# Patient Record
Sex: Female | Born: 1995 | Race: Black or African American | Hispanic: No | Marital: Single | Smoking: Current every day smoker
Health system: Southern US, Community
[De-identification: ages and names within clinical notes are randomized; demographics above are authoritative.]

## PROBLEM LIST (undated history)

## (undated) ENCOUNTER — Inpatient Hospital Stay (HOSPITAL_COMMUNITY): Payer: Medicaid Other

## (undated) DIAGNOSIS — D649 Anemia, unspecified: Secondary | ICD-10-CM

## (undated) DIAGNOSIS — D563 Thalassemia minor: Secondary | ICD-10-CM

## (undated) DIAGNOSIS — A599 Trichomoniasis, unspecified: Secondary | ICD-10-CM

## (undated) DIAGNOSIS — R569 Unspecified convulsions: Secondary | ICD-10-CM

## (undated) DIAGNOSIS — Z803 Family history of malignant neoplasm of breast: Secondary | ICD-10-CM

## (undated) DIAGNOSIS — F1210 Cannabis abuse, uncomplicated: Secondary | ICD-10-CM

## (undated) DIAGNOSIS — A749 Chlamydial infection, unspecified: Secondary | ICD-10-CM

## (undated) HISTORY — DX: Thalassemia minor: D56.3

## (undated) HISTORY — DX: Family history of malignant neoplasm of breast: Z80.3

## (undated) HISTORY — PX: NO PAST SURGERIES: SHX2092

---

## 2015-10-27 ENCOUNTER — Emergency Department (HOSPITAL_COMMUNITY)
Admission: EM | Admit: 2015-10-27 | Discharge: 2015-10-27 | Disposition: A | Discharge status: Home / Self Care | Payer: Medicaid Other | Attending: Emergency Medicine | Admitting: Emergency Medicine

## 2015-10-27 ENCOUNTER — Encounter (HOSPITAL_COMMUNITY): Payer: Self-pay | Admitting: *Deleted

## 2015-10-27 DIAGNOSIS — Z5321 Procedure and treatment not carried out due to patient leaving prior to being seen by health care provider: Secondary | ICD-10-CM | POA: Diagnosis not present

## 2015-10-27 DIAGNOSIS — F439 Reaction to severe stress, unspecified: Secondary | ICD-10-CM | POA: Insufficient documentation

## 2015-10-27 DIAGNOSIS — F17210 Nicotine dependence, cigarettes, uncomplicated: Secondary | ICD-10-CM | POA: Diagnosis not present

## 2015-10-27 LAB — COMPREHENSIVE METABOLIC PANEL
ALBUMIN: 3.8 g/dL (ref 3.5–5.0)
ALT: 16 U/L (ref 14–54)
ANION GAP: 6 (ref 5–15)
AST: 20 U/L (ref 15–41)
Alkaline Phosphatase: 35 U/L — ABNORMAL LOW (ref 38–126)
BUN: <5 mg/dL — ABNORMAL LOW (ref 6–20)
CALCIUM: 9.2 mg/dL (ref 8.9–10.3)
CO2: 24 mmol/L (ref 22–32)
Chloride: 108 mmol/L (ref 101–111)
Creatinine, Ser: 0.75 mg/dL (ref 0.44–1.00)
GFR calc non Af Amer: >60 mL/min (ref >60)
GLUCOSE: 89 mg/dL (ref 65–99)
POTASSIUM: 3.8 mmol/L (ref 3.5–5.1)
SODIUM: 138 mmol/L (ref 135–145)
TOTAL PROTEIN: 5.9 g/dL — AB (ref 6.5–8.1)
Total Bilirubin: 1.1 mg/dL (ref 0.3–1.2)

## 2015-10-27 LAB — CBC
HEMATOCRIT: 38.3 % (ref 36.0–46.0)
HEMOGLOBIN: 12.3 g/dL (ref 12.0–15.0)
MCH: 22.7 pg — AB (ref 26.0–34.0)
MCHC: 32.1 g/dL (ref 30.0–36.0)
MCV: 70.5 fL — ABNORMAL LOW (ref 78.0–100.0)
Platelets: 239 10*3/uL (ref 150–400)
RBC: 5.43 MIL/uL — AB (ref 3.87–5.11)
RDW: 15.8 % — AB (ref 11.5–15.5)
WBC: 4.9 10*3/uL (ref 4.0–10.5)

## 2015-10-27 LAB — I-STAT BETA HCG BLOOD, ED (MC, WL, AP ONLY)

## 2015-10-27 NOTE — ED Triage Notes (Signed)
Pt states she might be pregnant and stressed. Pt states she has the IUD but states it was suppose to be taken in February. Pt is c/o lower pelvic since Sunday night.

## 2015-10-27 NOTE — ED Notes (Signed)
Pt came to nurse first to advise she is leaving.  Advised to return if symptoms worsens or no improvement.

## 2015-12-21 ENCOUNTER — Emergency Department (HOSPITAL_COMMUNITY)
Admission: EM | Admit: 2015-12-21 | Discharge: 2015-12-21 | Disposition: A | Discharge status: Home / Self Care | Payer: Medicaid Other | Attending: Emergency Medicine | Admitting: Emergency Medicine

## 2015-12-21 ENCOUNTER — Encounter (HOSPITAL_COMMUNITY): Payer: Self-pay | Admitting: Emergency Medicine

## 2015-12-21 DIAGNOSIS — K920 Hematemesis: Secondary | ICD-10-CM

## 2015-12-21 DIAGNOSIS — K226 Gastro-esophageal laceration-hemorrhage syndrome: Secondary | ICD-10-CM | POA: Insufficient documentation

## 2015-12-21 DIAGNOSIS — R109 Unspecified abdominal pain: Secondary | ICD-10-CM | POA: Diagnosis not present

## 2015-12-21 LAB — URINALYSIS, ROUTINE W REFLEX MICROSCOPIC
BILIRUBIN URINE: NEGATIVE
Glucose, UA: NEGATIVE mg/dL
HGB URINE DIPSTICK: NEGATIVE
Ketones, ur: NEGATIVE mg/dL
Nitrite: NEGATIVE
PH: 7 (ref 5.0–8.0)
Protein, ur: NEGATIVE mg/dL
SPECIFIC GRAVITY, URINE: 1.029 (ref 1.005–1.030)

## 2015-12-21 LAB — COMPREHENSIVE METABOLIC PANEL
ALBUMIN: 3.6 g/dL (ref 3.5–5.0)
ALK PHOS: 43 U/L (ref 38–126)
ALT: 10 U/L — AB (ref 14–54)
AST: 19 U/L (ref 15–41)
Anion gap: 2 — ABNORMAL LOW (ref 5–15)
BUN: 9 mg/dL (ref 6–20)
CALCIUM: 8.5 mg/dL — AB (ref 8.9–10.3)
CO2: 26 mmol/L (ref 22–32)
CREATININE: 0.77 mg/dL (ref 0.44–1.00)
Chloride: 108 mmol/L (ref 101–111)
GFR calc Af Amer: >60 mL/min (ref >60)
GFR calc non Af Amer: >60 mL/min (ref >60)
GLUCOSE: 84 mg/dL (ref 65–99)
Potassium: 4 mmol/L (ref 3.5–5.1)
SODIUM: 136 mmol/L (ref 135–145)
Total Bilirubin: 0.5 mg/dL (ref 0.3–1.2)
Total Protein: 5.9 g/dL — ABNORMAL LOW (ref 6.5–8.1)

## 2015-12-21 LAB — URINE MICROSCOPIC-ADD ON: RBC / HPF: NONE SEEN RBC/hpf (ref 0–5)

## 2015-12-21 LAB — CBC
HCT: 36.8 % (ref 36.0–46.0)
HEMOGLOBIN: 12.2 g/dL (ref 12.0–15.0)
MCH: 22.9 pg — AB (ref 26.0–34.0)
MCHC: 33.2 g/dL (ref 30.0–36.0)
MCV: 69.2 fL — ABNORMAL LOW (ref 78.0–100.0)
Platelets: 291 10*3/uL (ref 150–400)
RBC: 5.32 MIL/uL — AB (ref 3.87–5.11)
RDW: 15.7 % — ABNORMAL HIGH (ref 11.5–15.5)
WBC: 8.4 10*3/uL (ref 4.0–10.5)

## 2015-12-21 LAB — LIPASE, BLOOD: Lipase: 33 U/L (ref 11–51)

## 2015-12-21 LAB — POC URINE PREG, ED: Preg Test, Ur: NEGATIVE

## 2015-12-21 MED ORDER — TRAMADOL HCL 50 MG PO TABS: 50.0000 mg | ORAL_TABLET | Freq: Four times a day (QID) | ORAL | 0 refills | Status: DC | PRN

## 2015-12-21 MED ORDER — OXYCODONE-ACETAMINOPHEN 5-325 MG PO TABS
1.0000 | ORAL_TABLET | Freq: Once | ORAL | Status: AC
  Administered 2015-12-21: 1 via ORAL
  Filled 2015-12-21: qty 1

## 2015-12-21 MED ORDER — ONDANSETRON HCL 4 MG PO TABS: 4.0000 mg | ORAL_TABLET | Freq: Four times a day (QID) | ORAL | 0 refills | Status: DC

## 2015-12-21 MED ORDER — ONDANSETRON 8 MG PO TBDP
8.0000 mg | ORAL_TABLET | Freq: Once | ORAL | Status: AC
  Administered 2015-12-21: 8 mg via ORAL
  Filled 2015-12-21: qty 1

## 2015-12-21 NOTE — ED Provider Notes (Signed)
[facility]-EMERGENCY DEPT Provider Note   CSN: [id number] Arrival date & time: 12/21/15  0155  By signing my name below, I, Alexis [name], attest that this documentation has been prepared under the direction and in the presence of Alexis Creasehristopher J Kolter Reaver, MD . Electronically Signed: Majel HomerPeyton [name], Scribe. 12/21/2015. 2:12 AM.  History   Chief Complaint Chief Complaint  Patient presents with  . Hematemesis  . Possible Pregnancy   The history is provided by the patient. No language interpreter was used.   HPI Comments: Alexis [name] is a 20 y.o. female who presents to the Emergency Department complaining of sudden onset, nausea and hematemesis that began at ~2 hours PTA. Pt reports she "got scared" when she saw the color of her vomit and tasted blood in her mouth so she decided to visit the ED. She also notes she has an IUD that is "due to come out;" however, she has been unable to have it removed due to a change in her insurance. She denies melena and hx of ulcers or GERD.  History reviewed. No pertinent past medical history.  There are no active problems to display for this patient.  History reviewed. No pertinent surgical history.  OB History    No data available     Home Medications    Prior to Admission medications   Medication Sig Start Date End Date Taking? Authorizing Provider  ondansetron (ZOFRAN) 4 MG tablet Take 1 tablet (4 mg total) by mouth every 6 (six) hours. 12/21/15   Alexis Creasehristopher J Jordany Russett, MD    Family History History reviewed. No pertinent family history.  Social History Social History  Substance Use Topics  . Smoking status: Current Some Day Smoker    Types: Cigarettes  . Smokeless tobacco: Never Used  . Alcohol use No   Allergies   Review of patient's allergies indicates no known allergies.   Review of Systems Review of Systems  Gastrointestinal: Positive for nausea.       +hematemesis   Physical Exam Updated Vital Signs BP 109/78   Pulse 86   Temp  97.8 F (36.6 C) (Oral)   Resp 18   SpO2 98%   Physical Exam  Constitutional: She is oriented to person, place, and time. She appears well-developed and well-nourished. No distress.  HENT:  Head: Normocephalic and atraumatic.  Right Ear: Hearing normal.  Left Ear: Hearing normal.  Nose: Nose normal.  Mouth/Throat: Oropharynx is clear and moist and mucous membranes are normal.  Eyes: Conjunctivae and EOM are normal. Pupils are equal, round, and reactive to light.  Neck: Normal range of motion. Neck supple.  Cardiovascular: Regular rhythm, S1 normal and S2 normal.  Exam reveals no gallop and no friction rub.   No murmur heard. Pulmonary/Chest: Effort normal and breath sounds normal. No respiratory distress. She exhibits no tenderness.  Abdominal: Soft. Normal appearance and bowel sounds are normal. There is no hepatosplenomegaly. There is no tenderness. There is no rebound, no guarding, no tenderness at McBurney's point and negative Murphy's sign. No hernia.  Musculoskeletal: Normal range of motion.  Neurological: She is alert and oriented to person, place, and time. She has normal strength. No cranial nerve deficit or sensory deficit. Coordination normal. GCS eye subscore is 4. GCS verbal subscore is 5. GCS motor subscore is 6.  Skin: Skin is warm, dry and intact. No rash noted. No cyanosis.  Psychiatric: She has a normal mood and affect. Her speech is normal and behavior is normal. Thought content normal.  Nursing  note and vitals reviewed.  ED Treatments / Results  Labs (all labs ordered are listed, but only abnormal results are displayed) Labs Reviewed  CBC - Abnormal; Notable for the following:       Result Value   RBC 5.32 (*)    MCV 69.2 (*)    MCH 22.9 (*)    RDW 15.7 (*)    All other components within normal limits  COMPREHENSIVE METABOLIC PANEL - Abnormal; Notable for the following:    Calcium 8.5 (*)    Total Protein 5.9 (*)    ALT 10 (*)    Anion gap 2 (*)    All  other components within normal limits  URINALYSIS, ROUTINE W REFLEX MICROSCOPIC (NOT AT [facility]) - Abnormal; Notable for the following:    APPearance CLOUDY (*)    Leukocytes, UA MODERATE (*)    All other components within normal limits  URINE MICROSCOPIC-ADD ON - Abnormal; Notable for the following:    Squamous Epithelial / LPF 6-30 (*)    Bacteria, UA FEW (*)    All other components within normal limits  URINE CULTURE  LIPASE, BLOOD  POC URINE PREG, ED    EKG  EKG Interpretation None       Radiology No results found.  Procedures Procedures (including critical care time)  Medications Ordered in ED Medications  ondansetron (ZOFRAN-ODT) disintegrating tablet 8 mg (8 mg Oral Given 12/21/15 0222)    DIAGNOSTIC STUDIES:  Oxygen Saturation is 98% on RA, normal by my interpretation.    COORDINATION OF CARE:  2:10 AM Discussed treatment plan with pt at bedside and pt agreed to plan.  Initial Impression / Assessment and Plan / ED Course  I have reviewed the triage vital signs and the nursing notes.  Pertinent labs & imaging results that were available during my care of the patient were reviewed by me and considered in my medical decision making (see chart for details).  Clinical Course    Patient presents to the emergency department for evaluation of hematemesis. Patient reports that she came acutely nauseated and had vomiting this evening. She reports red blood within the vomit. She has not had any coffee-ground emesis, denies melanotic stools. Patient complaining of some upper abdominal discomfort, but abdominal exam was benign and nontender. Patient mainly concerned about the fact that she was vomiting blood. Presentation is consistent with Mallory-Weiss tear. No further hematemesis here in the ER. Hemoglobin stable. No elevation of BUN to suggest gastric blood. Patient reassured, no further treatment necessary. Urinalysis appears contaminated, obtain urine culture. Treat  symptomatically with antiemetics.  Patient also expressing concern about an IUD that is in place. She reports that has been in place for 6 years. She would like to have the IUD removed. There is to be any acute complications at this time. Will refer to OB/GYN faculty practice for removal of IUD and further gynecologic evaluation.  I personally performed the services described in this documentation, which was scribed in my presence. The recorded information has been reviewed and is accurate.   Final Clinical Impressions(s) / ED Diagnoses   Final diagnoses:  Hematemesis with nausea  Mallory-Weiss tear    New Prescriptions New Prescriptions   ONDANSETRON (ZOFRAN) 4 MG TABLET    Take 1 tablet (4 mg total) by mouth every 6 (six) hours.     Alexis Creasehristopher J Shakala Marlatt, MD 12/21/15 [phone]

## 2015-12-21 NOTE — ED Triage Notes (Signed)
Per EMS ,pt. From home with complaint of  Nausea and Hematemesis which started 1130 last night. Pt. Stated that also experienced abdominal cramping . Pt. Stated that she had a home pregnancy test sometime in last week of Sept. and was  POSITIVE for pregnancy. Pt. Never seen an OB. Pt. Stated that she has an IUD x 6 years now. Denied bleeding. Denied cough ,nor fever.

## 2015-12-21 NOTE — ED Notes (Signed)
MD at bedside. 

## 2015-12-21 NOTE — ED Notes (Signed)
Bed: [id number] Expected date:  Expected time:  Means of arrival:  Comments: 20 yo F/ Vomiting

## 2015-12-22 LAB — URINE CULTURE

## 2016-02-22 ENCOUNTER — Inpatient Hospital Stay (HOSPITAL_COMMUNITY)
Admission: AD | Admit: 2016-02-22 | Discharge: 2016-02-23 | Disposition: A | Discharge status: Home / Self Care | Payer: Medicaid Other | Source: Ambulatory Visit | Attending: Obstetrics and Gynecology | Admitting: Obstetrics and Gynecology

## 2016-02-22 DIAGNOSIS — N939 Abnormal uterine and vaginal bleeding, unspecified: Secondary | ICD-10-CM | POA: Diagnosis present

## 2016-02-22 DIAGNOSIS — A599 Trichomoniasis, unspecified: Secondary | ICD-10-CM | POA: Insufficient documentation

## 2016-02-22 DIAGNOSIS — Z79899 Other long term (current) drug therapy: Secondary | ICD-10-CM | POA: Diagnosis not present

## 2016-02-22 DIAGNOSIS — F17210 Nicotine dependence, cigarettes, uncomplicated: Secondary | ICD-10-CM | POA: Insufficient documentation

## 2016-02-22 LAB — POCT PREGNANCY, URINE: Preg Test, Ur: NEGATIVE

## 2016-02-22 LAB — URINALYSIS, ROUTINE W REFLEX MICROSCOPIC
Bacteria, UA: NONE SEEN
Bilirubin Urine: NEGATIVE
GLUCOSE, UA: NEGATIVE mg/dL
HGB URINE DIPSTICK: NEGATIVE
Ketones, ur: NEGATIVE mg/dL
NITRITE: NEGATIVE
PH: 5 (ref 5.0–8.0)
PROTEIN: NEGATIVE mg/dL
Specific Gravity, Urine: 1.021 (ref 1.005–1.030)

## 2016-02-22 NOTE — MAU Note (Signed)
Presents with pain that has lasted since 02/04/2016 that has worsened this past weekend.  States she was experiencing a large amount of bleeding between 12/14-12/28 with large clots but is not currently experiencing any bleeding.  Reports she takes 2 pregnancy tests every other week since October that have all been positive.  States she continued to have a menstrual period with her previous pregnancy and has a history of previous miscarriage.  Pain reported in her lower abdomen, cervix, and lower back pain that is intermittent.  Last sexual intercourse was approximately 1 month ago.

## 2016-02-23 ENCOUNTER — Encounter: Payer: Self-pay | Admitting: Obstetrics and Gynecology

## 2016-02-23 ENCOUNTER — Other Ambulatory Visit: Payer: Self-pay | Admitting: Obstetrics and Gynecology

## 2016-02-23 DIAGNOSIS — A599 Trichomoniasis, unspecified: Secondary | ICD-10-CM | POA: Diagnosis not present

## 2016-02-23 DIAGNOSIS — N730 Acute parametritis and pelvic cellulitis: Secondary | ICD-10-CM | POA: Insufficient documentation

## 2016-02-23 LAB — WET PREP, GENITAL
CLUE CELLS WET PREP: NONE SEEN
SPERM: NONE SEEN
Yeast Wet Prep HPF POC: NONE SEEN

## 2016-02-23 LAB — GC/CHLAMYDIA PROBE AMP (~~LOC~~) NOT AT ARMC
CHLAMYDIA, DNA PROBE: POSITIVE — AB
Neisseria Gonorrhea: NEGATIVE

## 2016-02-23 MED ORDER — IBUPROFEN 600 MG PO TABS
600.0000 mg | ORAL_TABLET | Freq: Once | ORAL | Status: AC
  Administered 2016-02-23: 600 mg via ORAL
  Filled 2016-02-23: qty 1

## 2016-02-23 MED ORDER — METRONIDAZOLE 500 MG PO TABS
2000.0000 mg | ORAL_TABLET | Freq: Once | ORAL | Status: AC
  Administered 2016-02-23: 2000 mg via ORAL
  Filled 2016-02-23: qty 4

## 2016-02-23 MED ORDER — AZITHROMYCIN 500 MG PO TABS: 1000.0000 mg | ORAL_TABLET | Freq: Once | ORAL | 0 refills | Status: AC

## 2016-02-23 NOTE — Discharge Instructions (Signed)
Trichomoniasis °Trichomoniasis is an infection caused by an organism called Trichomonas. The infection can affect both women and men. In women, the outer female genitalia and the vagina are affected. In men, the penis is mainly affected, but the prostate and other reproductive organs can also be involved. Trichomoniasis is a sexually transmitted infection (STI) and is most often passed to another person through sexual contact.  °RISK FACTORS °Having unprotected sexual intercourse. °Having sexual intercourse with an infected partner. °SIGNS AND SYMPTOMS  °Symptoms of trichomoniasis in women include: °Abnormal gray-green frothy vaginal discharge. °Itching and irritation of the vagina. °Itching and irritation of the area outside the vagina. °Symptoms of trichomoniasis in men include:  °Penile discharge with or without pain. °Pain during urination. This results from inflammation of the urethra. °DIAGNOSIS  °Trichomoniasis may be found during a Pap test or physical exam. Your health care provider may use one of the following methods to help diagnose this infection: °Testing the pH of the vagina with a test tape. °Using a vaginal swab test that checks for the Trichomonas organism. A test is available that provides results within a few minutes. °Examining a urine sample. °Testing vaginal secretions. °Your health care provider may test you for other STIs, including HIV. °TREATMENT  °You may be given medicine to fight the infection. Women should inform their health care provider if they could be or are pregnant. Some medicines used to treat the infection should not be taken during pregnancy. °Your health care provider may recommend over-the-counter medicines or creams to decrease itching or irritation. °Your sexual partner will need to be treated if infected. °Your health care provider may test you for infection again 3 months after treatment. °HOME CARE INSTRUCTIONS  °Take medicines only as directed by your health care  provider. °Take over-the-counter medicine for itching or irritation as directed by your health care provider. °Do not have sexual intercourse while you have the infection. °Women should not douche or wear tampons while they have the infection. °Discuss your infection with your partner. Your partner may have gotten the infection from you, or you may have gotten it from your partner. °Have your sex partner get examined and treated if necessary. °Practice safe, informed, and protected sex. °See your health care provider for other STI testing. °SEEK MEDICAL CARE IF:  °You still have symptoms after you finish your medicine. °You develop abdominal pain. °You have pain when you urinate. °You have bleeding after sexual intercourse. °You develop a rash. °Your medicine makes you sick or makes you throw up (vomit). °MAKE SURE YOU: °Understand these instructions. °Will watch your condition. °Will get help right away if you are not doing well or get worse. °This information is not intended to replace advice given to you by your health care provider. Make sure you discuss any questions you have with your health care provider. °Document Released: 08/03/2000 Document Revised: 02/28/2014 Document Reviewed: 11/19/2012 °Elsevier Interactive Patient Education © 2017 [employer] °  °

## 2016-02-23 NOTE — MAU Provider Note (Signed)
  History     CSN: [id number]  Arrival date and time: 02/22/16 2250   First Provider Initiated Contact with Patient 02/23/16 0002      Chief Complaint  Patient presents with  . Possible Pregnancy  . Vaginal Bleeding  . Abdominal Pain   Patient is a 21 year old G2 P1 who presents with concerns for miscarriage. She reports she has taken pregnancy tests every 2 weeks at home and they've been positive. She reports she started bleeding 2-1/2 weeks ago with some mild spotting. Since then has become heavy where she was passing clots and now she is having no bleeding. She reports diffuse abdominal pain. She reports his worst in the left lower quadrant. She reports normal bowel movement this afternoon that did not require straining. She reports last sexual contact was with a known partner approximately 1 month ago without protection.    OB History    No data available      No past medical history on file.  No past surgical history on file.  No family history on file.  Social History  Substance Use Topics  . Smoking status: Current Some Day Smoker    Types: Cigarettes  . Smokeless tobacco: Never Used  . Alcohol use No    Allergies: No Known Allergies  Prescriptions Prior to Admission  Medication Sig Dispense Refill Last Dose  . ondansetron (ZOFRAN) 4 MG tablet Take 1 tablet (4 mg total) by mouth every 6 (six) hours. 12 tablet 0   . traMADol (ULTRAM) 50 MG tablet Take 1 tablet (50 mg total) by mouth every 6 (six) hours as needed. 15 tablet 0     Review of Systems  Constitutional: Negative for chills and fever.  Eyes: Negative for blurred vision and double vision.  Respiratory: Negative for cough and shortness of breath.   Cardiovascular: Negative for chest pain and palpitations.  Gastrointestinal: Positive for abdominal pain. Negative for constipation, diarrhea, heartburn, nausea and vomiting.  Genitourinary: Negative for dysuria, frequency and urgency.  Musculoskeletal:  Positive for back pain. Negative for myalgias and neck pain.  Neurological: Negative for dizziness and headaches.   Physical Exam   Blood pressure 113/62, pulse 90, temperature 98.4 F (36.9 C), temperature source Oral, resp. rate 18, height 5\' 4"  (1.626 m), weight 156 lb (70.8 kg).  Physical Exam  Constitutional: She is oriented to person, place, and time. She appears well-developed and well-nourished.  HENT:  Head: Atraumatic.  Cardiovascular: Normal rate and intact distal pulses.   Respiratory: Effort normal. No respiratory distress.  GI: Soft. Bowel sounds are normal. She exhibits no distension. There is no rebound and no guarding.  Diffuse abdominal tenderness worse in the left lower quadrant  Genitourinary:  Genitourinary Comments: Cervical motion tenderness, copious white thin discharge healthy pink vaginal mucosa and normal-appearing cervix  Musculoskeletal: Normal range of motion. She exhibits no edema.  Neurological: She is alert and oriented to person, place, and time.    MAU Course  Procedures  MDM In MA U patient underwent exam and was found to have some minimal cervical motion tenderness. Wet prep as well as gonorrhea and chlamydia were collected. Wet prep revealed Trichomonas and patient revealed 2 g of Flagyl in MA U.  Assessment and Plan  #1: Trichomonas, informed patient that she had STD Trichomonas. Treated with Flagyl in MA U.  Ernestina Pennaicholas Cynai Skeens 02/23/2016, 12:17 AM

## 2016-02-24 ENCOUNTER — Telehealth (HOSPITAL_COMMUNITY): Payer: Self-pay | Admitting: *Deleted

## 2016-02-24 NOTE — Telephone Encounter (Signed)
Pt. Reached to give positive Chlaymidis results.  Perscription called to [facility] [address]. Pt. Notified to refrain from intercourse x2 weeks.  Health dept. Form completed.

## 2016-03-11 ENCOUNTER — Encounter (HOSPITAL_COMMUNITY): Payer: Self-pay | Admitting: *Deleted

## 2016-03-11 ENCOUNTER — Ambulatory Visit (HOSPITAL_COMMUNITY)
Admission: EM | Admit: 2016-03-11 | Discharge: 2016-03-11 | Disposition: A | Discharge status: Home / Self Care | Payer: Medicaid Other | Attending: Family Medicine | Admitting: Family Medicine

## 2016-03-11 ENCOUNTER — Ambulatory Visit (INDEPENDENT_AMBULATORY_CARE_PROVIDER_SITE_OTHER): Payer: Medicaid Other

## 2016-03-11 DIAGNOSIS — S6000XA Contusion of unspecified finger without damage to nail, initial encounter: Secondary | ICD-10-CM | POA: Diagnosis not present

## 2016-03-11 DIAGNOSIS — G43001 Migraine without aura, not intractable, with status migrainosus: Secondary | ICD-10-CM

## 2016-03-11 MED ORDER — KETOROLAC TROMETHAMINE 60 MG/2ML IM SOLN
INTRAMUSCULAR | Status: AC
  Filled 2016-03-11: qty 2

## 2016-03-11 MED ORDER — NAPROXEN 500 MG PO TABS: 500.0000 mg | ORAL_TABLET | Freq: Two times a day (BID) | ORAL | 0 refills | Status: DC

## 2016-03-11 MED ORDER — KETOROLAC TROMETHAMINE 60 MG/2ML IM SOLN
60.0000 mg | Freq: Once | INTRAMUSCULAR | Status: AC
  Administered 2016-03-11: 60 mg via INTRAMUSCULAR

## 2016-03-11 NOTE — ED Triage Notes (Signed)
Patient reports falling yesterday and injuring right pinky finger, states after that she started having severe migraine headache. Patient denies hx of migraines. Patient with noted swelling at proximal pinky joint. No relief with OTC pain medications.

## 2016-03-11 NOTE — ED Provider Notes (Signed)
CSN: [id number]     Arrival date & time 03/11/16  1430 History   First MD Initiated Contact with Patient 03/11/16 1618     Chief Complaint  Patient presents with  . Migraine  . Fall  . Hand Pain   (Consider location/radiation/quality/duration/timing/severity/associated sxs/prior Treatment) Patient c/o right pinky discomfort after falling and injuring right pinky finger.  Patient is c/o headache.   The history is provided by the patient.  Migraine  This is a new problem. The problem occurs constantly. The problem has not changed since onset.Associated symptoms include headaches. Nothing aggravates the symptoms. Nothing relieves the symptoms. She has tried nothing for the symptoms.  Fall  Associated symptoms include headaches.  Hand Pain  Associated symptoms include headaches.    History reviewed. No pertinent past medical history. History reviewed. No pertinent surgical history. History reviewed. No pertinent family history. Social History  Substance Use Topics  . Smoking status: Current Some Day Smoker    Types: Cigarettes  . Smokeless tobacco: Never Used  . Alcohol use No   OB History    No data available     Review of Systems  Constitutional: Negative.   HENT: Negative.   Eyes: Negative.   Respiratory: Negative.   Cardiovascular: Negative.   Genitourinary: Negative.   Musculoskeletal: Positive for arthralgias.  Neurological: Positive for headaches.    Allergies  Patient has no known allergies.  Home Medications   Prior to Admission medications   Medication Sig Start Date End Date Taking? Authorizing Provider  naproxen (NAPROSYN) 500 MG tablet Take 1 tablet (500 mg total) by mouth 2 (two) times daily with a meal. 03/11/16   Deatra CanterWilliam J Oxford, FNP  ondansetron (ZOFRAN) 4 MG tablet Take 1 tablet (4 mg total) by mouth every 6 (six) hours. 12/21/15   Gilda Creasehristopher J Pollina, MD  traMADol (ULTRAM) 50 MG tablet Take 1 tablet (50 mg total) by mouth every 6 (six) hours as  needed. 12/21/15   Gilda Creasehristopher J Pollina, MD   Meds Ordered and Administered this Visit   Medications  ketorolac (TORADOL) injection 60 mg (60 mg Intramuscular Given 03/11/16 1644)    BP 120/71 (BP Location: Right Arm)   Pulse 78   Temp 98 F (36.7 C) (Oral)   Resp 19   LMP 03/09/2016   SpO2 100%  No data found.   Physical Exam  Constitutional: She is oriented to person, place, and time. She appears well-developed and well-nourished.  HENT:  Head: Normocephalic and atraumatic.  Eyes: Conjunctivae and EOM are normal. Pupils are equal, round, and reactive to light.  Neck: Normal range of motion. Neck supple.  Cardiovascular: Normal rate, regular rhythm and normal heart sounds.   Pulmonary/Chest: Effort normal and breath sounds normal.  Musculoskeletal: She exhibits tenderness.  Right pinky finger with swelling and TTP MIP joint.  Neurological: She is alert and oriented to person, place, and time.  Nursing note and vitals reviewed.   Urgent Care Course     Procedures (including critical care time)  Labs Review Labs Reviewed - No data to display  Imaging Review Dg Finger Little Right  Result Date: 03/11/2016 CLINICAL DATA:  Slipped on ice and injured finger yesterday. Pain and swelling. EXAM: RIGHT LITTLE FINGER 2+V COMPARISON:  None. FINDINGS: There is no evidence of fracture or dislocation. There is no evidence of arthropathy or other focal bone abnormality. Soft tissues are unremarkable. IMPRESSION: Negative. Electronically Signed   By: Awilda Metroourtnay  Bloomer M.D.   On: 03/11/2016 16:33  Visual Acuity Review  Right Eye Distance:   Left Eye Distance:   Bilateral Distance:    Right Eye Near:   Left Eye Near:    Bilateral Near:         MDM   1. Migraine without aura and with status migrainosus, not intractable   2. Contusion of pinky finger  Buddy tape Naprosyn 500mg  one po bid x 10 days Toradol 60mg  IM       Deatra Canter, FNP 03/11/16 1954

## 2016-03-21 ENCOUNTER — Encounter (HOSPITAL_COMMUNITY): Payer: Self-pay | Admitting: Emergency Medicine

## 2016-03-21 DIAGNOSIS — F17210 Nicotine dependence, cigarettes, uncomplicated: Secondary | ICD-10-CM | POA: Diagnosis not present

## 2016-03-21 DIAGNOSIS — Z5321 Procedure and treatment not carried out due to patient leaving prior to being seen by health care provider: Secondary | ICD-10-CM | POA: Insufficient documentation

## 2016-03-21 DIAGNOSIS — R51 Headache: Secondary | ICD-10-CM | POA: Insufficient documentation

## 2016-03-21 NOTE — ED Triage Notes (Signed)
Patient reports headache onset last week unrelieved by OTC pain medication , denies head injury , no emesis or photophobia .

## 2016-03-22 ENCOUNTER — Emergency Department (HOSPITAL_COMMUNITY)
Admission: EM | Admit: 2016-03-22 | Discharge: 2016-03-22 | Disposition: A | Discharge status: Home / Self Care | Payer: Medicaid Other | Attending: Dermatology | Admitting: Dermatology

## 2016-05-14 ENCOUNTER — Encounter (HOSPITAL_COMMUNITY): Payer: Self-pay | Admitting: Emergency Medicine

## 2016-05-14 ENCOUNTER — Ambulatory Visit (HOSPITAL_COMMUNITY)
Admission: EM | Admit: 2016-05-14 | Discharge: 2016-05-14 | Disposition: A | Discharge status: Home / Self Care | Payer: Medicaid Other | Attending: Internal Medicine | Admitting: Internal Medicine

## 2016-05-14 DIAGNOSIS — R109 Unspecified abdominal pain: Secondary | ICD-10-CM | POA: Diagnosis not present

## 2016-05-14 DIAGNOSIS — F17210 Nicotine dependence, cigarettes, uncomplicated: Secondary | ICD-10-CM | POA: Diagnosis not present

## 2016-05-14 DIAGNOSIS — N898 Other specified noninflammatory disorders of vagina: Secondary | ICD-10-CM | POA: Diagnosis not present

## 2016-05-14 DIAGNOSIS — Z79899 Other long term (current) drug therapy: Secondary | ICD-10-CM | POA: Diagnosis not present

## 2016-05-14 DIAGNOSIS — Z202 Contact with and (suspected) exposure to infections with a predominantly sexual mode of transmission: Secondary | ICD-10-CM | POA: Insufficient documentation

## 2016-05-14 DIAGNOSIS — Z3202 Encounter for pregnancy test, result negative: Secondary | ICD-10-CM

## 2016-05-14 DIAGNOSIS — N941 Unspecified dyspareunia: Secondary | ICD-10-CM | POA: Diagnosis not present

## 2016-05-14 LAB — POCT URINALYSIS DIP (DEVICE)
Bilirubin Urine: NEGATIVE
Glucose, UA: NEGATIVE mg/dL
HGB URINE DIPSTICK: NEGATIVE
KETONES UR: NEGATIVE mg/dL
Leukocytes, UA: NEGATIVE
Nitrite: NEGATIVE
PROTEIN: 30 mg/dL — AB
SPECIFIC GRAVITY, URINE: 1.025 (ref 1.005–1.030)
Urobilinogen, UA: >=8 mg/dL (ref 0.0–1.0)
pH: 6.5 (ref 5.0–8.0)

## 2016-05-14 LAB — POCT PREGNANCY, URINE: Preg Test, Ur: NEGATIVE

## 2016-05-14 MED ORDER — AZITHROMYCIN 250 MG PO TABS
1000.0000 mg | ORAL_TABLET | Freq: Once | ORAL | Status: AC
  Administered 2016-05-14: 1000 mg via ORAL

## 2016-05-14 MED ORDER — CEFTRIAXONE SODIUM 250 MG IJ SOLR
250.0000 mg | Freq: Once | INTRAMUSCULAR | Status: AC
  Administered 2016-05-14: 250 mg via INTRAMUSCULAR

## 2016-05-14 MED ORDER — LIDOCAINE HCL (PF) 1 % IJ SOLN
INTRAMUSCULAR | Status: AC
  Filled 2016-05-14: qty 2

## 2016-05-14 MED ORDER — METRONIDAZOLE 500 MG PO TABS: 500.0000 mg | ORAL_TABLET | Freq: Two times a day (BID) | ORAL | 0 refills | Status: AC

## 2016-05-14 MED ORDER — AZITHROMYCIN 250 MG PO TABS
ORAL_TABLET | ORAL | Status: AC
  Filled 2016-05-14: qty 4

## 2016-05-14 MED ORDER — CEFTRIAXONE SODIUM 250 MG IJ SOLR
INTRAMUSCULAR | Status: AC
  Filled 2016-05-14: qty 250

## 2016-05-14 NOTE — ED Notes (Signed)
Call back number verified and updated in EPIC... Adv pt to not have SI until lab results comeback neg.... Also adv pt lab results will be on MyChart; instructions given .... Pt verb understanding.   

## 2016-05-14 NOTE — ED Triage Notes (Signed)
Patient wants "check up".  Pain in low abdominal area.  Pain for 2 weeks.  Reports vaginal discharge.  Denies urinary symptoms

## 2016-05-14 NOTE — ED Provider Notes (Signed)
CSN: [id number]     Arrival date & time 05/14/16  1619 History   None    Chief Complaint  Patient presents with  . Exposure to STD   (Consider location/radiation/quality/duration/timing/severity/associated sxs/prior Treatment) 20y.o. female presents with lower abdominal pain, dyspareunia and  vaginal discharge that patient describes and silver and smells X 2 weeks. Patient states "I just want to get checked out" Patient endorses unprotected sex with a man who has unprotected sex with other women. Condition is acute in nature. Condition is made better by acute. Condition is made worse by nothing. Patient denies any treatment.  prior to there arrival at this facility. Patient denies any nausea, vomiting diarrhea, pain with urination, physical vaginal issues or fevers. Last menstrual cycle now. Patient declines pelvic exam at time of evaluation        History reviewed. No pertinent past medical history. History reviewed. No pertinent surgical history. No family history on file. Social History  Substance Use Topics  . Smoking status: Current Some Day Smoker    Types: Cigarettes  . Smokeless tobacco: Never Used  . Alcohol use No   OB History    No data available     Review of Systems  Constitutional: Negative for chills and fever.  HENT: Negative for ear pain and sore throat.   Eyes: Negative for pain and visual disturbance.  Respiratory: Negative for cough and shortness of breath.   Cardiovascular: Negative for chest pain and palpitations.  Gastrointestinal: Negative for abdominal pain and vomiting.  Genitourinary: Positive for dyspareunia and vaginal discharge. Negative for dysuria and hematuria.  Musculoskeletal: Negative for arthralgias and back pain.  Skin: Negative for color change and rash.  Neurological: Negative for seizures and syncope.  All other systems reviewed and are negative.   Allergies  Patient has no known allergies.  Home Medications   Prior to Admission  medications   Medication Sig Start Date End Date Taking? Authorizing Provider  metroNIDAZOLE (FLAGYL) 500 MG tablet Take 1 tablet (500 mg total) by mouth 2 (two) times daily. 05/14/16 05/24/16  Alene Mires, NP  naproxen (NAPROSYN) 500 MG tablet Take 1 tablet (500 mg total) by mouth 2 (two) times daily with a meal. 03/11/16   Deatra Canter, FNP  ondansetron (ZOFRAN) 4 MG tablet Take 1 tablet (4 mg total) by mouth every 6 (six) hours. 12/21/15   Gilda Crease, MD  traMADol (ULTRAM) 50 MG tablet Take 1 tablet (50 mg total) by mouth every 6 (six) hours as needed. 12/21/15   Gilda Crease, MD   Meds Ordered and Administered this Visit   Medications  cefTRIAXone (ROCEPHIN) injection 250 mg (250 mg Intramuscular Given 05/14/16 1734)  azithromycin (ZITHROMAX) tablet 1,000 mg (1,000 mg Oral Given 05/14/16 1734)    BP 116/69 (BP Location: Left Arm)   Pulse 66   Temp 98.8 F (37.1 C) (Oral)   Resp 18   LMP 05/14/2016   SpO2 100%  No data found.   Physical Exam  Constitutional: She is oriented to person, place, and time. She appears well-developed and well-nourished.  HENT:  Head: Normocephalic and atraumatic.  Eyes: Conjunctivae are normal.  Neck: Normal range of motion.  Pulmonary/Chest: Effort normal.  Neurological: She is alert and oriented to person, place, and time.  Psychiatric: She has a normal mood and affect.  Nursing note and vitals reviewed.   Urgent Care Course     Procedures (including critical care time)  Labs Review Labs Reviewed  POCT  URINALYSIS DIP (DEVICE) - Abnormal; Notable for the following:       Result Value   Protein, ur 30 (*)    All other components within normal limits  POCT PREGNANCY, URINE  URINE CYTOLOGY ANCILLARY ONLY    Imaging Review No results found.   Visual Acuity Review  Right Eye Distance:   Left Eye Distance:   Bilateral Distance:    Right Eye Near:   Left Eye Near:    Bilateral Near:         MDM    1. STD exposure       Alene MiresJennifer C Omohundro, NP 05/14/16 1750

## 2016-05-16 LAB — URINE CYTOLOGY ANCILLARY ONLY
CHLAMYDIA, DNA PROBE: NEGATIVE
Neisseria Gonorrhea: NEGATIVE
Trichomonas: NEGATIVE

## 2016-05-18 LAB — URINE CYTOLOGY ANCILLARY ONLY: Candida vaginitis: NEGATIVE

## 2016-06-02 ENCOUNTER — Ambulatory Visit (HOSPITAL_COMMUNITY)
Admission: EM | Admit: 2016-06-02 | Discharge: 2016-06-02 | Disposition: A | Discharge status: Home / Self Care | Payer: Medicaid Other | Attending: Family Medicine | Admitting: Family Medicine

## 2016-06-02 ENCOUNTER — Encounter (HOSPITAL_COMMUNITY): Payer: Self-pay | Admitting: Emergency Medicine

## 2016-06-02 DIAGNOSIS — K047 Periapical abscess without sinus: Secondary | ICD-10-CM

## 2016-06-02 DIAGNOSIS — K029 Dental caries, unspecified: Secondary | ICD-10-CM

## 2016-06-02 DIAGNOSIS — K0889 Other specified disorders of teeth and supporting structures: Secondary | ICD-10-CM | POA: Diagnosis not present

## 2016-06-02 MED ORDER — HYDROCODONE-ACETAMINOPHEN 5-325 MG PO TABS: 1.0000 | ORAL_TABLET | ORAL | 0 refills | Status: DC | PRN

## 2016-06-02 MED ORDER — AMOXICILLIN 500 MG PO CAPS: 1000.0000 mg | ORAL_CAPSULE | Freq: Two times a day (BID) | ORAL | 0 refills | Status: DC

## 2016-06-02 NOTE — ED Triage Notes (Signed)
Onset Tuesday of dental pain.  Pain is in the area where top and bottom jaw meet

## 2016-06-02 NOTE — ED Provider Notes (Signed)
CSN: [id number]     Arrival date & time 06/02/16  0957 History   First MD Initiated Contact with Patient 06/02/16 1047     Chief Complaint  Patient presents with  . Dental Pain   (Consider location/radiation/quality/duration/timing/severity/associated sxs/prior Treatment) 21 year old female complaining of toothache for 2 days. She states there is fluid draining behind her ear as well. Today's is located in the left upper jaw near the third molar. She has no tenderness. She has taken Naprosyn, ibuprofen and Tylenol which do not help.      History reviewed. No pertinent past medical history. History reviewed. No pertinent surgical history. No family history on file. Social History  Substance Use Topics  . Smoking status: Current Some Day Smoker    Types: Cigarettes  . Smokeless tobacco: Never Used  . Alcohol use No   OB History    No data available     Review of Systems  Constitutional: Negative.   HENT: Positive for dental problem. Negative for congestion and sore throat.   Eyes: Negative.   Respiratory: Negative.   Neurological: Negative.   All other systems reviewed and are negative.   Allergies  Patient has no known allergies.  Home Medications   Prior to Admission medications   Medication Sig Start Date End Date Taking? Authorizing Provider  naproxen (NAPROSYN) 500 MG tablet Take 1 tablet (500 mg total) by mouth 2 (two) times daily with a meal. 03/11/16  Yes Deatra Canter, FNP  amoxicillin (AMOXIL) 500 MG capsule Take 2 capsules (1,000 mg total) by mouth 2 (two) times daily. 06/02/16   Hayden Rasmussen, NP  HYDROcodone-acetaminophen (NORCO/VICODIN) 5-325 MG tablet Take 1 tablet by mouth every 4 (four) hours as needed. 06/02/16   Hayden Rasmussen, NP  ondansetron (ZOFRAN) 4 MG tablet Take 1 tablet (4 mg total) by mouth every 6 (six) hours. 12/21/15   Gilda Crease, MD  traMADol (ULTRAM) 50 MG tablet Take 1 tablet (50 mg total) by mouth every 6 (six) hours as needed.  12/21/15   Gilda Crease, MD   Meds Ordered and Administered this Visit  Medications - No data to display  BP 113/65 (BP Location: Right Arm)   Pulse 60   Temp 98.8 F (37.1 C) (Oral)   Resp 18   LMP 05/14/2016   SpO2 100%  No data found.   Physical Exam  Constitutional: She appears well-developed and well-nourished. No distress.  HENT:  Right TM obstructed by cerumen. Left TM appears normal. Normal light reflex, no bulging, erythema.  The left upper third molar with surrounding swelling of the gingiva. No actual abscess seen. Positive for dental tenderness and soft tissue tenderness around the tooth. In general very poor dental repair multiple eroding, cavernous teeth.  Eyes: EOM are normal.  Neck: Normal range of motion. Neck supple.  Cardiovascular: Normal rate.   Lymphadenopathy:    She has no cervical adenopathy.  Psychiatric: She has a normal mood and affect.  Nursing note and vitals reviewed.   Urgent Care Course     Procedures (including critical care time)  Labs Review Labs Reviewed - No data to display  Imaging Review No results found.   Visual Acuity Review  Right Eye Distance:   Left Eye Distance:   Bilateral Distance:    Right Eye Near:   Left Eye Near:    Bilateral Near:         MDM   1. Pain, dental   2. Infected dental caries  Take medication as directed. Call Dentist for appointment is sent is possible. Meds ordered this encounter  Medications  . amoxicillin (AMOXIL) 500 MG capsule    Sig: Take 2 capsules (1,000 mg total) by mouth 2 (two) times daily.    Dispense:  32 capsule    Refill:  0    Order Specific Question:   Supervising Provider    Answer:   Elvina Sidle [[id number]]  . HYDROcodone-acetaminophen (NORCO/VICODIN) 5-325 MG tablet    Sig: Take 1 tablet by mouth every 4 (four) hours as needed.    Dispense:  15 tablet    Refill:  0    Order Specific Question:   Supervising Provider    Answer:   Lonia Blood       Hayden Rasmussen, NP 06/02/16 1100    Hayden Rasmussen, NP 06/02/16 1105

## 2016-06-02 NOTE — Discharge Instructions (Signed)
Take medication as directed. Call Dentist for appointment is sent is possible.

## 2016-06-13 ENCOUNTER — Encounter (HOSPITAL_COMMUNITY): Payer: Self-pay

## 2016-06-13 ENCOUNTER — Emergency Department (HOSPITAL_COMMUNITY)
Admission: EM | Admit: 2016-06-13 | Discharge: 2016-06-13 | Disposition: A | Discharge status: Home / Self Care | Payer: Medicaid Other | Attending: Emergency Medicine | Admitting: Emergency Medicine

## 2016-06-13 DIAGNOSIS — M791 Myalgia, unspecified site: Secondary | ICD-10-CM

## 2016-06-13 DIAGNOSIS — F17210 Nicotine dependence, cigarettes, uncomplicated: Secondary | ICD-10-CM | POA: Insufficient documentation

## 2016-06-13 DIAGNOSIS — E860 Dehydration: Secondary | ICD-10-CM | POA: Insufficient documentation

## 2016-06-13 DIAGNOSIS — R42 Dizziness and giddiness: Secondary | ICD-10-CM | POA: Diagnosis present

## 2016-06-13 LAB — URINALYSIS, ROUTINE W REFLEX MICROSCOPIC
BILIRUBIN URINE: NEGATIVE
Glucose, UA: NEGATIVE mg/dL
Hgb urine dipstick: NEGATIVE
Ketones, ur: 5 mg/dL — AB
LEUKOCYTES UA: NEGATIVE
Nitrite: NEGATIVE
Protein, ur: NEGATIVE mg/dL
SPECIFIC GRAVITY, URINE: 1.027 (ref 1.005–1.030)
pH: 5 (ref 5.0–8.0)

## 2016-06-13 LAB — LIPASE, BLOOD: LIPASE: 30 U/L (ref 11–51)

## 2016-06-13 LAB — CBC WITH DIFFERENTIAL/PLATELET
Basophils Absolute: 0 10*3/uL (ref 0.0–0.1)
Basophils Relative: 1 %
EOS ABS: 0.1 10*3/uL (ref 0.0–0.7)
EOS PCT: 3 %
HCT: 35.7 % — ABNORMAL LOW (ref 36.0–46.0)
Hemoglobin: 11.7 g/dL — ABNORMAL LOW (ref 12.0–15.0)
LYMPHS ABS: 1.8 10*3/uL (ref 0.7–4.0)
Lymphocytes Relative: 45 %
MCH: 22.5 pg — ABNORMAL LOW (ref 26.0–34.0)
MCHC: 32.8 g/dL (ref 30.0–36.0)
MCV: 68.7 fL — AB (ref 78.0–100.0)
MONO ABS: 0.2 10*3/uL (ref 0.1–1.0)
Monocytes Relative: 6 %
Neutro Abs: 2 10*3/uL (ref 1.7–7.7)
Neutrophils Relative %: 45 %
PLATELETS: 220 10*3/uL (ref 150–400)
RBC: 5.2 MIL/uL — AB (ref 3.87–5.11)
RDW: 15.1 % (ref 11.5–15.5)
WBC: 4.1 10*3/uL (ref 4.0–10.5)

## 2016-06-13 LAB — COMPREHENSIVE METABOLIC PANEL
ALT: 9 U/L — ABNORMAL LOW (ref 14–54)
AST: 18 U/L (ref 15–41)
Albumin: 3.6 g/dL (ref 3.5–5.0)
Alkaline Phosphatase: 39 U/L (ref 38–126)
Anion gap: 8 (ref 5–15)
BUN: 6 mg/dL (ref 6–20)
CHLORIDE: 107 mmol/L (ref 101–111)
CO2: 24 mmol/L (ref 22–32)
CREATININE: 0.68 mg/dL (ref 0.44–1.00)
Calcium: 8.7 mg/dL — ABNORMAL LOW (ref 8.9–10.3)
GLUCOSE: 87 mg/dL (ref 65–99)
POTASSIUM: 3.5 mmol/L (ref 3.5–5.1)
SODIUM: 139 mmol/L (ref 135–145)
Total Bilirubin: 1.2 mg/dL (ref 0.3–1.2)
Total Protein: 6 g/dL — ABNORMAL LOW (ref 6.5–8.1)

## 2016-06-13 LAB — I-STAT BETA HCG BLOOD, ED (MC, WL, AP ONLY): I-stat hCG, quantitative: <5 m[IU]/mL (ref <5)

## 2016-06-13 MED ORDER — IBUPROFEN 600 MG PO TABS: 600.0000 mg | ORAL_TABLET | Freq: Three times a day (TID) | ORAL | 0 refills | Status: DC | PRN

## 2016-06-13 MED ORDER — KETOROLAC TROMETHAMINE 15 MG/ML IJ SOLN
15.0000 mg | Freq: Once | INTRAMUSCULAR | Status: AC
  Administered 2016-06-13: 15 mg via INTRAVENOUS
  Filled 2016-06-13: qty 1

## 2016-06-13 MED ORDER — DIPHENHYDRAMINE HCL 50 MG/ML IJ SOLN
25.0000 mg | Freq: Once | INTRAMUSCULAR | Status: AC
  Administered 2016-06-13: 25 mg via INTRAVENOUS
  Filled 2016-06-13: qty 1

## 2016-06-13 MED ORDER — HYDROCODONE-ACETAMINOPHEN 5-325 MG PO TABS
2.0000 | ORAL_TABLET | Freq: Once | ORAL | Status: AC
  Administered 2016-06-13: 2 via ORAL
  Filled 2016-06-13: qty 2

## 2016-06-13 MED ORDER — SODIUM CHLORIDE 0.9 % IV BOLUS (SEPSIS)
1000.0000 mL | Freq: Once | INTRAVENOUS | Status: AC
Start: 2016-06-13 — End: 2016-06-13
  Administered 2016-06-13: 1000 mL via INTRAVENOUS

## 2016-06-13 MED ORDER — METOCLOPRAMIDE HCL 5 MG/ML IJ SOLN
10.0000 mg | Freq: Once | INTRAMUSCULAR | Status: AC
  Administered 2016-06-13: 10 mg via INTRAVENOUS
  Filled 2016-06-13: qty 2

## 2016-06-13 NOTE — ED Triage Notes (Signed)
Pt states she donated plasma yesterday and has been having right side pain since. Pt reports the pain radiates up and down her right side. Pt ambulatory.

## 2016-06-13 NOTE — ED Provider Notes (Signed)
[facility] Provider Note   CSN: [id number] Arrival date & time: 06/13/16  4098     History   Chief Complaint Chief Complaint  Patient presents with  . Pain    HPI Alexis [name] is a 21 y.o. female.  HPI   21 yo F who presents with right sided pain. Pt was in usual state of health until yesterday afternoon. She donated plasma via her right AC. After donating, she had a mild headache and lightheadedness. This lightheadedness persisted throughout the day and this morning, she has developed sharp, stabbing right sided pain that radiates from her right arm to her right leg. It shoots across her abdomen as well. No nausea or vomiting. No syncope. No chest pain. She has never had similar symptoms. She does endorse associated body aching/cramping and fatigue. Denies any alleviating or aggravating factors.  History reviewed. No pertinent past medical history.  Patient Active Problem List   Diagnosis Date Noted  . PID (acute pelvic inflammatory disease) 02/23/2016    History reviewed. No pertinent surgical history.  OB History    No data available       Home Medications    Prior to Admission medications   Medication Sig Start Date End Date Taking? Authorizing Provider  amoxicillin (AMOXIL) 500 MG capsule Take 2 capsules (1,000 mg total) by mouth 2 (two) times daily. Patient not taking: Reported on 06/13/2016 06/02/16   Hayden Rasmussen, NP  HYDROcodone-acetaminophen (NORCO/VICODIN) 5-325 MG tablet Take 1 tablet by mouth every 4 (four) hours as needed. Patient not taking: Reported on 06/13/2016 06/02/16   Hayden Rasmussen, NP  ibuprofen (ADVIL,MOTRIN) 600 MG tablet Take 1 tablet (600 mg total) by mouth every 8 (eight) hours as needed for moderate pain. 06/13/16   Shaune Pollack, MD  naproxen (NAPROSYN) 500 MG tablet Take 1 tablet (500 mg total) by mouth 2 (two) times daily with a meal. Patient not taking: Reported on 06/13/2016 03/11/16   Deatra Canter, FNP  ondansetron (ZOFRAN) 4 MG  tablet Take 1 tablet (4 mg total) by mouth every 6 (six) hours. Patient not taking: Reported on 06/13/2016 12/21/15   Gilda Crease, MD  traMADol (ULTRAM) 50 MG tablet Take 1 tablet (50 mg total) by mouth every 6 (six) hours as needed. Patient not taking: Reported on 06/13/2016 12/21/15   Gilda Crease, MD    Family History History reviewed. No pertinent family history.  Social History Social History  Substance Use Topics  . Smoking status: Current Some Day Smoker    Types: Cigarettes  . Smokeless tobacco: Never Used  . Alcohol use No     Allergies   Patient has no known allergies.   Review of Systems Review of Systems  Constitutional: Positive for fatigue. Negative for chills and fever.  HENT: Negative for congestion, rhinorrhea and sore throat.   Eyes: Negative for visual disturbance.  Respiratory: Negative for cough, shortness of breath and wheezing.   Cardiovascular: Negative for chest pain and leg swelling.  Gastrointestinal: Negative for abdominal pain, diarrhea, nausea and vomiting.  Genitourinary: Negative for dysuria, flank pain, vaginal bleeding and vaginal discharge.  Musculoskeletal: Positive for arthralgias and myalgias. Negative for neck pain.  Skin: Negative for rash.  Allergic/Immunologic: Negative for immunocompromised state.  Neurological: Positive for light-headedness. Negative for syncope and headaches.  Hematological: Does not bruise/bleed easily.  All other systems reviewed and are negative.    Physical Exam Updated Vital Signs BP 113/77 (BP Location: Right Arm)   Pulse 62  Temp 98.5 F (36.9 C) (Oral)   Resp 16   LMP 05/14/2016   SpO2 100%   Physical Exam  Constitutional: She is oriented to person, place, and time. She appears well-developed and well-nourished. No distress.  HENT:  Head: Normocephalic and atraumatic.  Mouth/Throat: Oropharynx is clear and moist.  Eyes: Conjunctivae are normal.  Neck: Neck supple.    Cardiovascular: Normal rate, regular rhythm and normal heart sounds.  Exam reveals no friction rub.   No murmur heard. Pulmonary/Chest: Effort normal and breath sounds normal. No respiratory distress. She has no wheezes. She has no rales.  Abdominal: She exhibits no distension.  Musculoskeletal: She exhibits no edema.  Neurological: She is alert and oriented to person, place, and time. She exhibits normal muscle tone.  Skin: Skin is warm. Capillary refill takes less than 2 seconds.  Psychiatric: She has a normal mood and affect.  Nursing note and vitals reviewed.    ED Treatments / Results  Labs (all labs ordered are listed, but only abnormal results are displayed) Labs Reviewed  CBC WITH DIFFERENTIAL/PLATELET - Abnormal; Notable for the following:       Result Value   RBC 5.20 (*)    Hemoglobin 11.7 (*)    HCT 35.7 (*)    MCV 68.7 (*)    MCH 22.5 (*)    All other components within normal limits  COMPREHENSIVE METABOLIC PANEL - Abnormal; Notable for the following:    Calcium 8.7 (*)    Total Protein 6.0 (*)    ALT 9 (*)    All other components within normal limits  URINALYSIS, ROUTINE W REFLEX MICROSCOPIC - Abnormal; Notable for the following:    Ketones, ur 5 (*)    All other components within normal limits  LIPASE, BLOOD  I-STAT BETA HCG BLOOD, ED ([facility], [facility], [facility] ONLY)    EKG  EKG Interpretation None       Radiology No results found.  Procedures Procedures (including critical care time)  Medications Ordered in ED Medications  HYDROcodone-acetaminophen (NORCO/VICODIN) 5-325 MG per tablet 2 tablet (not administered)  sodium chloride 0.9 % bolus 1,000 mL (1,000 mLs Intravenous New Bag/Given 06/13/16 0801)  metoCLOPramide (REGLAN) injection 10 mg (10 mg Intravenous Given 06/13/16 0801)  diphenhydrAMINE (BENADRYL) injection 25 mg (25 mg Intravenous Given 06/13/16 0802)  ketorolac (TORADOL) 15 MG/ML injection 15 mg (15 mg Intravenous Given 06/13/16 0803)     Initial  Impression / Assessment and Plan / ED Course  I have reviewed the triage vital signs and the nursing notes.  Pertinent labs & imaging results that were available during my care of the patient were reviewed by me and considered in my medical decision making (see chart for details).     21 yo F with mild right-sided pain after donating plasma yesterday. Suspect mild myalgias in setting of dehydration versus position changes while sitting for donating plasma. No unilateral weakness, numbness or sx to suggest CNS or spinal etiology. Her lab work is unremarkable. Her abdomen is soft, NT, ND witihout focal TTP to suggest cholecystitis or appendicitis. Complex migraine on ddx as well and sx are markedly improved after IVF and analgesia here. Will advise supportive care, d/c home. No apparent emergent pathology at this time.  Final Clinical Impressions(s) / ED Diagnoses   Final diagnoses:  Myalgia  Dehydration    New Prescriptions New Prescriptions   IBUPROFEN (ADVIL,MOTRIN) 600 MG TABLET    Take 1 tablet (600 mg total) by mouth every 8 (eight)  hours as needed for moderate pain.     Shaune Pollack, MD 06/13/16 [phone]

## 2016-07-16 ENCOUNTER — Encounter (HOSPITAL_COMMUNITY): Payer: Self-pay | Admitting: Emergency Medicine

## 2016-07-16 ENCOUNTER — Ambulatory Visit (HOSPITAL_COMMUNITY)
Admission: EM | Admit: 2016-07-16 | Discharge: 2016-07-16 | Disposition: A | Discharge status: Home / Self Care | Payer: Medicaid Other | Attending: Family Medicine | Admitting: Family Medicine

## 2016-07-16 DIAGNOSIS — R51 Headache: Secondary | ICD-10-CM | POA: Diagnosis not present

## 2016-07-16 DIAGNOSIS — T148XXA Other injury of unspecified body region, initial encounter: Secondary | ICD-10-CM

## 2016-07-16 DIAGNOSIS — S39012A Strain of muscle, fascia and tendon of lower back, initial encounter: Secondary | ICD-10-CM | POA: Diagnosis not present

## 2016-07-16 MED ORDER — IBUPROFEN 800 MG PO TABS
800.0000 mg | ORAL_TABLET | Freq: Once | ORAL | Status: AC
  Administered 2016-07-16: 800 mg via ORAL

## 2016-07-16 MED ORDER — NAPROXEN 375 MG PO TABS: 375.0000 mg | ORAL_TABLET | Freq: Two times a day (BID) | ORAL | 0 refills | Status: DC

## 2016-07-16 MED ORDER — IBUPROFEN 800 MG PO TABS
ORAL_TABLET | ORAL | Status: AC
  Filled 2016-07-16: qty 1

## 2016-07-16 MED ORDER — TRAMADOL HCL 50 MG PO TABS: 50.0000 mg | ORAL_TABLET | Freq: Four times a day (QID) | ORAL | 0 refills | Status: DC | PRN

## 2016-07-16 NOTE — Discharge Instructions (Addendum)
Take the medication as directed. For the first day or 2 apply cold compresses. After that use heat. Start performing slow gentle stretches as demonstrated. No heavy lifting, bending or pulling. Read instructions on back pain and treatment.
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
## 2016-10-03 ENCOUNTER — Telehealth (HOSPITAL_COMMUNITY): Payer: Self-pay | Admitting: Emergency Medicine

## 2016-10-03 ENCOUNTER — Ambulatory Visit (HOSPITAL_COMMUNITY)
Admission: EM | Admit: 2016-10-03 | Discharge: 2016-10-03 | Disposition: A | Discharge status: Home / Self Care | Payer: Medicaid Other | Attending: Emergency Medicine | Admitting: Emergency Medicine

## 2016-10-03 ENCOUNTER — Encounter (HOSPITAL_COMMUNITY): Payer: Self-pay | Admitting: *Deleted

## 2016-10-03 ENCOUNTER — Emergency Department (HOSPITAL_COMMUNITY)
Admission: EM | Admit: 2016-10-03 | Discharge: 2016-10-03 | Disposition: A | Discharge status: Home / Self Care | Payer: Medicaid Other | Attending: Emergency Medicine | Admitting: Emergency Medicine

## 2016-10-03 ENCOUNTER — Encounter (HOSPITAL_COMMUNITY): Payer: Self-pay | Admitting: Emergency Medicine

## 2016-10-03 DIAGNOSIS — K0889 Other specified disorders of teeth and supporting structures: Secondary | ICD-10-CM | POA: Insufficient documentation

## 2016-10-03 DIAGNOSIS — K047 Periapical abscess without sinus: Secondary | ICD-10-CM

## 2016-10-03 DIAGNOSIS — Z5321 Procedure and treatment not carried out due to patient leaving prior to being seen by health care provider: Secondary | ICD-10-CM | POA: Diagnosis not present

## 2016-10-03 DIAGNOSIS — K029 Dental caries, unspecified: Secondary | ICD-10-CM | POA: Diagnosis not present

## 2016-10-03 MED ORDER — HYDROCODONE-ACETAMINOPHEN 5-325 MG PO TABS: 1.0000 | ORAL_TABLET | ORAL | 0 refills | Status: DC | PRN

## 2016-10-03 MED ORDER — AMOXICILLIN 500 MG PO CAPS: 1000.0000 mg | ORAL_CAPSULE | Freq: Two times a day (BID) | ORAL | 0 refills | Status: DC

## 2016-10-03 NOTE — ED Notes (Signed)
Patient not wanting to wait any longer, she left ED.

## 2016-10-03 NOTE — ED Triage Notes (Signed)
Patient reports dental pain to right upper jaw and left lower dental carry. Patient states she thinks she has an abscess to right upper side and she states she has a hole in a tooth on the bottom left.

## 2016-10-03 NOTE — ED Triage Notes (Signed)
Pt presents to ED for assessment of right upper dental pain where patient states "I have an abscess".  Pt states she does not have a dentist.  Pt states pain started three days ago, worsening each day.  Denies airway involvement.

## 2016-10-03 NOTE — Telephone Encounter (Signed)
The patient requested that her medications be resent to [facility] on [address]. 

## 2016-10-03 NOTE — ED Provider Notes (Signed)
[facility]    CSN: [id number] Arrival date & time: 10/03/16  1853     History   Chief Complaint Chief Complaint  Patient presents with  . Dental Pain    HPI Alexis [name] is a 21 y.o. female.   21 year old female With known history of bad dental repair and dental pain returns to the urgent care for treatment of pain and possible infection. She has taken OTC medication without relief. She has not seen a dentist and does not have a PCP.      History reviewed. No pertinent past medical history.  Patient Active Problem List   Diagnosis Date Noted  . PID (acute pelvic inflammatory disease) 02/23/2016    History reviewed. No pertinent surgical history.  OB History    No data available       Home Medications    Prior to Admission medications   Medication Sig Start Date End Date Taking? Authorizing Provider  amoxicillin (AMOXIL) 500 MG capsule Take 2 capsules (1,000 mg total) by mouth 2 (two) times daily. 10/03/16   Hayden RasmussenMabe, Toneisha Savary, NP  HYDROcodone-acetaminophen (NORCO/VICODIN) 5-325 MG tablet Take 1 tablet by mouth every 4 (four) hours as needed. 10/03/16   Hayden RasmussenMabe, Willette Mudry, NP  naproxen (NAPROSYN) 375 MG tablet Take 1 tablet (375 mg total) by mouth 2 (two) times daily. 07/16/16   Hayden RasmussenMabe, Ramsha Lonigro, NP  traMADol (ULTRAM) 50 MG tablet Take 1 tablet (50 mg total) by mouth every 6 (six) hours as needed. 07/16/16   Hayden RasmussenMabe, Dayron Odland, NP    Family History History reviewed. No pertinent family history.  Social History Social History  Substance Use Topics  . Smoking status: Current Some Day Smoker    Types: Cigarettes  . Smokeless tobacco: Never Used  . Alcohol use No     Allergies   Patient has no known allergies.   Review of Systems Review of Systems  Constitutional: Negative.  Negative for fever.  HENT: Positive for dental problem.   Respiratory: Negative.   Gastrointestinal: Negative.   Neurological: Negative.   All other systems reviewed and are  negative.    Physical Exam Triage Vital Signs ED Triage Vitals  Enc Vitals Group     BP 10/03/16 1931 121/76     Pulse Rate 10/03/16 1931 (!) 59     Resp 10/03/16 1931 16     Temp 10/03/16 1931 98.7 F (37.1 C)     Temp Source 10/03/16 1931 Oral     SpO2 10/03/16 1931 99 %     Weight --      Height --      Head Circumference --      Peak Flow --      Pain Score 10/03/16 1935 10     Pain Loc --      Pain Edu? --      Excl. in GC? --    No data found.   Updated Vital Signs BP 121/76 (BP Location: Left Arm)   Pulse (!) 59   Temp 98.7 F (37.1 C) (Oral)   Resp 16   SpO2 99%   Visual Acuity Right Eye Distance:   Left Eye Distance:   Bilateral Distance:    Right Eye Near:   Left Eye Near:    Bilateral Near:     Physical Exam  Constitutional: She is oriented to person, place, and time. She appears well-developed and well-nourished. No distress.  HENT:  Right upper third molar with dental tenderness and surrounding gingival erythema  and swelling. Several other teeth are eroded, with cavities  Eyes: EOM are normal.  Neck: Normal range of motion. Neck supple.  Cardiovascular: Normal rate.   Pulmonary/Chest: Effort normal. No respiratory distress.  Musculoskeletal: She exhibits no edema.  Neurological: She is alert and oriented to person, place, and time. She exhibits normal muscle tone.  Skin: Skin is warm and dry.  Psychiatric: She has a normal mood and affect.  Nursing note and vitals reviewed.    UC Treatments / Results  Labs (all labs ordered are listed, but only abnormal results are displayed) Labs Reviewed - No data to display  EKG  EKG Interpretation None       Radiology No results found.  Procedures Procedures (including critical care time)  Medications Ordered in UC Medications - No data to display   Initial Impression / Assessment and Plan / UC Course  I have reviewed the triage vital signs and the nursing notes.  Pertinent labs &  imaging results that were available during my care of the patient were reviewed by me and considered in my medical decision making (see chart for details).       Final Clinical Impressions(s) / UC Diagnoses   Final diagnoses:  Pain, dental  Dental caries  Infected dental caries    New Prescriptions New Prescriptions   AMOXICILLIN (AMOXIL) 500 MG CAPSULE    Take 2 capsules (1,000 mg total) by mouth 2 (two) times daily.   HYDROCODONE-ACETAMINOPHEN (NORCO/VICODIN) 5-325 MG TABLET    Take 1 tablet by mouth every 4 (four) hours as needed.     Controlled Substance Prescriptions [place] Controlled Substance Registry consulted? Yes, I have consulted the [place] Controlled Substances Registry for this patient, and feel the risk/benefit ratio today is favorable for proceeding with this prescription for a controlled substance.   Hayden Rasmussen, NP 10/03/16 [phone]

## 2016-11-12 ENCOUNTER — Ambulatory Visit (HOSPITAL_COMMUNITY)
Admission: EM | Admit: 2016-11-12 | Discharge: 2016-11-12 | Disposition: A | Discharge status: Home / Self Care | Payer: Medicaid Other | Attending: Urgent Care | Admitting: Urgent Care

## 2016-11-12 ENCOUNTER — Encounter (HOSPITAL_COMMUNITY): Payer: Self-pay | Admitting: Family Medicine

## 2016-11-12 DIAGNOSIS — K029 Dental caries, unspecified: Secondary | ICD-10-CM

## 2016-11-12 DIAGNOSIS — K0889 Other specified disorders of teeth and supporting structures: Secondary | ICD-10-CM

## 2016-11-12 DIAGNOSIS — M79601 Pain in right arm: Secondary | ICD-10-CM | POA: Diagnosis not present

## 2016-11-12 MED ORDER — KETOROLAC TROMETHAMINE 60 MG/2ML IM SOLN
60.0000 mg | Freq: Once | INTRAMUSCULAR | Status: AC
  Administered 2016-11-12: 60 mg via INTRAMUSCULAR

## 2016-11-12 MED ORDER — KETOROLAC TROMETHAMINE 60 MG/2ML IM SOLN
INTRAMUSCULAR | Status: AC
  Filled 2016-11-12: qty 2

## 2016-11-12 MED ORDER — TRAMADOL-ACETAMINOPHEN 37.5-325 MG PO TABS: 1.0000 | ORAL_TABLET | Freq: Three times a day (TID) | ORAL | 0 refills | Status: DC | PRN

## 2016-11-12 MED ORDER — CELECOXIB 100 MG PO CAPS: 100.0000 mg | ORAL_CAPSULE | Freq: Two times a day (BID) | ORAL | 1 refills | Status: DC

## 2016-11-12 NOTE — Discharge Instructions (Signed)
If you develop fever, redness, swelling, nausea and vomiting, please return to our clinic for a recheck.
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
## 2016-11-12 NOTE — ED Provider Notes (Signed)
MRN: [id number] DOB: 30-Nov-1995  Subjective:   Alexis [name] is a 21 y.o. female presenting for chief complaint of Arm Pain  Reports 1 day history of right forearm pain after she donated plasma. Her pain is constant, sharp, pulsating, has associated pain just inferior to the site of IV placement over volar surface of forearm. She also has jaw pain, has had persistent dental issues requiring surgery which is not covered by her medicaid. Has also had nausea without vomiting. Has tried ibuprofen PM, naproxen, APAP. Denies fever, belly pain, red streaks, swelling, loss of sensation, chest pain.  Mae is not currently taking any medications and also has No Known Allergies.  [place] denies past medical and surgical history.   Objective:   Vitals: BP 116/77   Pulse 77   Temp 98.6 F (37 C)   Resp 18   SpO2 100%   Physical Exam  Constitutional: She is oriented to person, place, and time. She appears well-developed and well-nourished.  HENT:  Mouth/Throat:    Neck: Normal range of motion. Neck supple.  Cardiovascular: Normal rate, regular rhythm and intact distal pulses.  Exam reveals no gallop and no friction rub.   No murmur heard. Pulmonary/Chest: No respiratory distress. She has no wheezes. She has no rales.  Musculoskeletal:       Right elbow: She exhibits normal range of motion, no swelling, no effusion, no deformity and no laceration. Tenderness (over wound from IV site) found.       Left elbow: She exhibits normal range of motion, no swelling, no effusion, no deformity and no laceration. No tenderness found.       Arms: Lymphadenopathy:    She has no cervical adenopathy.  Neurological: She is alert and oriented to person, place, and time.  Skin: Skin is warm and dry.   Assessment and Plan :   Right arm pain  Tooth pain  Tooth decay   Will use conservative management for her arm pain. She is to take Celebrex twice daily and use Tramadol for breakthrough pain. I  emphasized to patient that she needs to seek a dentist that will work with her financially for her dental surgery. At this time, I do not suspect infection, need for antibiotics. Return-to-clinic precautions discussed, patient verbalized understanding.   Wallis Bamberg, PA-C [facility]  11/12/2016  8:11 PM   Wallis Bamberg, PA-C 11/12/16 2036

## 2017-03-26 ENCOUNTER — Encounter (HOSPITAL_COMMUNITY): Payer: Self-pay | Admitting: Emergency Medicine

## 2017-03-26 ENCOUNTER — Ambulatory Visit (HOSPITAL_COMMUNITY)
Admission: EM | Admit: 2017-03-26 | Discharge: 2017-03-26 | Disposition: A | Discharge status: Home / Self Care | Payer: Medicaid Other | Attending: Family Medicine | Admitting: Family Medicine

## 2017-03-26 DIAGNOSIS — K0889 Other specified disorders of teeth and supporting structures: Secondary | ICD-10-CM

## 2017-03-26 MED ORDER — HYDROCODONE-ACETAMINOPHEN 5-325 MG PO TABS
ORAL_TABLET | ORAL | Status: AC
  Filled 2017-03-26: qty 1

## 2017-03-26 MED ORDER — HYDROCODONE-ACETAMINOPHEN 5-325 MG PO TABS: 1.0000 | ORAL_TABLET | Freq: Four times a day (QID) | ORAL | 0 refills | Status: AC | PRN

## 2017-03-26 MED ORDER — HYDROCODONE-ACETAMINOPHEN 5-325 MG PO TABS
1.0000 | ORAL_TABLET | Freq: Once | ORAL | Status: AC
  Administered 2017-03-26: 1 via ORAL

## 2017-03-26 MED ORDER — CLINDAMYCIN HCL 300 MG PO CAPS: 300.0000 mg | ORAL_CAPSULE | Freq: Three times a day (TID) | ORAL | 0 refills | Status: AC

## 2017-03-26 NOTE — ED Provider Notes (Signed)
[facility]    CSN: [id number] Arrival date & time: 03/26/17  1221     History   Chief Complaint Chief Complaint  Patient presents with  . Dental Pain    HPI Alexis [name] is a 22 y.o. female with dental pain for 2 days.  She states that her pain is nerve pain.  Pain is the top right last molar and bottom left back molar.  Pain with eating, talking.  Has had difficulty seeking permanent fix of this issue due to Medicaid not covering.  Denies fever and facial swelling.  HPI  History reviewed. No pertinent past medical history.  Patient Active Problem List   Diagnosis Date Noted  . PID (acute pelvic inflammatory disease) 02/23/2016    History reviewed. No pertinent surgical history.  OB History    No data available       Home Medications    Prior to Admission medications   Medication Sig Start Date End Date Taking? Authorizing Provider  naproxen (NAPROSYN) 375 MG tablet Take 1 tablet (375 mg total) by mouth 2 (two) times daily. 07/16/16  Yes Mabe, Onalee Hua, NP  celecoxib (CELEBREX) 100 MG capsule Take 1 capsule (100 mg total) by mouth 2 (two) times daily. 11/12/16   Wallis Bamberg, PA-C  clindamycin (CLEOCIN) 300 MG capsule Take 1 capsule (300 mg total) by mouth 3 (three) times daily for 5 days. 03/26/17 03/31/17  Gearldean Lomanto C, PA-C  HYDROcodone-acetaminophen (NORCO/VICODIN) 5-325 MG tablet Take 1 tablet by mouth every 6 (six) hours as needed for up to 2 days for severe pain. 03/26/17 03/28/17  Eean Buss C, PA-C  traMADol-acetaminophen (ULTRACET) 37.5-325 MG tablet Take 1 tablet by mouth every 8 (eight) hours as needed for severe pain. 11/12/16   Wallis Bamberg, PA-C    Family History History reviewed. No pertinent family history.  Social History Social History   Tobacco Use  . Smoking status: Current Some Day Smoker    Packs/day: 0.25    Types: Cigarettes  . Smokeless tobacco: Never Used  Substance Use Topics  . Alcohol use: No  . Drug use: Yes    Types:  Marijuana     Allergies   Patient has no known allergies.   Review of Systems Review of Systems  Constitutional: Negative for fever.  HENT: Positive for dental problem. Negative for ear pain and sore throat.   Respiratory: Negative for shortness of breath.   Cardiovascular: Negative for chest pain.  Gastrointestinal: Negative for nausea and vomiting.  Musculoskeletal: Negative for neck pain.  Neurological: Positive for headaches.     Physical Exam Triage Vital Signs ED Triage Vitals  Enc Vitals Group     BP 03/26/17 1325 121/83     Pulse Rate 03/26/17 1325 71     Resp 03/26/17 1325 20     Temp 03/26/17 1325 98 F (36.7 C)     Temp Source 03/26/17 1325 Oral     SpO2 03/26/17 1325 99 %     Weight --      Height --      Head Circumference --      Peak Flow --      Pain Score 03/26/17 1326 10     Pain Loc --      Pain Edu? --      Excl. in GC? --    No data found.  Updated Vital Signs BP 121/83 (BP Location: Left Arm)   Pulse 71   Temp 98 F (36.7 C) (  Oral)   Resp 20   LMP 02/26/2017   SpO2 99%    Physical Exam  Constitutional: She appears well-developed and well-nourished.  Teary-eyed in room, swinging back and forth due to pain  HENT:  Head: Normocephalic and atraumatic.  Mouth/Throat: Uvula is midline, oropharynx is clear and moist and mucous membranes are normal. Abnormal dentition.    Patient with poor dentition, multiple broken teeth and decay.  Mild gingival swelling surrounding teeth causing problem.  No facial asymmetry.  No facial swelling observed.  Eyes: Conjunctivae are normal.  Neck: Neck supple.  Cardiovascular: Normal rate.  No murmur heard. Pulmonary/Chest: Effort normal. No respiratory distress.  Musculoskeletal: She exhibits no edema.  Neurological: She is alert.  Skin: Skin is warm and dry.  Psychiatric: She has a normal mood and affect.  Nursing note and vitals reviewed.    UC Treatments / Results  Labs (all labs ordered  are listed, but only abnormal results are displayed) Labs Reviewed - No data to display  EKG  EKG Interpretation None       Radiology No results found.  Procedures Procedures (including critical care time)  Medications Ordered in UC Medications  HYDROcodone-acetaminophen (NORCO/VICODIN) 5-325 MG per tablet 1 tablet (not administered)     Initial Impression / Assessment and Plan / UC Course  I have reviewed the triage vital signs and the nursing notes.  Pertinent labs & imaging results that were available during my care of the patient were reviewed by me and considered in my medical decision making (see chart for details).     Patient in distress due to pain.  Will provide one Norco here today, patient has a driver and friend with her states that he will make sure she does not drive.  We will provide clindamycin for infection, couple days of Norco for pain.  Stated for her to use this only for severe pain.  Use Tylenol ibuprofen for mild to moderate pain.  He is provided a [facility] resource for her to follow-up and seek permanent treatment. Discussed strict return precautions. Patient verbalized understanding and is agreeable with plan.   Final Clinical Impressions(s) / UC Diagnoses   Final diagnoses:  Pain, dental    ED Discharge Orders        Ordered    clindamycin (CLEOCIN) 300 MG capsule  3 times daily     03/26/17 1340    HYDROcodone-acetaminophen (NORCO/VICODIN) 5-325 MG tablet  Every 6 hours PRN     03/26/17 1340       Controlled Substance Prescriptions [place] Controlled Substance Registry consulted? Yes, I have consulted the [place] Controlled Substances Registry for this patient, and feel the risk/benefit ratio today is favorable for proceeding with this prescription for a controlled substance.  Last prescription in September 2018.   Lew DawesWieters, Nyomi Howser C, [place]PA-C 03/26/17 1348

## 2017-03-26 NOTE — ED Triage Notes (Signed)
PT C/O: dental pain on bilateral side onset 2 days... Hurts to eat  DENIES:   TAKING MEDS:   A&O x4... NAD... Ambulatory

## 2017-03-26 NOTE — Discharge Instructions (Signed)
Please use dental resource to contact offices to seek permenant treatment/relief.  ° °Today we have given you an antibiotic. This should help with pain as any infection is cleared.  ° °For pain please take 600mg-800mg of Ibuprofen every 8 hours, take with 1000 mg of Tylenol Extra strength every 8 hours. These are safe to take together. Please take with food.  ° °I have also provided 2 days worth of stronger pain medication. This should only be used for severe pain. Do not drive or operate machinery while taking this medication.  ° °Please return if you start to experience significant swelling of your face, experiencing fever. °

## 2017-03-26 NOTE — ED Notes (Signed)
Pt here w/boyfriend who is driving.

## 2017-12-13 IMAGING — DX DG FINGER LITTLE 2+V*R*
3 series · 3 of 3 positions shown · non-contrast
Comparison: None.

CLINICAL DATA: Slipped on ice and injured finger yesterday. Pain
and swelling.

EXAM:
RIGHT LITTLE FINGER 2+V

[finger ap]
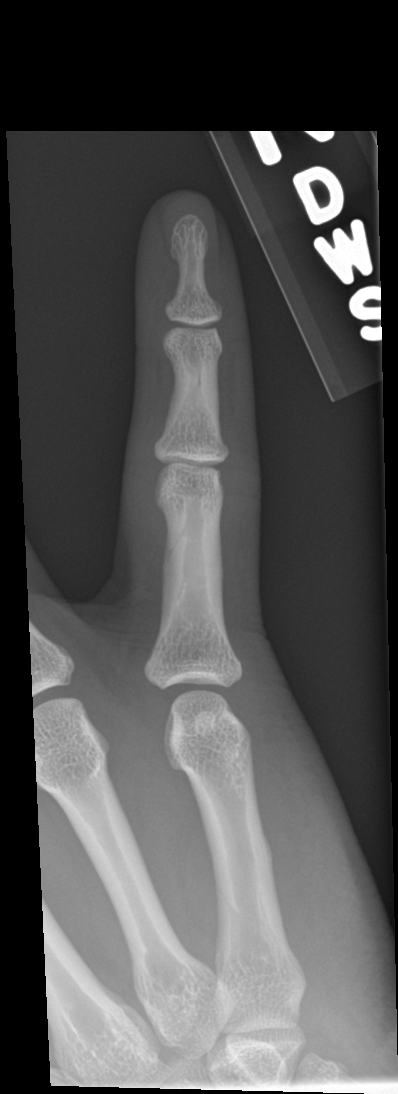

[finger obl]
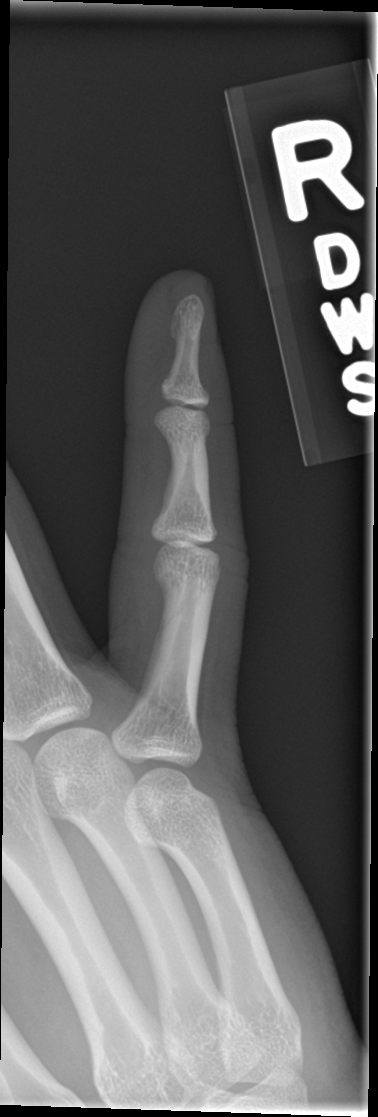

[finger lat]
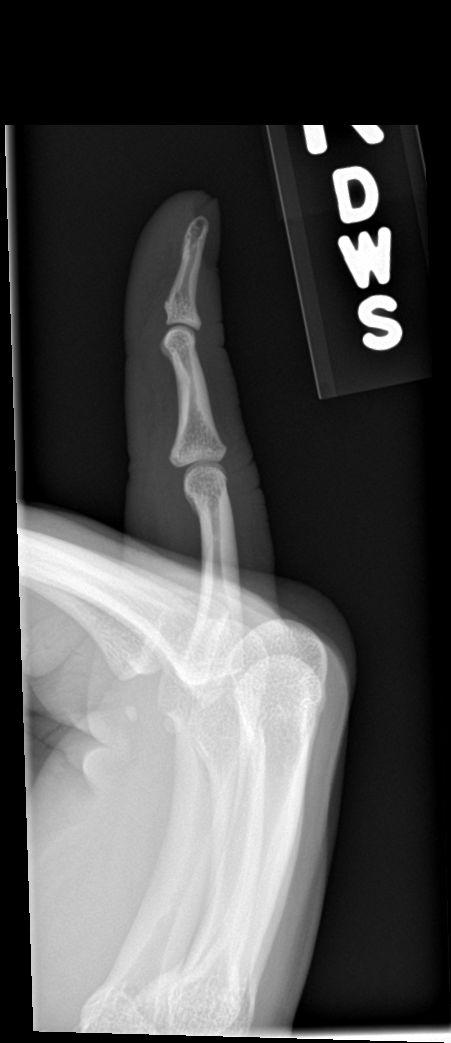

[3 of 3 positions shown; findings below may reference images not displayed]

FINDINGS: There is no evidence of fracture or dislocation. There is no
evidence of arthropathy or other focal bone abnormality. Soft
tissues are unremarkable.
IMPRESSION: Negative.

## 2018-02-21 NOTE — L&D Delivery Note (Addendum)
OB[facility]N Faculty Practice Delivery Note  Alexis [name] is a 23 y.o. G2P1001 s/p SVD at [redacted]w[redacted]d. She was admitted for SROM.   ROM: SROM on 12/17 at 11AM at home with clear fluid GBS Status:  Negative/-- (12/05 0000) Maximum Maternal Temperature: 98.6   Labor Progress: Patient arrived at 2.5 cm dilation and was induced with cytotec, FB, pitocin and rupture of forebag.   Delivery Date/Time: 02/08/2019 at 0456 Delivery: Called to room and patient was complete and pushing. Head delivered in ROA position w/ compound presentation of L hand. Tight nuchal cord and R ankle cord present, reduced after delivery. Shoulder and body delivered via somersault maneuver. Infant with spontaneous cry, placed on mother's abdomen, dried and stimulated. Cord clamped x 2 after 1-minute delay, and cut by FOB. Cord blood drawn. Placenta delivered spontaneously with gentle cord traction. Fundus firm with massage and Pitocin. Labia, perineum, vagina, and cervix inspected with R periurethral laceration noted, hemostatic after repair w/ 3-0 monocryl. IUD placed by Nigel Berthold, see separate procedure note.   Placenta: spontaneous, intact, 3 vessel cord Complications: none Lacerations: R periurethral laceration, hemostatic after repair w/ 3-0 monocryl.  EBL: 260cc Analgesia: epidural   Infant: APGAR (1 MIN): 8   APGAR (5 MINS): 9    Weight: pending 1 hr skin-to-skin  Demetrius Revel, MD PGY-3 The above was performed under my direct supervision and guidance. Christin Fudge

## 2018-06-18 ENCOUNTER — Ambulatory Visit (HOSPITAL_COMMUNITY)
Admission: EM | Admit: 2018-06-18 | Discharge: 2018-06-18 | Disposition: A | Discharge status: Home / Self Care | Payer: Medicaid Other | Attending: Family Medicine | Admitting: Family Medicine

## 2018-06-18 ENCOUNTER — Encounter (HOSPITAL_COMMUNITY): Payer: Self-pay

## 2018-06-18 ENCOUNTER — Other Ambulatory Visit: Payer: Self-pay

## 2018-06-18 DIAGNOSIS — Z720 Tobacco use: Secondary | ICD-10-CM

## 2018-06-18 DIAGNOSIS — Z3201 Encounter for pregnancy test, result positive: Secondary | ICD-10-CM | POA: Diagnosis not present

## 2018-06-18 LAB — POCT PREGNANCY, URINE: Preg Test, Ur: POSITIVE — AB

## 2018-06-18 MED ORDER — PRENATAL COMPLETE 14-0.4 MG PO TABS: 1.0000 | ORAL_TABLET | Freq: Every day | ORAL | 0 refills | Status: DC

## 2018-06-18 NOTE — ED Notes (Signed)
Patient verbalizes understanding of discharge instructions. Opportunity for questioning and answers were provided. Patient discharged from [facility].  

## 2018-06-18 NOTE — Discharge Instructions (Signed)
Positive pregnancy test today, difficult to determine how far due to unknown period, but approximately 8 weeks.  Please stop seroquel until have been seen by Ob to determine medication safe for pregnancy.  Please call OB for first appointment.  Please try to quit smoking.  See medications safe for pregnancy list provided.  [facility] if develop pain or vaginal bleeding.

## 2018-06-18 NOTE — ED Triage Notes (Signed)
Patient presents to Urgent Care with complaints of wanting a pregnancy test. Patient states her last menstrual was February 2020 and she has been having some nausea and breast tenderness.

## 2018-06-18 NOTE — ED Provider Notes (Signed)
[facility]    CSN: [id number] Arrival date & time: 06/18/18  1913     History   Chief Complaint Chief Complaint  Patient presents with  . Possible Pregnancy    HPI Alexis [name] is a 23 y.o. female.   Alexis [name] presents for pregnancy testing. She is uncertain of specific date but LMP was approximately end of February first of March 2020. States she feels similar to when she was pregnant with her child, which was 8 years ago. Denies abdominal pain. No vaginal bleeding. She has breast tenderness. She has had unprotected sex. She is not on birth control. She does smoke. seroquel for depression. Does not follow regularly with an OB/gyn.   ROS per HPI, negative if not otherwise mentioned.      History reviewed. No pertinent past medical history.  Patient Active Problem List   Diagnosis Date Noted  . PID (acute pelvic inflammatory disease) 02/23/2016    History reviewed. No pertinent surgical history.  OB History   No obstetric history on file.      Home Medications    Prior to Admission medications   Medication Sig Start Date End Date Taking? Authorizing Provider  ferrous sulfate 325 (65 FE) MG tablet Take 325 mg by mouth daily with breakfast.   Yes [provider]  QUEtiapine (SEROQUEL) 100 MG tablet Take 100 mg by mouth at bedtime.   Yes [provider]  celecoxib (CELEBREX) 100 MG capsule Take 1 capsule (100 mg total) by mouth 2 (two) times daily. 11/12/16   Wallis Bamberg, PA-C  naproxen (NAPROSYN) 375 MG tablet Take 1 tablet (375 mg total) by mouth 2 (two) times daily. 07/16/16   Hayden Rasmussen, NP  Prenatal Vit-Fe Fumarate-FA (PRENATAL COMPLETE) 14-0.4 MG TABS Take 1 tablet by mouth daily. 06/18/18   Georgetta Haber, NP  traMADol-acetaminophen (ULTRACET) 37.5-325 MG tablet Take 1 tablet by mouth every 8 (eight) hours as needed for severe pain. 11/12/16   Wallis Bamberg, PA-C    Family History History reviewed. No pertinent family  history.  Social History Social History   Tobacco Use  . Smoking status: Current Some Day Smoker    Packs/day: 0.25    Types: Cigarettes  . Smokeless tobacco: Never Used  Substance Use Topics  . Alcohol use: No  . Drug use: Yes     Allergies   Patient has no known allergies.   Review of Systems Review of Systems   Physical Exam Triage Vital Signs ED Triage Vitals  Enc Vitals Group     BP 06/18/18 1932 125/83     Pulse Rate 06/18/18 1932 98     Resp 06/18/18 1932 17     Temp 06/18/18 1932 98.3 F (36.8 C)     Temp Source 06/18/18 1932 Oral     SpO2 06/18/18 1932 100 %     Weight 06/18/18 1929 195 lb (88.5 kg)     Height 06/18/18 1929 5\' 5"  (1.651 m)     Head Circumference --      Peak Flow --      Pain Score 06/18/18 1929 6     Pain Loc --      Pain Edu? --      Excl. in GC? --    No data found.  Updated Vital Signs BP 125/83 (BP Location: Left Arm)   Pulse 98   Temp 98.3 F (36.8 C) (Oral)   Resp 17   Ht 5\' 5"  (1.651 m)  Wt 195 lb (88.5 kg)   LMP 03/28/2018   SpO2 100%   BMI 32.45 kg/m    Physical Exam Constitutional:      General: She is not in acute distress.    Appearance: She is well-developed.  Cardiovascular:     Rate and Rhythm: Normal rate and regular rhythm.     Heart sounds: Normal heart sounds.  Pulmonary:     Effort: Pulmonary effort is normal.     Breath sounds: Normal breath sounds.  Abdominal:     Tenderness: There is no abdominal tenderness.  Skin:    General: Skin is warm and dry.  Neurological:     Mental Status: She is alert and oriented to person, place, and time.      UC Treatments / Results  Labs (all labs ordered are listed, but only abnormal results are displayed) Labs Reviewed  POCT PREGNANCY, URINE - Abnormal; Notable for the following components:      Result Value   Preg Test, Ur POSITIVE (*)    All other components within normal limits  POC URINE PREG, ED    EKG None  Radiology No results  found.  Procedures Procedures (including critical care time)  Medications Ordered in UC Medications - No data to display  Initial Impression / Assessment and Plan / UC Course  I have reviewed the triage vital signs and the nursing notes.  Pertinent labs & imaging results that were available during my care of the patient were reviewed by me and considered in my medical decision making (see chart for details).     Positive pregnancy test. Approximately [redacted] weeks pregnant. Encouraged to quit smoking, start prenatal. Stop seroquel at this time. Encouraged OB follow up. Return precautions provided. Patient verbalized understanding and agreeable to plan.   Final Clinical Impressions(s) / UC Diagnoses   Final diagnoses:  Positive pregnancy test     Discharge Instructions     Positive pregnancy test today, difficult to determine how far due to unknown period, but approximately 8 weeks.  Please stop seroquel until have been seen by Ob to determine medication safe for pregnancy.  Please call OB for first appointment.  Please try to quit smoking.  See medications safe for pregnancy list provided.  [facility] to [facility] if develop pain or vaginal bleeding.    ED Prescriptions    Medication Sig Dispense Auth. Provider   Prenatal Vit-Fe Fumarate-FA (PRENATAL COMPLETE) 14-0.4 MG TABS Take 1 tablet by mouth daily. 60 each Georgetta HaberBurky, Bawi Lakins B, NP     Controlled Substance Prescriptions [place] Controlled Substance Registry consulted? Not Applicable   Georgetta HaberBurky, Ski Polich B, NP 06/18/18 2015

## 2018-06-25 ENCOUNTER — Inpatient Hospital Stay (HOSPITAL_COMMUNITY)
Admission: AC | Admit: 2018-06-25 | Discharge: 2018-06-25 | Disposition: A | Discharge status: Home / Self Care | Payer: Medicaid Other | Attending: Obstetrics & Gynecology | Admitting: Obstetrics & Gynecology

## 2018-06-25 ENCOUNTER — Other Ambulatory Visit: Payer: Self-pay

## 2018-06-25 ENCOUNTER — Encounter (HOSPITAL_COMMUNITY): Payer: Self-pay | Admitting: *Deleted

## 2018-06-25 ENCOUNTER — Inpatient Hospital Stay (HOSPITAL_COMMUNITY): Payer: Medicaid Other

## 2018-06-25 DIAGNOSIS — R109 Unspecified abdominal pain: Secondary | ICD-10-CM | POA: Diagnosis not present

## 2018-06-25 DIAGNOSIS — O26891 Other specified pregnancy related conditions, first trimester: Secondary | ICD-10-CM

## 2018-06-25 DIAGNOSIS — O23591 Infection of other part of genital tract in pregnancy, first trimester: Secondary | ICD-10-CM | POA: Diagnosis not present

## 2018-06-25 DIAGNOSIS — Z3A01 Less than 8 weeks gestation of pregnancy: Secondary | ICD-10-CM | POA: Diagnosis not present

## 2018-06-25 DIAGNOSIS — B379 Candidiasis, unspecified: Secondary | ICD-10-CM | POA: Diagnosis not present

## 2018-06-25 DIAGNOSIS — Z3A13 13 weeks gestation of pregnancy: Secondary | ICD-10-CM

## 2018-06-25 HISTORY — DX: Chlamydial infection, unspecified: A74.9

## 2018-06-25 HISTORY — DX: Trichomoniasis, unspecified: A59.9

## 2018-06-25 LAB — COMPREHENSIVE METABOLIC PANEL
ALT: 17 U/L (ref 0–44)
AST: 20 U/L (ref 15–41)
Albumin: 3.8 g/dL (ref 3.5–5.0)
Alkaline Phosphatase: 44 U/L (ref 38–126)
Anion gap: 8 (ref 5–15)
BUN: 5 mg/dL — ABNORMAL LOW (ref 6–20)
CO2: 23 mmol/L (ref 22–32)
Calcium: 9 mg/dL (ref 8.9–10.3)
Chloride: 106 mmol/L (ref 98–111)
Creatinine, Ser: 0.64 mg/dL (ref 0.44–1.00)
GFR calc Af Amer: >60 mL/min (ref >60)
GFR calc non Af Amer: >60 mL/min (ref >60)
Glucose, Bld: 89 mg/dL (ref 70–99)
Potassium: 3.9 mmol/L (ref 3.5–5.1)
Sodium: 137 mmol/L (ref 135–145)
Total Bilirubin: 0.8 mg/dL (ref 0.3–1.2)
Total Protein: 6.6 g/dL (ref 6.5–8.1)

## 2018-06-25 LAB — HCG, QUANTITATIVE, PREGNANCY: hCG, Beta Chain, Quant, S: 18458 m[IU]/mL — ABNORMAL HIGH (ref <5)

## 2018-06-25 LAB — CBC WITH DIFFERENTIAL/PLATELET
Abs Immature Granulocytes: 0.04 10*3/uL (ref 0.00–0.07)
Basophils Absolute: 0 10*3/uL (ref 0.0–0.1)
Basophils Relative: 0 %
Eosinophils Absolute: 0.1 10*3/uL (ref 0.0–0.5)
Eosinophils Relative: 1 %
HCT: 35.9 % — ABNORMAL LOW (ref 36.0–46.0)
Hemoglobin: 11.4 g/dL — ABNORMAL LOW (ref 12.0–15.0)
Immature Granulocytes: 1 %
Lymphocytes Relative: 20 %
Lymphs Abs: 1.8 10*3/uL (ref 0.7–4.0)
MCH: 22.1 pg — ABNORMAL LOW (ref 26.0–34.0)
MCHC: 31.8 g/dL (ref 30.0–36.0)
MCV: 69.4 fL — ABNORMAL LOW (ref 80.0–100.0)
Monocytes Absolute: 0.5 10*3/uL (ref 0.1–1.0)
Monocytes Relative: 5 %
Neutro Abs: 6.4 10*3/uL (ref 1.7–7.7)
Neutrophils Relative %: 73 %
Platelets: 296 10*3/uL (ref 150–400)
RBC: 5.17 MIL/uL — ABNORMAL HIGH (ref 3.87–5.11)
RDW: 16.1 % — ABNORMAL HIGH (ref 11.5–15.5)
WBC: 8.8 10*3/uL (ref 4.0–10.5)
nRBC: 0 % (ref 0.0–0.2)

## 2018-06-25 LAB — URINALYSIS, ROUTINE W REFLEX MICROSCOPIC
Bilirubin Urine: NEGATIVE
Glucose, UA: NEGATIVE mg/dL
Hgb urine dipstick: NEGATIVE
Ketones, ur: NEGATIVE mg/dL
Nitrite: NEGATIVE
Protein, ur: NEGATIVE mg/dL
Specific Gravity, Urine: 1.018 (ref 1.005–1.030)
pH: 6 (ref 5.0–8.0)

## 2018-06-25 LAB — ABO/RH: ABO/RH(D): O POS

## 2018-06-25 LAB — WET PREP, GENITAL
Clue Cells Wet Prep HPF POC: NONE SEEN
Sperm: NONE SEEN
Trich, Wet Prep: NONE SEEN

## 2018-06-25 MED ORDER — TERCONAZOLE 0.4 % VA CREA: 1.0000 | TOPICAL_CREAM | Freq: Every day | VAGINAL | 0 refills | Status: DC

## 2018-06-25 NOTE — Discharge Instructions (Signed)
Abdominal Pain During Pregnancy ° °Abdominal pain is common during pregnancy, and has many possible causes. Some causes are more serious than others, and sometimes the cause is not known. Abdominal pain can be a sign that labor is starting. It can also be caused by normal growth and stretching of muscles and ligaments during pregnancy. Always tell your health care provider if you have any abdominal pain. °Follow these instructions at home: °· Do not have sex or put anything in your vagina until your pain goes away completely. °· Get plenty of rest until your pain improves. °· Drink enough fluid to keep your urine pale yellow. °· Take over-the-counter and prescription medicines only as told by your health care provider. °· Keep all follow-up visits as told by your health care provider. This is important. °Contact a health care provider if: °· Your pain continues or gets worse after resting. °· You have lower abdominal pain that: °? Comes and goes at regular intervals. °? Spreads to your back. °? Is similar to menstrual cramps. °· You have pain or burning when you urinate. °Get help right away if: °· You have a fever or chills. °· You have vaginal bleeding. °· You are leaking fluid from your vagina. °· You are passing tissue from your vagina. °· You have vomiting or diarrhea that lasts for more than 24 hours. °· Your baby is moving less than usual. °· You feel very weak or faint. °· You have shortness of breath. °· You develop severe pain in your upper abdomen. °Summary °· Abdominal pain is common during pregnancy, and has many possible causes. °· If you experience abdominal pain during pregnancy, tell your health care provider right away. °· Follow your health care provider's home care instructions and keep all follow-up visits as directed. °This information is not intended to replace advice given to you by your health care provider. Make sure you discuss any questions you have with your health care  provider. °Document Released: 02/07/2005 Document Revised: 05/12/2016 Document Reviewed: 05/12/2016 °Elsevier Interactive Patient Education © 2019 [employer] ° °

## 2018-06-25 NOTE — MAU Provider Note (Signed)
History     CSN: [id number]  Arrival date and time: 06/25/18 4098   First Provider Initiated Contact with Patient 06/25/18 [phone]      Chief Complaint  Patient presents with  . Abdominal Pain  . Back Pain   HPI   Ms.Alexis [name] is a 23 y.o. female G2P1001 @ [redacted]w[redacted]d (uncertain LMP) here with new onset lower abdomen and lower back pain that started yesterday. The pain is constant and is on both sides of her lower abdomen. She has tried tylenol for the pain which is not helping. No bleeding.   OB History    Gravida  2   Para  1   Term  1   Preterm      AB      Living  1     SAB      TAB      Ectopic      Multiple      Live Births              Past Medical History:  Diagnosis Date  . Chlamydia   . Trichomonas infection     Past Surgical History:  Procedure Laterality Date  . NO PAST SURGERIES      No family history on file.  Social History   Tobacco Use  . Smoking status: Former Smoker    Packs/day: 0.25    Types: Cigarettes  . Smokeless tobacco: Never Used  Substance Use Topics  . Alcohol use: No  . Drug use: Not Currently    Allergies: No Known Allergies  Medications Prior to Admission  Medication Sig Dispense Refill Last Dose  . Prenatal Vit-Fe Fumarate-FA (PRENATAL COMPLETE) 14-0.4 MG TABS Take 1 tablet by mouth daily. 60 each 0 06/24/2018 at Unknown time  . celecoxib (CELEBREX) 100 MG capsule Take 1 capsule (100 mg total) by mouth 2 (two) times daily. 30 capsule 1   . ferrous sulfate 325 (65 FE) MG tablet Take 325 mg by mouth daily with breakfast.     . naproxen (NAPROSYN) 375 MG tablet Take 1 tablet (375 mg total) by mouth 2 (two) times daily. 20 tablet 0 03/26/2017 at Unknown time  . QUEtiapine (SEROQUEL) 100 MG tablet Take 100 mg by mouth at bedtime.     . traMADol-acetaminophen (ULTRACET) 37.5-325 MG tablet Take 1 tablet by mouth every 8 (eight) hours as needed for severe pain. 10 tablet 0    Results for orders placed or performed  during the hospital encounter of 06/25/18 (from the past 72 hour(s))  GC/Chlamydia probe amp ([facility])not at [facility]     Status: None   Collection Time: 06/25/18 12:00 AM  Result Value Ref Range   Chlamydia Negative     Comment: Normal Reference Range - Negative   Neisseria gonorrhea Negative     Comment: Normal Reference Range - Negative  Urinalysis, Routine w reflex microscopic     Status: Abnormal   Collection Time: 06/25/18  8:57 AM  Result Value Ref Range   Color, Urine YELLOW YELLOW   APPearance CLEAR CLEAR   Specific Gravity, Urine 1.018 1.005 - 1.030   pH 6.0 5.0 - 8.0   Glucose, UA NEGATIVE NEGATIVE mg/dL   Hgb urine dipstick NEGATIVE NEGATIVE   Bilirubin Urine NEGATIVE NEGATIVE   Ketones, ur NEGATIVE NEGATIVE mg/dL   Protein, ur NEGATIVE NEGATIVE mg/dL   Nitrite NEGATIVE NEGATIVE   Leukocytes,Ua TRACE (A) NEGATIVE   RBC / HPF 0-5 0 - 5 RBC/hpf  WBC, UA 0-5 0 - 5 WBC/hpf   Bacteria, UA RARE (A) NONE SEEN   Squamous Epithelial / LPF 0-5 0 - 5   Mucus PRESENT     Comment: Performed at [facility], [address], [place], [place] [address]  CBC with Differential/Platelet     Status: Abnormal   Collection Time: 06/25/18  9:36 AM  Result Value Ref Range   WBC 8.8 4.0 - 10.5 K/uL   RBC 5.17 (H) 3.87 - 5.11 MIL/uL   Hemoglobin 11.4 (L) 12.0 - 15.0 g/dL   HCT 73.4 (L) 19.3 - 79.0 %   MCV 69.4 (L) 80.0 - 100.0 fL   MCH 22.1 (L) 26.0 - 34.0 pg   MCHC 31.8 30.0 - 36.0 g/dL   RDW 24.0 (H) 97.3 - 53.2 %   Platelets 296 150 - 400 K/uL    Comment: REPEATED TO VERIFY   nRBC 0.0 0.0 - 0.2 %   Neutrophils Relative % 73 %   Neutro Abs 6.4 1.7 - 7.7 K/uL   Lymphocytes Relative 20 %   Lymphs Abs 1.8 0.7 - 4.0 K/uL   Monocytes Relative 5 %   Monocytes Absolute 0.5 0.1 - 1.0 K/uL   Eosinophils Relative 1 %   Eosinophils Absolute 0.1 0.0 - 0.5 K/uL   Basophils Relative 0 %   Basophils Absolute 0.0 0.0 - 0.1 K/uL   Immature Granulocytes 1 %   Abs Immature Granulocytes  0.04 0.00 - 0.07 K/uL    Comment: Performed at [facility], [address], [place], [place] [address]  Comprehensive metabolic panel     Status: Abnormal   Collection Time: 06/25/18  9:36 AM  Result Value Ref Range   Sodium 137 135 - 145 mmol/L   Potassium 3.9 3.5 - 5.1 mmol/L   Chloride 106 98 - 111 mmol/L   CO2 23 22 - 32 mmol/L   Glucose, Bld 89 70 - 99 mg/dL   BUN 5 (L) 6 - 20 mg/dL   Creatinine, Ser 6.83 0.44 - 1.00 mg/dL   Calcium 9.0 8.9 - 41.9 mg/dL   Total Protein 6.6 6.5 - 8.1 g/dL   Albumin 3.8 3.5 - 5.0 g/dL   AST 20 15 - 41 U/L   ALT 17 0 - 44 U/L   Alkaline Phosphatase 44 38 - 126 U/L   Total Bilirubin 0.8 0.3 - 1.2 mg/dL   GFR calc non Af Amer >60 >60 mL/min   GFR calc Af Amer >60 >60 mL/min   Anion gap 8 5 - 15    Comment: Performed at [facility], [address], [place], [place] [address]  ABO/Rh     Status: None   Collection Time: 06/25/18  9:36 AM  Result Value Ref Range   ABO/RH(D) O POS    No rh immune globuloin      NOT A RH IMMUNE GLOBULIN CANDIDATE, PT RH POSITIVE Performed at [facility], [address], [place], [place] [address]   hCG, quantitative, pregnancy     Status: Abnormal   Collection Time: 06/25/18  9:36 AM  Result Value Ref Range   hCG, Beta Chain, Quant, S 18,458 (H) <5 mIU/mL    Comment:          GEST. AGE      CONC.  (mIU/mL)   <=1 WEEK        5 - 50     2 WEEKS  50 - 500     3 WEEKS       100 - 10,000     4 WEEKS     1,000 - 30,000     5 WEEKS     3,500 - 115,000   6-8 WEEKS     12,000 - 270,000    12 WEEKS     15,000 - 220,000        FEMALE AND NON-PREGNANT FEMALE:     LESS THAN 5 mIU/mL Performed at [facility], [address] [place], [place] [address]   HIV Antibody (routine testing w rflx)     Status: None   Collection Time: 06/25/18  9:36 AM  Result Value Ref Range   HIV Screen 4th Generation wRfx Non Reactive Non Reactive    Comment: (NOTE) Performed At: [facility] [address] [place], [place] [id number] Jolene SchimkeNagendra Sanjai MD OZ:[id number]:[phone]   Wet prep, genital     Status: Abnormal   Collection Time: 06/25/18 11:11 AM  Result Value Ref Range   Yeast Wet Prep HPF POC PRESENT (A) NONE SEEN   Trich, Wet Prep NONE SEEN NONE SEEN   Clue Cells Wet Prep HPF POC NONE SEEN NONE SEEN   WBC, Wet Prep HPF POC MANY (A) NONE SEEN    Comment: MANY BACTERIA SEEN   Sperm NONE SEEN     Comment: Performed at [facility], [address] [place], [place] [address]     [place] Ob Less Than 14 Weeks With Ob Transvaginal  Result Date: 06/25/2018 CLINICAL DATA:  Abdominal pain in 1st trimester pregnancy. EXAM: OBSTETRIC <14 WK US AND TRANSVAGINAL OB US TECHNIQUE: Both transabdominal and transvaginal ultrasound examinations were performed for complete evaluation of the gestation as well as the maternal uterus, adnexal regions, and pelvic cul-de-sac. Transvaginal technique was performed to assess early pregnancy. COMPARISON:  None. FINDINGS: Intrauterine gestational sac: Single Yolk sac:  Visualized. Embryo:  Visualized. Cardiac Activity: Visualized. Heart Rate: 106 bpm CRL:  2 mm   5 w   5 d                  US EDC: 02/20/2019 Subchorionic hemorrhage:  None visualized. Maternal uterus/adnexae: Normal appearance of both ovaries. No mass or abnormal free fluid identified. IMPRESSION: Single living IUP measuring 5 weeks 5 days, with US EDC of 02/20/2019. No significant maternal uterine or adnexal abnormality identified. Electronically Signed   By: Myles RosenthalJohn  Stahl M.D.   On: 06/25/2018 11:13   Review of Systems  Constitutional: Negative for chills and fever.  Gastrointestinal: Positive for abdominal pain. Negative for constipation, nausea and vomiting.  Genitourinary: Negative for dysuria and vaginal bleeding.   Physical Exam   Blood pressure 119/78, pulse 93, temperature 98.4 F (36.9 C), temperature source Oral, resp. rate 16, weight 93.4 kg, last menstrual period 03/26/2018.  Physical  Exam  Constitutional: She is oriented to person, place, and time. She appears well-developed and well-nourished. No distress.  HENT:  Head: Normocephalic.  Eyes: Pupils are equal, round, and reactive to light.  Respiratory: Effort normal.  GI: Soft. She exhibits no distension. There is no abdominal tenderness. There is no rebound and no guarding.  Genitourinary:    Genitourinary Comments: Wet prep and GC collected without speculum  Bimanual exam: cervix closed, anterior. No CMT   Musculoskeletal: Normal range of motion.  Neurological: She is alert and oriented to person, place, and time.  Skin: Skin is warm. She is  not diaphoretic.  Psychiatric: Her behavior is normal.    MAU Course  Procedures  None  MDM Wet prep & GC HIV, CBC, Hcg, ABO US OB transvaginal  O positive blood type   Assessment and Plan   A:  1. Yeast infection   2. Abdominal pain in pregnancy, first trimester      P:  Discharge home in stable condition  Rx: Terazol  Increase oral fluid intake Start prenatal care.  Return to MAU if symptoms worsen   Margrit Minner, Harolyn Rutherford, NP 06/27/2018 1:59 PM

## 2018-06-25 NOTE — MAU Note (Signed)
Pt C/O lower back & abdominal pain since yesterday, denies bleeding.  Is unable to sleep or eat due to the pain.  Denies vomiting or diarrhea.  No fever.

## 2018-06-26 LAB — GC/CHLAMYDIA PROBE AMP (~~LOC~~) NOT AT ARMC
Chlamydia: NEGATIVE
Neisseria Gonorrhea: NEGATIVE

## 2018-06-26 LAB — HIV ANTIBODY (ROUTINE TESTING W REFLEX): HIV Screen 4th Generation wRfx: NONREACTIVE

## 2018-07-03 LAB — OB RESULTS CONSOLE HIV ANTIBODY (ROUTINE TESTING): HIV: NONREACTIVE

## 2018-07-03 LAB — OB RESULTS CONSOLE GC/CHLAMYDIA: Gonorrhea: NEGATIVE

## 2018-07-03 LAB — OB RESULTS CONSOLE RPR: RPR: NONREACTIVE

## 2018-07-03 LAB — OB RESULTS CONSOLE HEPATITIS B SURFACE ANTIGEN: Hepatitis B Surface Ag: NEGATIVE

## 2018-07-03 LAB — OB RESULTS CONSOLE RUBELLA ANTIBODY, IGM: Rubella: NON-IMMUNE/NOT IMMUNE

## 2018-08-20 ENCOUNTER — Other Ambulatory Visit: Payer: Self-pay

## 2018-08-20 ENCOUNTER — Ambulatory Visit (HOSPITAL_COMMUNITY)
Admission: EM | Admit: 2018-08-20 | Discharge: 2018-08-20 | Disposition: A | Discharge status: Home / Self Care | Payer: Medicaid Other

## 2018-08-20 ENCOUNTER — Inpatient Hospital Stay (HOSPITAL_COMMUNITY)
Admission: AD | Admit: 2018-08-20 | Discharge: 2018-08-20 | Disposition: A | Discharge status: Home / Self Care | Payer: Medicaid Other | Attending: Obstetrics & Gynecology | Admitting: Obstetrics & Gynecology

## 2018-08-20 ENCOUNTER — Encounter (HOSPITAL_COMMUNITY): Payer: Self-pay | Admitting: *Deleted

## 2018-08-20 DIAGNOSIS — Z3A13 13 weeks gestation of pregnancy: Secondary | ICD-10-CM | POA: Diagnosis not present

## 2018-08-20 DIAGNOSIS — R109 Unspecified abdominal pain: Secondary | ICD-10-CM | POA: Insufficient documentation

## 2018-08-20 DIAGNOSIS — Z87891 Personal history of nicotine dependence: Secondary | ICD-10-CM | POA: Diagnosis not present

## 2018-08-20 DIAGNOSIS — O26891 Other specified pregnancy related conditions, first trimester: Secondary | ICD-10-CM | POA: Insufficient documentation

## 2018-08-20 DIAGNOSIS — M25511 Pain in right shoulder: Secondary | ICD-10-CM | POA: Insufficient documentation

## 2018-08-20 DIAGNOSIS — M79604 Pain in right leg: Secondary | ICD-10-CM | POA: Diagnosis not present

## 2018-08-20 DIAGNOSIS — O9A212 Injury, poisoning and certain other consequences of external causes complicating pregnancy, second trimester: Secondary | ICD-10-CM | POA: Diagnosis not present

## 2018-08-20 DIAGNOSIS — W06XXXA Fall from bed, initial encounter: Secondary | ICD-10-CM | POA: Diagnosis not present

## 2018-08-20 DIAGNOSIS — W19XXXA Unspecified fall, initial encounter: Secondary | ICD-10-CM

## 2018-08-20 DIAGNOSIS — M7918 Myalgia, other site: Secondary | ICD-10-CM

## 2018-08-20 LAB — URINALYSIS, ROUTINE W REFLEX MICROSCOPIC
Bilirubin Urine: NEGATIVE
Glucose, UA: NEGATIVE mg/dL
Hgb urine dipstick: NEGATIVE
Ketones, ur: NEGATIVE mg/dL
Leukocytes,Ua: NEGATIVE
Nitrite: NEGATIVE
Protein, ur: NEGATIVE mg/dL
Specific Gravity, Urine: 1.023 (ref 1.005–1.030)
pH: 6 (ref 5.0–8.0)

## 2018-08-20 MED ORDER — CYCLOBENZAPRINE HCL 10 MG PO TABS
10.0000 mg | ORAL_TABLET | Freq: Three times a day (TID) | ORAL | Status: DC | PRN
  Administered 2018-08-20: 10 mg via ORAL
  Filled 2018-08-20: qty 1

## 2018-08-20 MED ORDER — ACETAMINOPHEN 500 MG PO TABS
1000.0000 mg | ORAL_TABLET | Freq: Four times a day (QID) | ORAL | Status: DC | PRN
  Administered 2018-08-20: 1000 mg via ORAL
  Filled 2018-08-20: qty 2

## 2018-08-20 MED ORDER — CYCLOBENZAPRINE HCL 10 MG PO TABS: 10.0000 mg | ORAL_TABLET | Freq: Three times a day (TID) | ORAL | 0 refills | Status: DC | PRN

## 2018-08-20 NOTE — MAU Provider Note (Signed)
History     CSN: [id number]  Arrival date and time: 08/20/18 1725   First Provider Initiated Contact with Patient 08/20/18 1829      Chief Complaint  Patient presents with  . Abdominal Pain  . Back Pain   G2P1001 @13 .6 wks presenting with pain after falling out of the bed. Incident happened around 4-5am. She is unsure how she fell but landed on her right side. Pain is in right shoulder, right flank, and right leg. She is ambulating w/o problem. She took Tylenol but it didn't help. Denies abd pain or VB.    OB History    Gravida  2   Para  1   Term  1   Preterm      AB      Living  1     SAB      TAB      Ectopic      Multiple      Live Births              Past Medical History:  Diagnosis Date  . Chlamydia   . Trichomonas infection     Past Surgical History:  Procedure Laterality Date  . NO PAST SURGERIES      History reviewed. No pertinent family history.  Social History   Tobacco Use  . Smoking status: Former Smoker    Packs/day: 0.25    Types: Cigarettes  . Smokeless tobacco: Never Used  Substance Use Topics  . Alcohol use: No  . Drug use: Not Currently    Allergies: No Known Allergies  Medications Prior to Admission  Medication Sig Dispense Refill Last Dose  . Prenatal Vit-Fe Fumarate-FA (PRENATAL COMPLETE) 14-0.4 MG TABS Take 1 tablet by mouth daily. 60 each 0 08/20/2018 at Unknown time  . celecoxib (CELEBREX) 100 MG capsule Take 1 capsule (100 mg total) by mouth 2 (two) times daily. 30 capsule 1   . ferrous sulfate 325 (65 FE) MG tablet Take 325 mg by mouth daily with breakfast.     . QUEtiapine (SEROQUEL) 100 MG tablet Take 100 mg by mouth at bedtime.     Marland Kitchen. terconazole (TERAZOL 7) 0.4 % vaginal cream Place 1 applicator vaginally at bedtime. 45 g 0     Review of Systems  Gastrointestinal: Negative for abdominal pain.  Genitourinary: Positive for flank pain. Negative for vaginal bleeding and vaginal discharge.  Neurological:  Negative for syncope.   Physical Exam   Blood pressure 127/85, pulse 78, temperature 98.2 F (36.8 C), resp. rate 16, last menstrual period 03/26/2018, SpO2 98 %.  Physical Exam  Nursing note and vitals reviewed. Constitutional: She is oriented to person, place, and time. She appears well-developed and well-nourished. No distress.  HENT:  Head: Normocephalic and atraumatic.  Neck: Normal range of motion.  Cardiovascular: Normal rate.  Respiratory: No respiratory distress.  GI: Soft. She exhibits no distension and no mass. There is no abdominal tenderness. There is no rebound and no guarding.  Musculoskeletal: Normal range of motion.        General: No tenderness, deformity or edema.     Right shoulder: Normal.     Right hip: Normal.  Neurological: She is alert and oriented to person, place, and time.  Skin: Skin is warm and dry. No erythema.  Psychiatric: She has a normal mood and affect.  FHT 145  Results for orders placed or performed during the hospital encounter of 08/20/18 (from the past 24 hour(s))  Urinalysis, Routine  w reflex microscopic     Status: Abnormal   Collection Time: 08/20/18  6:19 PM  Result Value Ref Range   Color, Urine YELLOW YELLOW   APPearance HAZY (A) CLEAR   Specific Gravity, Urine 1.023 1.005 - 1.030   pH 6.0 5.0 - 8.0   Glucose, UA NEGATIVE NEGATIVE mg/dL   Hgb urine dipstick NEGATIVE NEGATIVE   Bilirubin Urine NEGATIVE NEGATIVE   Ketones, ur NEGATIVE NEGATIVE mg/dL   Protein, ur NEGATIVE NEGATIVE mg/dL   Nitrite NEGATIVE NEGATIVE   Leukocytes,Ua NEGATIVE NEGATIVE   MAU Course  Procedures Meds ordered this encounter  Medications  . cyclobenzaprine (FLEXERIL) tablet 10 mg  . acetaminophen (TYLENOL) tablet 1,000 mg  . cyclobenzaprine (FLEXERIL) 10 MG tablet    Sig: Take 1 tablet (10 mg total) by mouth 3 (three) times daily as needed for muscle spasms.    Dispense:  30 tablet    Refill:  0    Order Specific Question:   Supervising Provider     Answer:   Sloan Leiter [[id number]]   MDM No evidence of injury. Pain likely MSK, should be self-limiting. FHTs normal, pt reassured. Discussed comfort measures. Pain improved. Stable for discharge home.   Assessment and Plan   1. [redacted] weeks gestation of pregnancy   2. Musculoskeletal pain   3. Fall, initial encounter    Discharge home Follow up at [facility] as scheduled Rx Flexeril Return precautions  Allergies as of 08/20/2018   No Known Allergies     Medication List    STOP taking these medications   celecoxib 100 MG capsule Commonly known as: CeleBREX   ferrous sulfate 325 (65 FE) MG tablet   QUEtiapine 100 MG tablet Commonly known as: SEROQUEL   terconazole 0.4 % vaginal cream Commonly known as: Terazol 7     TAKE these medications   cyclobenzaprine 10 MG tablet Commonly known as: FLEXERIL Take 1 tablet (10 mg total) by mouth 3 (three) times daily as needed for muscle spasms.   Prenatal Complete 14-0.4 MG Tabs Take 1 tablet by mouth daily.       Julianne Handler, CNM 08/20/2018, 7:48 PM

## 2018-08-20 NOTE — MAU Note (Signed)
Alexis [name] is a 23 y.o. at [redacted]w[redacted]d here in MAU reporting:that she fell off the bed this morning and landed on her right side and states the pain is not getting any better. Pt denies any vaginal bleeding or abnormal discharge  Onset of complaint: 0930 Pain score: 9 Vitals:   08/20/18 1753 08/20/18 1754  BP: 127/85   Pulse: 78   Resp: 16   Temp: 98.2 F (36.8 C)   SpO2:  98%     FHT 145 Lab orders placed from triage: UA

## 2018-08-20 NOTE — Discharge Instructions (Signed)
Preventing Injuries During Pregnancy ° °Injuries can happen during pregnancy. Minor falls and accidents usually do not harm you or your baby. But some injuries can harm you and your baby. Tell your doctor about any injury you suffer. °What can I do to avoid injuries? °Safety °· Remove rugs and loose objects on the floor. °· Wear comfortable shoes that have a good grip. Do not wear shoes that have high heels. °· Always wear your seat belt in the car. The lap belt should be below your belly. Always drive safely. °· Do not ride on a motorcycle. °Activity °· Do not take part in rough and violent activities or sports. °· Avoid: °? Walking on wet or slippery floors. °? Lifting heavy pots of boiling or hot liquids. °? Fixing electrical problems. °? Being near fires. °General instructions °· Take over-the-counter and prescription medicines only as told by your doctor. °· Know your blood type and the blood type of the baby's father. °· If you are a victim of domestic violence: °? Call your local emergency services (911 in the [place] °? Contact the National Domestic Violence Hotline for help and support. °Get help right away if: °· You fall on your belly or receive any serious blow to your belly. °· You have a stiff neck or neck pain after a fall or an injury. °· You get a headache or have problems with vision after an injury. °· You do not feel the baby move or the baby is not moving as much as normal. °· You have been a victim of domestic violence or any other kind of attack. °· You have been in a car accident. °· You have bleeding from your vagina. °· Fluid is leaking from your vagina. °· You start to have cramping or pain in your belly (contractions). °· You have very bad pain in your lower back. °· You feel weak or pass out (faint). °· You start to throw up (vomit) after an injury. °· You have been burned. °Summary °· Some injuries that happen during pregnancy can do harm to the baby. °· Tell your doctor about any  injury. °· Take steps to avoid injury. This includes removing rugs and loose objects on the floor. Always wear your seat belt in the car. °· Do not take part in rough and violent activities or sports. °· Get help right away if you have any serious accident or injury. °This information is not intended to replace advice given to you by your health care provider. Make sure you discuss any questions you have with your health care provider. °Document Released: 03/12/2010 Document Revised: 02/17/2016 Document Reviewed: 02/17/2016 °Elsevier Patient Education © 2020 [employer] Inc. ° °

## 2018-08-20 NOTE — ED Notes (Signed)
Patient is being discharged from the Urgent [place] and sent to the Emergency Department via Car by Family Member Per Dr Meda Coffee, patient is stable but in need of higher level of care due to Fall & is [redacted] weeks pregnant. Patient is aware and verbalizes understanding of plan of care. There were no vitals filed for this visit.

## 2018-09-29 ENCOUNTER — Other Ambulatory Visit: Payer: Self-pay

## 2018-09-29 ENCOUNTER — Encounter (HOSPITAL_COMMUNITY): Payer: Self-pay

## 2018-09-29 ENCOUNTER — Inpatient Hospital Stay (HOSPITAL_COMMUNITY)
Admission: AD | Admit: 2018-09-29 | Discharge: 2018-09-30 | Disposition: A | Discharge status: Home / Self Care | Payer: Medicaid Other | Attending: Family Medicine | Admitting: Family Medicine

## 2018-09-29 DIAGNOSIS — Z3A19 19 weeks gestation of pregnancy: Secondary | ICD-10-CM | POA: Insufficient documentation

## 2018-09-29 DIAGNOSIS — O219 Vomiting of pregnancy, unspecified: Secondary | ICD-10-CM

## 2018-09-29 DIAGNOSIS — Z87891 Personal history of nicotine dependence: Secondary | ICD-10-CM | POA: Insufficient documentation

## 2018-09-29 DIAGNOSIS — O210 Mild hyperemesis gravidarum: Secondary | ICD-10-CM | POA: Diagnosis not present

## 2018-09-29 LAB — URINALYSIS, ROUTINE W REFLEX MICROSCOPIC
Bacteria, UA: NONE SEEN
Bilirubin Urine: NEGATIVE
Glucose, UA: NEGATIVE mg/dL
Hgb urine dipstick: NEGATIVE
Ketones, ur: 80 mg/dL — AB
Leukocytes,Ua: NEGATIVE
Nitrite: NEGATIVE
Protein, ur: 100 mg/dL — AB
Specific Gravity, Urine: 1.019 (ref 1.005–1.030)
pH: 6 (ref 5.0–8.0)

## 2018-09-29 MED ORDER — ONDANSETRON 4 MG PO TBDP: 4.0000 mg | ORAL_TABLET | Freq: Four times a day (QID) | ORAL | 0 refills | Status: DC | PRN

## 2018-09-29 MED ORDER — LACTATED RINGERS IV SOLN
INTRAVENOUS | Status: DC
  Administered 2018-09-29: 23:00:00 via INTRAVENOUS

## 2018-09-29 MED ORDER — SODIUM CHLORIDE 0.9 % IV SOLN
8.0000 mg | Freq: Once | INTRAVENOUS | Status: AC
  Administered 2018-09-29: 8 mg via INTRAVENOUS
  Filled 2018-09-29: qty 4

## 2018-09-29 MED ORDER — LACTATED RINGERS IV BOLUS
1000.0000 mL | Freq: Once | INTRAVENOUS | Status: AC
  Administered 2018-09-29: 1000 mL via INTRAVENOUS

## 2018-09-29 NOTE — Discharge Instructions (Signed)
Morning Sickness ° °Morning sickness is when a woman feels nauseous during pregnancy. This nauseous feeling may or may not come with vomiting. It often occurs in the morning, but it can be a problem at any time of day. Morning sickness is most common during the first trimester. In some cases, it may continue throughout pregnancy. Although morning sickness is unpleasant, it is usually harmless unless the woman develops severe and continual vomiting (hyperemesis gravidarum), a condition that requires more intense treatment. °What are the causes? °The exact cause of this condition is not known, but it seems to be related to normal hormonal changes that occur in pregnancy. °What increases the risk? °You are more likely to develop this condition if: °· You experienced nausea or vomiting before your pregnancy. °· You had morning sickness during a previous pregnancy. °· You are pregnant with more than one baby, such as twins. °What are the signs or symptoms? °Symptoms of this condition include: °· Nausea. °· Vomiting. °How is this diagnosed? °This condition is usually diagnosed based on your signs and symptoms. °How is this treated? °In many cases, treatment is not needed for this condition. Making some changes to what you eat may help to control symptoms. Your health care provider may also prescribe or recommend: °· Vitamin B6 supplements. °· Anti-nausea medicines. °· Ginger. °Follow these instructions at home: °Medicines °· Take over-the-counter and prescription medicines only as told by your health care provider. Do not use any prescription, over-the-counter, or herbal medicines for morning sickness without first talking with your health care provider. °· Taking multivitamins before getting pregnant can prevent or decrease the severity of morning sickness in most women. °Eating and drinking °· Eat a piece of dry toast or crackers before getting out of bed in the morning. °· Eat 5 or 6 small meals a day. °· Eat dry and  bland foods, such as rice or a baked potato. Foods that are high in carbohydrates are often helpful. °· Avoid greasy, fatty, and spicy foods. °· Have someone cook for you if the smell of any food causes nausea and vomiting. °· If you feel nauseous after taking prenatal vitamins, take the vitamins at night or with a snack. °· Snack on protein foods between meals if you are hungry. Nuts, yogurt, and cheese are good options. °· Drink fluids throughout the day. °· Try ginger ale made with real ginger, ginger tea made from fresh grated ginger, or ginger candies. °General instructions °· Do not use any products that contain nicotine or tobacco, such as cigarettes and e-cigarettes. If you need help quitting, ask your health care provider. °· Get an air purifier to keep the air in your house free of odors. °· Get plenty of fresh air. °· Try to avoid odors that trigger your nausea. °· Consider trying these methods to help relieve symptoms: °? Wearing an acupressure wristband. These wristbands are often worn for seasickness. °? Acupuncture. °Contact a health care provider if: °· Your home remedies are not working and you need medicine. °· You feel dizzy or light-headed. °· You are losing weight. °Get help right away if: °· You have persistent and uncontrolled nausea and vomiting. °· You faint. °· You have severe pain in your abdomen. °Summary °· Morning sickness is when a woman feels nauseous during pregnancy. This nauseous feeling may or may not come with vomiting. °· Morning sickness is most common during the first trimester. °· It often occurs in the morning, but it can be a problem at   any time of day. °· In many cases, treatment is not needed for this condition. Making some changes to what you eat may help to control symptoms. °This information is not intended to replace advice given to you by your health care provider. Make sure you discuss any questions you have with your health care provider. °Document Released:  03/31/2006 Document Revised: 01/20/2017 Document Reviewed: 03/12/2016 °Elsevier Patient Education © 2020 Elsevier Inc. ° °

## 2018-09-29 NOTE — MAU Provider Note (Signed)
History     CSN: [id number]  Arrival date and time: 09/29/18 1816   First Provider Initiated Contact with Patient 09/29/18 2038      Chief Complaint  Patient presents with  . Emesis  . Nausea   Alexis [name] is a 23 y.o. G2P1001 at 5911w4d who receives [facility].  She presents today for Emesis and Nausea.  She states she has not been able "to keep anything down" and reports throwing up about 10-15x today.  She reports that she takes medication every night, but does not know the name.  She reports taking a dose last night, but states "it didn't work."  She states she has attempted to eat "cereal, fruit, and noodles" and drinking water without success. She reports that she threw up once upon arrival, but continues to feel nauseous.  Patient endorses fetal movement and denies vaginal concerns including discharge, bleeding, and leaking.      OB History    Gravida  2   Para  1   Term  1   Preterm      AB      Living  1     SAB      TAB      Ectopic      Multiple      Live Births              Past Medical History:  Diagnosis Date  . Chlamydia   . Trichomonas infection     Past Surgical History:  Procedure Laterality Date  . NO PAST SURGERIES      History reviewed. No pertinent family history.  Social History   Tobacco Use  . Smoking status: Former Smoker    Packs/day: 0.25    Types: Cigarettes  . Smokeless tobacco: Never Used  Substance Use Topics  . Alcohol use: No  . Drug use: Not Currently    Allergies: No Known Allergies  Medications Prior to Admission  Medication Sig Dispense Refill Last Dose  . cyclobenzaprine (FLEXERIL) 10 MG tablet Take 1 tablet (10 mg total) by mouth 3 (three) times daily as needed for muscle spasms. 30 tablet 0   . Prenatal Vit-Fe Fumarate-FA (PRENATAL COMPLETE) 14-0.4 MG TABS Take 1 tablet by mouth daily. 60 each 0     Review of Systems  Constitutional: Negative for chills and fever.   Respiratory: Negative for cough and shortness of breath.   Gastrointestinal: Positive for nausea and vomiting. Negative for abdominal pain.  Genitourinary: Negative for difficulty urinating, dysuria, vaginal bleeding and vaginal discharge.  Musculoskeletal: Negative for back pain.  Neurological: Negative for dizziness, light-headedness and headaches.   Physical Exam   Blood pressure 116/60, pulse 73, temperature 98.7 F (37.1 C), resp. rate 18, height 5\' 5"  (1.651 m), weight 89.1 kg, last menstrual period 03/26/2018, SpO2 99 %.  Physical Exam  Vitals reviewed. Constitutional: She is oriented to person, place, and time. She appears well-developed and well-nourished. No distress.  HENT:  Head: Normocephalic and atraumatic.  Eyes: Conjunctivae are normal.  Neck: Normal range of motion.  Cardiovascular: Normal rate.  Respiratory: Effort normal.  Musculoskeletal: Normal range of motion.  Neurological: She is alert and oriented to person, place, and time.  Psychiatric: She has a normal mood and affect. Her behavior is normal.    MAU Course  Procedures Results for orders placed or performed during the hospital encounter of 09/29/18 (from the past 24 hour(s))  Urinalysis, Routine w reflex microscopic  Status: Abnormal   Collection Time: 09/29/18  7:45 PM  Result Value Ref Range   Color, Urine YELLOW YELLOW   APPearance HAZY (A) CLEAR   Specific Gravity, Urine 1.019 1.005 - 1.030   pH 6.0 5.0 - 8.0   Glucose, UA NEGATIVE NEGATIVE mg/dL   Hgb urine dipstick NEGATIVE NEGATIVE   Bilirubin Urine NEGATIVE NEGATIVE   Ketones, ur 80 (A) NEGATIVE mg/dL   Protein, ur 100 (A) NEGATIVE mg/dL   Nitrite NEGATIVE NEGATIVE   Leukocytes,Ua NEGATIVE NEGATIVE   RBC / HPF 0-5 0 - 5 RBC/hpf   WBC, UA 0-5 0 - 5 WBC/hpf   Bacteria, UA NONE SEEN NONE SEEN   Squamous Epithelial / LPF 11-20 0 - 5   Mucus PRESENT    Hyaline Casts, UA PRESENT     MDM Start IV LR Bolus Antiemetic  Assessment  and Plan  23 year old G2P1001 SIUP at 19.4 weeks Nausea/Vomiting  -Informed of POC as above. -Will give Zofran 8mg  and reassess nausea. -Plan for PO challenge and if successful, will send home with prn script for Zofran. -Instructed to restart N/V medication as previously prescribed. -No questions or concerns. -Will reassess as appropriate.   Maryann Conners MSN, CNM 09/29/2018, 8:38 PM   Reassessment (11:19 PM)  -Reports improvement in nausea -Has tolerated soda and crackers. -Rx for zofran 4mg  ODT sent to pharmacy on file.  -Instructed to inform primary ob of new medication. -Bleeding Precautions given. -Encouraged to call or return to MAU if symptoms worsen or with the onset of new symptoms. -Discharged to home in improved condition.  Maryann Conners MSN, CNM

## 2018-09-29 NOTE — MAU Note (Signed)
Alexis [name] is a 23 y.o. at [redacted]w[redacted]d here in MAU reporting: states she has been able to keep anything down since about 0630. Emesis x 15 today. Is on diclegis (she thinks), missed her dose last night. No diarrhea. No abdominal pain, vaginal bleeding, or discharge.  Onset of complaint: today  Pain score: 0/10  Vitals:   09/29/18 1838  BP: 122/74  Pulse: 92  Resp: 18  Temp: 98.7 F (37.1 C)  SpO2: 99%     FHT:144  Lab orders placed from triage: UA

## 2018-10-23 DIAGNOSIS — B029 Zoster without complications: Secondary | ICD-10-CM

## 2018-10-23 HISTORY — DX: Zoster without complications: B02.9

## 2018-10-25 LAB — OB RESULTS CONSOLE RPR: RPR: NONREACTIVE

## 2018-11-14 ENCOUNTER — Inpatient Hospital Stay (HOSPITAL_COMMUNITY)
Admission: AD | Admit: 2018-11-14 | Discharge: 2018-11-15 | Disposition: A | Discharge status: Home / Self Care | Payer: Medicaid Other | Attending: Obstetrics & Gynecology | Admitting: Obstetrics & Gynecology

## 2018-11-14 ENCOUNTER — Encounter (HOSPITAL_COMMUNITY): Payer: Self-pay | Admitting: *Deleted

## 2018-11-14 DIAGNOSIS — O4703 False labor before 37 completed weeks of gestation, third trimester: Secondary | ICD-10-CM | POA: Diagnosis not present

## 2018-11-14 DIAGNOSIS — Z3A26 26 weeks gestation of pregnancy: Secondary | ICD-10-CM | POA: Diagnosis not present

## 2018-11-14 DIAGNOSIS — Z79899 Other long term (current) drug therapy: Secondary | ICD-10-CM | POA: Insufficient documentation

## 2018-11-14 DIAGNOSIS — F17210 Nicotine dependence, cigarettes, uncomplicated: Secondary | ICD-10-CM | POA: Diagnosis not present

## 2018-11-14 DIAGNOSIS — O99332 Smoking (tobacco) complicating pregnancy, second trimester: Secondary | ICD-10-CM | POA: Diagnosis not present

## 2018-11-14 DIAGNOSIS — O4702 False labor before 37 completed weeks of gestation, second trimester: Secondary | ICD-10-CM | POA: Diagnosis not present

## 2018-11-14 LAB — URINALYSIS, ROUTINE W REFLEX MICROSCOPIC
Bilirubin Urine: NEGATIVE
Glucose, UA: NEGATIVE mg/dL
Hgb urine dipstick: NEGATIVE
Ketones, ur: NEGATIVE mg/dL
Leukocytes,Ua: NEGATIVE
Nitrite: NEGATIVE
Protein, ur: NEGATIVE mg/dL
Specific Gravity, Urine: 1.024 (ref 1.005–1.030)
pH: 6 (ref 5.0–8.0)

## 2018-11-14 LAB — WET PREP, GENITAL
Clue Cells Wet Prep HPF POC: NONE SEEN
Sperm: NONE SEEN
Trich, Wet Prep: NONE SEEN
Yeast Wet Prep HPF POC: NONE SEEN

## 2018-11-14 NOTE — MAU Note (Addendum)
Reports she has been contracting for the past few hours-now every 4 minutes.  No LOF/VB.  States she gets her care in [place]-has a short cervix (1.9 cm last u/s).  No FM for the past hour.

## 2018-11-14 NOTE — MAU Provider Note (Signed)
History     CSN: [id number]  Arrival date and time: 11/14/18 2209   None     Chief Complaint  Patient presents with  . Contractions   Alexis [name] is a 23 yo G2P1001 at 26.1 EGA who is presenting for contractions. They started at 10 PM, they occur every 4 minutes and last a 2-3 minutes. Denies vaginal bleeding, leakage of fluid, or discharge. She was feeling decreased fetal movement, but reports that she has felt baby move since she has been on the monitor.  She follows with [facility] for her OB care. Transvaginal US on 9/13 showed a cervical length of 1.9 cm, for which she is on vaginal progesterone, her last dose was last night.    OB History    Gravida  2   Para  1   Term  1   Preterm      AB      Living  1     SAB      TAB      Ectopic      Multiple      Live Births              Past Medical History:  Diagnosis Date  . Chlamydia   . Trichomonas infection     Past Surgical History:  Procedure Laterality Date  . NO PAST SURGERIES      History reviewed. No pertinent family history.  Social History   Tobacco Use  . Smoking status: Former Smoker    Packs/day: 0.25    Types: Cigarettes  . Smokeless tobacco: Never Used  Substance Use Topics  . Alcohol use: No  . Drug use: Not Currently    Allergies: No Known Allergies  Medications Prior to Admission  Medication Sig Dispense Refill Last Dose  . butalbital-acetaminophen-caffeine (FIORICET) 50-325-40 MG tablet Take by mouth 2 (two) times daily as needed for headache.   11/14/2018 at Unknown time  . cyclobenzaprine (FLEXERIL) 10 MG tablet Take 1 tablet (10 mg total) by mouth 3 (three) times daily as needed for muscle spasms. 30 tablet 0 11/14/2018 at Unknown time  . ondansetron (ZOFRAN ODT) 4 MG disintegrating tablet Take 1-2 tablets (4-8 mg total) by mouth every 6 (six) hours as needed for nausea or vomiting. 20 tablet 0 11/14/2018 at Unknown time  . Prenatal Vit-Fe Fumarate-FA (PRENATAL  COMPLETE) 14-0.4 MG TABS Take 1 tablet by mouth daily. 60 each 0 11/14/2018 at Unknown time  . PROGESTERONE VA Place vaginally.   11/13/2018 at Unknown time    Review of Systems  All other systems reviewed and are negative.  Physical Exam   Blood pressure 125/68, pulse (!) 115, temperature 99.1 F (37.3 C), resp. rate 19, weight 88.7 kg, last menstrual period 03/26/2018.  Physical Exam  Nursing note and vitals reviewed. Constitutional: She is oriented to person, place, and time. She appears well-developed and well-nourished.  HENT:  Head: Normocephalic and atraumatic.  Eyes: Pupils are equal, round, and reactive to light. Conjunctivae and EOM are normal.  Neck: Normal range of motion. Neck supple.  Cardiovascular: Normal rate, regular rhythm and normal heart sounds.  Respiratory: Effort normal and breath sounds normal.  GI: Soft. Bowel sounds are normal. She exhibits no distension and no mass. There is no abdominal tenderness. There is no rebound and no guarding.  Genitourinary:    Vagina normal.     No vaginal discharge.     Genitourinary Comments: SVE: 0.5/thick/-3 External Os funneling   Musculoskeletal:  Normal range of motion.  Neurological: She is alert and oriented to person, place, and time.  Skin: Skin is warm and dry.  Psychiatric: She has a normal mood and affect. Her behavior is normal.    MAU Course  Procedures  MDM -NST to be done for DFM -Observation for 1-2 hours with repeat SVE to r/o preterm labor -unable to do fFN as vaginal progesterone given w/in the last 24 hours -UA, UC, Wet Prep  Reassessment -NST reactive -Uterine irritability noted on TOCO -SVE unchanged  Case discussed with Dr. Harolyn Rutherford, agrees with recommendation to start Procardia for pre-term contractions  Assessment and Plan  23 yo G2P1001 at 26.2 EGA presenting for pre-term contractions. -on vaginal progesterone 2/2 short cervical length -will start Procardia for pre-term  contractions -return precautions given -advised close follow up with her OB provider  [name] 11/14/2018, 11:12 PM

## 2018-11-15 MED ORDER — NIFEDIPINE ER OSMOTIC RELEASE 30 MG PO TB24
30.0000 mg | ORAL_TABLET | Freq: Every day | ORAL | Status: AC
  Administered 2018-11-15: 30 mg via ORAL
  Filled 2018-11-15: qty 1

## 2018-11-15 MED ORDER — NIFEDIPINE ER OSMOTIC RELEASE 30 MG PO TB24: 30.0000 mg | ORAL_TABLET | Freq: Every day | ORAL | 11 refills | Status: DC

## 2018-11-15 NOTE — Discharge Instructions (Signed)
Preterm Labor and Birth Information °Pregnancy normally lasts 39-41 weeks. Preterm labor is when labor starts early. It starts before you have been pregnant for 37 whole weeks. °What are the risk factors for preterm labor? °Preterm labor is more likely to occur in women who: °· Have an infection while pregnant. °· Have a cervix that is short. °· Have gone into preterm labor before. °· Have had surgery on their cervix. °· Are younger than age 25. °· Are older than age 35. °· Are African American. °· Are pregnant with two or more babies. °· Take street drugs while pregnant. °· Smoke while pregnant. °· Do not gain enough weight while pregnant. °· Got pregnant right after another pregnancy. °What are the symptoms of preterm labor? °Symptoms of preterm labor include: °· Cramps. The cramps may feel like the cramps some women get during their period. The cramps may happen with watery poop (diarrhea). °· Pain in the belly (abdomen). °· Pain in the lower back. °· Regular contractions or tightening. It may feel like your belly is getting tighter. °· Pressure in the lower belly that seems to get stronger. °· More fluid (discharge) leaking from the vagina. The fluid may be watery or bloody. °· Water breaking. °Why is it important to notice signs of preterm labor? °Babies who are born early may not be fully developed. They have a higher chance for: °· Long-term heart problems. °· Long-term lung problems. °· Trouble controlling body systems, like breathing. °· Bleeding in the brain. °· A condition called cerebral palsy. °· Learning difficulties. °· Death. °These risks are highest for babies who are born before 34 weeks of pregnancy. °How is preterm labor treated? °Treatment depends on: °· How long you were pregnant. °· Your condition. °· The health of your baby. °Treatment may involve: °· Having a stitch (suture) placed in your cervix. When you give birth, your cervix opens so the baby can come out. The stitch keeps the cervix  from opening too soon. °· Staying at the hospital. °· Taking or getting medicines, such as: °? Hormone medicines. °? Medicines to stop contractions. °? Medicines to help the baby’s lungs develop. °? Medicines to prevent your baby from having cerebral palsy. °What should I do if I am in preterm labor? °If you think you are going into labor too soon, call your doctor right away. °How can I prevent preterm labor? °· Do not use any tobacco products. °? Examples of these are cigarettes, chewing tobacco, and e-cigarettes. °? If you need help quitting, ask your doctor. °· Do not use street drugs. °· Do not use any medicines unless you ask your doctor if they are safe for you. °· Talk with your doctor before taking any herbal supplements. °· Make sure you gain enough weight. °· Watch for infection. If you think you might have an infection, get it checked right away. °· If you have gone into preterm labor before, tell your doctor. °This information is not intended to replace advice given to you by your health care provider. Make sure you discuss any questions you have with your health care provider. °Document Released: 05/06/2008 Document Revised: 06/01/2018 Document Reviewed: 07/01/2015 °Elsevier Patient Education © 2020 Elsevier Inc. ° °

## 2018-12-04 ENCOUNTER — Encounter (HOSPITAL_COMMUNITY): Payer: Self-pay | Admitting: *Deleted

## 2018-12-04 ENCOUNTER — Inpatient Hospital Stay (HOSPITAL_COMMUNITY)
Admission: AD | Admit: 2018-12-04 | Discharge: 2018-12-04 | Disposition: A | Discharge status: Home / Self Care | Payer: Medicaid Other | Attending: Obstetrics and Gynecology | Admitting: Obstetrics and Gynecology

## 2018-12-04 ENCOUNTER — Other Ambulatory Visit: Payer: Self-pay

## 2018-12-04 DIAGNOSIS — O212 Late vomiting of pregnancy: Secondary | ICD-10-CM | POA: Insufficient documentation

## 2018-12-04 DIAGNOSIS — Z3A29 29 weeks gestation of pregnancy: Secondary | ICD-10-CM | POA: Insufficient documentation

## 2018-12-04 DIAGNOSIS — B373 Candidiasis of vulva and vagina: Secondary | ICD-10-CM | POA: Insufficient documentation

## 2018-12-04 DIAGNOSIS — O26893 Other specified pregnancy related conditions, third trimester: Secondary | ICD-10-CM | POA: Diagnosis present

## 2018-12-04 DIAGNOSIS — R102 Pelvic and perineal pain: Secondary | ICD-10-CM | POA: Insufficient documentation

## 2018-12-04 DIAGNOSIS — R109 Unspecified abdominal pain: Secondary | ICD-10-CM | POA: Diagnosis not present

## 2018-12-04 DIAGNOSIS — O98813 Other maternal infectious and parasitic diseases complicating pregnancy, third trimester: Secondary | ICD-10-CM | POA: Diagnosis not present

## 2018-12-04 DIAGNOSIS — O4693 Antepartum hemorrhage, unspecified, third trimester: Secondary | ICD-10-CM | POA: Diagnosis not present

## 2018-12-04 DIAGNOSIS — Z79899 Other long term (current) drug therapy: Secondary | ICD-10-CM | POA: Diagnosis not present

## 2018-12-04 DIAGNOSIS — B3731 Acute candidiasis of vulva and vagina: Secondary | ICD-10-CM

## 2018-12-04 LAB — URINALYSIS, ROUTINE W REFLEX MICROSCOPIC
Bilirubin Urine: NEGATIVE
Glucose, UA: NEGATIVE mg/dL
Hgb urine dipstick: NEGATIVE
Ketones, ur: NEGATIVE mg/dL
Leukocytes,Ua: NEGATIVE
Nitrite: NEGATIVE
Protein, ur: 30 mg/dL — AB
Specific Gravity, Urine: 1.024 (ref 1.005–1.030)
pH: 6 (ref 5.0–8.0)

## 2018-12-04 LAB — WET PREP, GENITAL
Clue Cells Wet Prep HPF POC: NONE SEEN
Sperm: NONE SEEN
Trich, Wet Prep: NONE SEEN

## 2018-12-04 MED ORDER — TERCONAZOLE 0.8 % VA CREA: 1.0000 | TOPICAL_CREAM | Freq: Every day | VAGINAL | 0 refills | Status: DC

## 2018-12-04 NOTE — Discharge Instructions (Signed)
[place] for [place] at [facility]       Phone: [phone]  [facility] [place] [facility] [place] Phone: [place] for [place] at [place]  Phone: [place] for [place] at [facility]  Phone: [place] for [place] at [place]  Phone: [facility]       Phone: [phone]  [facility]    Phone: [phone]   [facility])    Phone: [facility]    Phone: [phone]  [facility]    Phone: [place] [facility]  Phone: [place]    Phone: [phone]  [facility]   Phone: [phone]  [facility]    Phone: [phone]   Safe Medications in Pregnancy   Acne: Benzoyl Peroxide Salicylic Acid  Backache/Headache: Tylenol: 2 regular strength every 4 hours OR              2 Extra strength every 6 hours  Colds/Coughs/Allergies: Benadryl (alcohol free) 25 mg every 6 hours as needed Breath right strips Claritin Cepacol throat lozenges Chloraseptic throat spray Cold-Eeze- up to three times per day Cough drops, alcohol free Flonase (by prescription only) Guaifenesin Mucinex Robitussin DM (plain only, alcohol free) Saline nasal spray/drops Sudafed (pseudoephedrine) & Actifed ** use only after [redacted] weeks gestation and if you do not have high blood pressure Tylenol Vicks Vaporub Zinc lozenges Zyrtec   Constipation: Colace Ducolax suppositories Fleet enema Glycerin suppositories Metamucil Milk of magnesia Miralax Senokot Smooth move tea  Diarrhea: Kaopectate Imodium A-D  *NO pepto Bismol  Hemorrhoids: Anusol Anusol HC Preparation  H Tucks  Indigestion: Tums Maalox Mylanta Zantac  Pepcid  Insomnia: Benadryl (alcohol free) 25mg  every 6 hours as needed Tylenol PM Unisom, no Gelcaps  Leg Cramps: Tums MagGel  Nausea/Vomiting:  Bonine Dramamine Emetrol Ginger extract Sea bands Meclizine  Nausea medication to take during pregnancy:  Unisom (doxylamine succinate 25 mg tablets) Take one tablet daily at bedtime. If symptoms are not adequately controlled, the dose can be increased to a maximum recommended dose of two tablets daily (1/2 tablet in the morning, 1/2 tablet mid-afternoon and one at bedtime). Vitamin B6 100mg  tablets. Take one tablet twice a day (up to 200 mg per day).  Skin Rashes: Aveeno products Benadryl cream or 25mg  every 6 hours as needed Calamine Lotion 1% cortisone cream  Yeast infection: Gyne-lotrimin 7 Monistat 7  Gum/tooth pain: Anbesol  **If taking multiple medications, please check labels to avoid duplicating the same active ingredients **take medication as directed on the label ** Do not exceed 4000 mg of tylenol in 24 hours **Do not take medications that contain aspirin or ibuprofen        Abdominal Pain During Pregnancy  Belly (abdominal) pain is common during pregnancy. There are many possible causes. Most of the time, it is not a serious problem. Other times, it can be a sign that something is wrong with the pregnancy. Always tell your doctor if you have belly pain. Follow these instructions at home:  Do not have sex or put anything in your vagina until your pain goes away completely.  Get plenty of rest until your pain gets better.  Drink enough fluid to keep your pee (urine) pale yellow.  Take over-the-counter and prescription medicines only as told by your  doctor.  Keep all follow-up visits as told by your doctor. This is important. Contact a doctor if:  Your pain continues or gets worse after resting.  You have lower belly pain that: ? Comes and  goes at regular times. ? Spreads to your back. ? Feels like menstrual cramps.  You have pain or burning when you pee (urinate). Get help right away if:  You have a fever or chills.  You have vaginal bleeding.  You are leaking fluid from your vagina.  You are passing tissue from your vagina.  You throw up (vomit) for more than 24 hours.  You have watery poop (diarrhea) for more than 24 hours.  Your baby is moving less than usual.  You feel very weak or faint.  You have shortness of breath.  You have very bad pain in your upper belly. Summary  Belly (abdominal) pain is common during pregnancy. There are many possible causes.  If you have belly pain during pregnancy, tell your doctor right away.  Keep all follow-up visits as told by your doctor. This is important. This information is not intended to replace advice given to you by your health care provider. Make sure you discuss any questions you have with your health care provider. Document Released: 01/26/2009 Document Revised: 05/28/2018 Document Reviewed: 05/12/2016 Elsevier Patient Education  2020 [employer].

## 2018-12-04 NOTE — MAU Note (Signed)
.   Alexis [name] is a 23 y.o. at [redacted]w[redacted]d here in MAU reporting: that she had vaginal bleeding dark red in color when she was at work and went to the bathroom Pt also states that she has had lower abdominal cramping since then  Onset of complaint: 1115 today Pain score: 10 Vitals:   12/04/18 1244  BP: 119/64  Pulse: 96  Resp: 16  Temp: 98.4 F (36.9 C)  SpO2: 100%     FHT:145 Lab orders placed from triage: UA

## 2018-12-04 NOTE — MAU Provider Note (Addendum)
Chief Complaint:  Vaginal Bleeding and Abdominal Pain   First Provider Initiated Contact with Patient 12/04/18 1257     HPI  Alexis [name] is a 23 y.o. G2P1001 at 43w0dwho presents to maternity admissions reporting vaginal bleeding and 10/10 lower abdominal/pelvic pain since 11:15 today (10/13). She states that she noticed vaginal bleeding while at work after using the restroom. There was no blood in the toilet and she reports that she only saw dark red blood after wiping. Her pain began ~1-2 hours after the bleeding. She denies contraction sensations but describes her pain as sharp and constant.   On further discussion, she states that she has had nausea and vomiting for 3 days. She was unable to eat since Sat 10/10 until the evening of 10/12 when she was able to keep down a small meal. She has not eaten since this time. She tried taking zofran and diclegis which helped some but did not entirely resolve her symptoms. She also reports difficulty urinating for ~2 weeks but denies any dysuria or hematuria at this time.   She reports good fetal movement, denies LOF, vaginal bleeding, vaginal itching/burning, dizziness, diarrhea, constipation or fever/chills. She denies headache, visual changes or RUQ abdominal pain.   Per chart review, she was admitted for a short hospital stay at [facility] in [facility] on 9/03 for cervical incompetence and was monitored for 2 days. She was provided BMZx2 and vaginal progesterone. She has had numerous abnormal and variable cervical lengths per ultrasound since this admission: No measurable Cvx (9/8), Cvx length 1.9cm (9/13), and Cvx length 2.3cm (9/29). All per [facility].   She was also seen on 9/23 in the MAU for contractions lasting 2-3 minutes every 4 minutes and decreased fetal movement. She was discharged in stable condition with procardia which she stated helped. She has not taken procardia since 10/11.     Past Medical History: Past  Medical History:  Diagnosis Date  . Chlamydia   . Trichomonas infection     Past obstetric history: OB History  Gravida Para Term Preterm AB Living  2 1 1     1   SAB TAB Ectopic Multiple Live Births               # Outcome Date GA Lbr Len/2nd Weight Sex Delivery Anes PTL Lv  2 Current           1 Term 10/27/09 [redacted]w[redacted]d   M Vag-Spont       Past Surgical History: Past Surgical History:  Procedure Laterality Date  . NO PAST SURGERIES      Family History: History reviewed. No pertinent family history.  Social History: Social History   Tobacco Use  . Smoking status: Former Smoker    Packs/day: 0.25    Types: Cigarettes  . Smokeless tobacco: Never Used  Substance Use Topics  . Alcohol use: No  . Drug use: Not Currently    Allergies: No Known Allergies  Meds:  Medications Prior to Admission  Medication Sig Dispense Refill Last Dose  . butalbital-acetaminophen-caffeine (FIORICET) 50-325-40 MG tablet Take by mouth 2 (two) times daily as needed for headache.   Past Week at Unknown time  . NIFEdipine (PROCARDIA-XL/NIFEDICAL-XL) 30 MG 24 hr tablet Take 1 tablet (30 mg total) by mouth daily. 30 tablet 11 Past Week at Unknown time  . ondansetron (ZOFRAN ODT) 4 MG disintegrating tablet Take 1-2 tablets (4-8 mg total) by mouth every 6 (six) hours as needed for nausea or  vomiting. 20 tablet 0 12/04/2018 at Unknown time  . Prenatal Vit-Fe Fumarate-FA (PRENATAL COMPLETE) 14-0.4 MG TABS Take 1 tablet by mouth daily. 60 each 0 12/04/2018 at Unknown time  . PROGESTERONE VA Place vaginally.   12/03/2018 at Unknown time  . cyclobenzaprine (FLEXERIL) 10 MG tablet Take 1 tablet (10 mg total) by mouth 3 (three) times daily as needed for muscle spasms. 30 tablet 0     I have reviewed patient's Past Medical Hx, Surgical Hx, Family Hx, Social Hx, medications and allergies.   ROS:  Review of Systems  Constitutional: Negative for fever.  Respiratory: Negative for cough and shortness of breath.    Cardiovascular: Negative for chest pain, palpitations and leg swelling.  Gastrointestinal: Positive for abdominal pain, nausea and vomiting. Negative for abdominal distention, anal bleeding, blood in stool, constipation, diarrhea and rectal pain.  Genitourinary: Positive for difficulty urinating and pelvic pain. Negative for dyspareunia, dysuria, frequency, hematuria and urgency.  Musculoskeletal: Negative for back pain.  Neurological: Negative for dizziness, seizures, weakness, light-headedness and headaches.   Other systems negative  Physical Exam   Patient Vitals for the past 24 hrs:  BP Temp Pulse Resp SpO2 Weight  12/04/18 1244 119/64 98.4 F (36.9 C) 96 16 100 % 88.1 kg   Constitutional: Well-developed, well-nourished female in no acute distress.  Cardiovascular: normal rate and rhythm Respiratory: normal effort, clear to auscultation bilaterally GI: Abd soft, non-tender, gravid appropriate for gestational age.   No rebound or guarding. MS: Extremities nontender, no edema, normal ROM Neurologic: Alert and oriented x 4.  GU: Neg CVAT.  PELVIC EXAM: Cervix pink, visually closed, without lesion, large amounts of white creamy discharge, vaginal walls and external genitalia normal Bimanual exam: Cervix firm, posterior, neg CMT, uterus nontender, Fundal Height consistent with dates, adnexa without tenderness, enlargement, or mass    FHT:  Baseline ~145 bpm, moderate variability,, no decelerations Contractions: None seen   Labs: Results for orders placed or performed during the hospital encounter of 12/04/18 (from the past 24 hour(s))  Urinalysis, Routine w reflex microscopic     Status: Abnormal   Collection Time: 12/04/18  1:18 PM  Result Value Ref Range   Color, Urine YELLOW YELLOW   APPearance CLOUDY (A) CLEAR   Specific Gravity, Urine 1.024 1.005 - 1.030   pH 6.0 5.0 - 8.0   Glucose, UA NEGATIVE NEGATIVE mg/dL   Hgb urine dipstick NEGATIVE NEGATIVE   Bilirubin Urine  NEGATIVE NEGATIVE   Ketones, ur NEGATIVE NEGATIVE mg/dL   Protein, ur 30 (A) NEGATIVE mg/dL   Nitrite NEGATIVE NEGATIVE   Leukocytes,Ua NEGATIVE NEGATIVE   RBC / HPF 0-5 0 - 5 RBC/hpf   WBC, UA 0-5 0 - 5 WBC/hpf   Bacteria, UA RARE (A) NONE SEEN   Squamous Epithelial / LPF 6-10 0 - 5   Mucus PRESENT   Wet prep, genital     Status: Abnormal   Collection Time: 12/04/18  1:55 PM   Specimen: PATH Cytology Cervicovaginal Ancillary Only  Result Value Ref Range   Yeast Wet Prep HPF POC PRESENT (A) NONE SEEN   Trich, Wet Prep NONE SEEN NONE SEEN   Clue Cells Wet Prep HPF POC NONE SEEN NONE SEEN   WBC, Wet Prep HPF POC MODERATE (A) NONE SEEN   Sperm NONE SEEN    --/--/O POS (05/04 6578)  Imaging:  No results found.  MAU Course/MDM: I have ordered labs and reviewed results for UTI vs vaginal infection workup given pelvic pain  and vaginal bleeding. NST reviewed and results are wnl and consistent with a healthy pregnancy.   UA was negative for signs of infection. Vaginal swab positive for yeast and WBC, otherwise negative for trich and clue cells. We will treat for yeast infection given these findings.   Assessment: 1. Vaginal yeast infection   2. Abdominal pain during pregnancy in third trimester   3. [redacted] weeks gestation of pregnancy     Plan: Discharge home Terazole course for yeast infection Labor precautions and fetal kick counts Follow up in office for prenatal visits and recheck, patient desires to transfer care closer to home in [place], will provide list of offices Provide list of safe medications to take in pregnancy  Encouraged patient to return to MAU or other provider if she experiences heavy vaginal bleeding, unremitting n/v, abdominal pain not resolved with medication, headache not alleviated with medication, changes to vision, SOB, RUQ abdominal pain, marked peripheral edema, or any other concerning features.    Follow-up Information    [facility] Follow up.   Specialty: Obstetrics and Gynecology Why: return for worsening symptoms Contact information: [address] [id number] [place] [id number] [phone]          Pt stable at time of discharge.  Herbie Saxonnna Dodson, MS3 12/04/2018 2:54 PM    I confirm that I have verified the information documented in the medical student's note and that I have also personally performed the history, physical exam and all medical decision making activities of this service and have verified that all service and findings are accurately documented in this student's note.   No ctx on monitor. Abdomen soft & non tender. No blood on exam. Cervix 0.5/thick (unchanged from 9/23 exam). RH positive.  No signs of infection or dehydration per u/a.  Pt given list of [facility] since she lives in [place] & would like to deliver here.   Wet prep + yeast. Rx terazol.  GC/CT pending  Discussed reasons to return to MAU  Judeth HornLawrence, Rithika Seel, NP 12/04/2018 3:43 PM

## 2018-12-05 ENCOUNTER — Encounter (HOSPITAL_COMMUNITY): Payer: Self-pay

## 2018-12-05 ENCOUNTER — Ambulatory Visit (HOSPITAL_COMMUNITY)
Admission: EM | Admit: 2018-12-05 | Discharge: 2018-12-05 | Disposition: A | Discharge status: Home / Self Care | Payer: Medicaid Other | Attending: Family Medicine | Admitting: Family Medicine

## 2018-12-05 DIAGNOSIS — K047 Periapical abscess without sinus: Secondary | ICD-10-CM | POA: Diagnosis not present

## 2018-12-05 MED ORDER — PENICILLIN G BENZATHINE 1200000 UNIT/2ML IM SUSP
INTRAMUSCULAR | Status: AC
  Filled 2018-12-05: qty 2

## 2018-12-05 MED ORDER — PENICILLIN V POTASSIUM 500 MG PO TABS: 500.0000 mg | ORAL_TABLET | Freq: Four times a day (QID) | ORAL | 0 refills | Status: DC

## 2018-12-05 MED ORDER — PENICILLIN G BENZATHINE 1200000 UNIT/2ML IM SUSP
1.2000 10*6.[IU] | Freq: Once | INTRAMUSCULAR | Status: AC
  Administered 2018-12-05: 1.2 10*6.[IU] via INTRAMUSCULAR

## 2018-12-05 MED ORDER — HYDROCODONE-ACETAMINOPHEN 5-325 MG PO TABS
ORAL_TABLET | ORAL | Status: AC
  Filled 2018-12-05: qty 1

## 2018-12-05 MED ORDER — HYDROCODONE-ACETAMINOPHEN 5-325 MG PO TABS
1.0000 | ORAL_TABLET | Freq: Once | ORAL | Status: AC
  Administered 2018-12-05: 1 via ORAL

## 2018-12-05 NOTE — Discharge Instructions (Signed)
Treating you for a dental infection here with penicillin injection.  Hydrocodone for pain here.  Tylenol as needed. You can take 1000 mg every 8 hours for pain.  Follow up with dentist.

## 2018-12-05 NOTE — ED Provider Notes (Signed)
[facility]    CSN: [id number] Arrival date & time: 12/05/18  0840      History   [id number] Complaint Chief Complaint  Patient presents with  . Dental Pain    HPI Alexis [name] is a 23 y.o. female.    Dental Pain Location:  Lower Quality:  Aching and throbbing Severity:  Severe Onset quality:  Gradual Duration:  3 days Timing:  Constant Progression:  Worsening Chronicity:  New Context: abscess and dental caries   Context: cap still on and not dental fracture   Relieved by:  Nothing Ineffective treatments:  Acetaminophen Associated symptoms: facial pain and gum swelling   Associated symptoms: no congestion, no difficulty swallowing, no drooling, no facial swelling, no fever, no headaches, no neck pain, no neck swelling, no oral bleeding, no oral lesions and no trismus   Risk factors: lack of dental care and periodontal disease     Past Medical History:  Diagnosis Date  . Chlamydia   . Trichomonas infection     Patient Active Problem List   Diagnosis Date Noted  . PID (acute pelvic inflammatory disease) 02/23/2016    Past Surgical History:  Procedure Laterality Date  . NO PAST SURGERIES      OB History    Gravida  2   Para  1   Term  1   Preterm      AB      Living  1     SAB      TAB      Ectopic      Multiple      Live Births               Home Medications    Prior to Admission medications   Medication Sig Start Date End Date Taking? Authorizing Provider  butalbital-acetaminophen-caffeine (FIORICET) 50-325-40 MG tablet Take by mouth 2 (two) times daily as needed for headache.    [provider]  NIFEdipine (PROCARDIA-XL/NIFEDICAL-XL) 30 MG 24 hr tablet Take 1 tablet (30 mg total) by mouth daily. 11/15/18 11/15/19  Sparacino, Hailey L, DO  ondansetron (ZOFRAN ODT) 4 MG disintegrating tablet Take 1-2 tablets (4-8 mg total) by mouth every 6 (six) hours as needed for nausea or vomiting. 09/29/18   Gerrit HeckEmly, Jessica, CNM   penicillin v potassium (VEETID) 500 MG tablet Take 1 tablet (500 mg total) by mouth 4 (four) times daily for 7 days. 12/05/18 12/12/18  Janace ArisBast, Lundon Verdejo A, NP  Prenatal Vit-Fe Fumarate-FA (PRENATAL COMPLETE) 14-0.4 MG TABS Take 1 tablet by mouth daily. 06/18/18   Georgetta HaberBurky, Natalie B, NP  PROGESTERONE VA Place vaginally.    [provider]  terconazole (TERAZOL 3) 0.8 % vaginal cream Place 1 applicator vaginally at bedtime. 12/04/18   Judeth HornLawrence, Erin, NP    Family History History reviewed. No pertinent family history.  Social History Social History   Tobacco Use  . Smoking status: Former Smoker    Packs/day: 0.25    Types: Cigarettes  . Smokeless tobacco: Never Used  Substance Use Topics  . Alcohol use: No  . Drug use: Not Currently     Allergies   Patient has no known allergies.   Review of Systems Review of Systems  Constitutional: Negative for fever.  HENT: Negative for congestion, drooling, facial swelling and mouth sores.   Musculoskeletal: Negative for neck pain.  Neurological: Negative for headaches.     Physical Exam Triage Vital Signs ED Triage Vitals  Enc Vitals Group  BP 12/05/18 0855 119/67     Pulse Rate 12/05/18 0855 79     Resp 12/05/18 0855 18     Temp 12/05/18 0855 98.3 F (36.8 C)     Temp Source 12/05/18 0855 Oral     SpO2 12/05/18 0855 100 %     Weight 12/05/18 0854 193 lb (87.5 kg)     Height --      Head Circumference --      Peak Flow --      Pain Score 12/05/18 0854 10     Pain Loc --      Pain Edu? --      Excl. in GC? --    No data found.  Updated Vital Signs BP 119/67 (BP Location: Right Arm)   Pulse 79   Temp 98.3 F (36.8 C) (Oral)   Resp 18   Wt 193 lb (87.5 kg)   LMP 03/26/2018 (Approximate)   SpO2 100%   BMI 32.12 kg/m   Visual Acuity Right Eye Distance:   Left Eye Distance:   Bilateral Distance:    Right Eye Near:   Left Eye Near:    Bilateral Near:     Physical Exam Vitals signs and nursing note  reviewed.  Constitutional:      General: She is not in acute distress.    Appearance: Normal appearance. She is not ill-appearing, toxic-appearing or diaphoretic.     Comments: Appears in pain   HENT:     Head: Normocephalic and atraumatic.     Nose: Nose normal.     Mouth/Throat:     Mouth: Mucous membranes are moist.     Dentition: Abnormal dentition. Dental tenderness, gingival swelling, dental caries and dental abscesses present.     Pharynx: Oropharynx is clear.   Eyes:     Conjunctiva/sclera: Conjunctivae normal.  Neck:     Musculoskeletal: Normal range of motion.  Pulmonary:     Effort: Pulmonary effort is normal.  Musculoskeletal: Normal range of motion.  Skin:    General: Skin is warm and dry.     Findings: No rash.  Neurological:     Mental Status: She is alert.  Psychiatric:        Mood and Affect: Mood normal.      UC Treatments / Results  Labs (all labs ordered are listed, but only abnormal results are displayed) Labs Reviewed - No data to display  EKG   Radiology No results found.  Procedures Procedures (including critical care time)  Medications Ordered in UC Medications  HYDROcodone-acetaminophen (NORCO/VICODIN) 5-325 MG per tablet 1 tablet (has no administration in time range)  penicillin g benzathine (BICILLIN LA) 1200000 UNIT/2ML injection 1.2 Million Units (has no administration in time range)    Initial Impression / Assessment and Plan / UC Course  I have reviewed the triage vital signs and the nursing notes.  Pertinent labs & imaging results that were available during my care of the patient were reviewed by me and considered in my medical decision making (see chart for details).     Dental infection-patient requesting treatment here in clinic.  We will start with penicillin injection and 1 hydrocodone for pain.  Sending prescription for penicillin outpatient for the next 7 days.  She can take Tylenol every 8 hours for pain Dental  resources given Precautions given Final Clinical Impressions(s) / UC Diagnoses   Final diagnoses:  Dental infection     Discharge Instructions     Treating you  for a dental infection here with penicillin injection.  Hydrocodone for pain here.  Tylenol as needed. You can take 1000 mg every 8 hours for pain.  Follow up with dentist.     ED Prescriptions    Medication Sig Dispense Auth. Provider   penicillin v potassium (VEETID) 500 MG tablet Take 1 tablet (500 mg total) by mouth 4 (four) times daily for 7 days. 28 tablet Noah Lembke A, NP     PDMP not reviewed this encounter.   Orvan July, NP 12/05/18 [phone]

## 2018-12-05 NOTE — ED Triage Notes (Signed)
Pt states she has abscess in her mouth. X 3 days.

## 2018-12-07 ENCOUNTER — Other Ambulatory Visit: Payer: Self-pay

## 2018-12-07 ENCOUNTER — Inpatient Hospital Stay (HOSPITAL_COMMUNITY)
Admission: AD | Admit: 2018-12-07 | Discharge: 2018-12-08 | Disposition: A | Discharge status: Home / Self Care | Payer: Medicaid Other | Attending: Obstetrics & Gynecology | Admitting: Obstetrics & Gynecology

## 2018-12-07 DIAGNOSIS — Z3A29 29 weeks gestation of pregnancy: Secondary | ICD-10-CM

## 2018-12-07 DIAGNOSIS — O26893 Other specified pregnancy related conditions, third trimester: Secondary | ICD-10-CM | POA: Insufficient documentation

## 2018-12-07 DIAGNOSIS — K047 Periapical abscess without sinus: Secondary | ICD-10-CM

## 2018-12-07 LAB — GC/CHLAMYDIA PROBE AMP (~~LOC~~) NOT AT ARMC
Chlamydia: NEGATIVE
Comment: NEGATIVE
Comment: NORMAL
Neisseria Gonorrhea: NEGATIVE

## 2018-12-07 NOTE — MAU Note (Signed)
Having pain R lower tooth for a wk and getting worse. R side of neck starting to swell. Toothache causing migraines and unable to eat due to pain. Goes to [facility] who gave pt Hydrocodone which she took about 2315. Denies LOF or vag bleeding. Denies any other concerns

## 2018-12-08 DIAGNOSIS — O26893 Other specified pregnancy related conditions, third trimester: Secondary | ICD-10-CM

## 2018-12-08 DIAGNOSIS — O26892 Other specified pregnancy related conditions, second trimester: Secondary | ICD-10-CM | POA: Diagnosis present

## 2018-12-08 DIAGNOSIS — K047 Periapical abscess without sinus: Secondary | ICD-10-CM

## 2018-12-08 DIAGNOSIS — Z3A29 29 weeks gestation of pregnancy: Secondary | ICD-10-CM | POA: Diagnosis not present

## 2018-12-08 MED ORDER — OXYCODONE-ACETAMINOPHEN 5-325 MG PO TABS: 1.0000 | ORAL_TABLET | ORAL | 0 refills | Status: DC | PRN

## 2018-12-08 MED ORDER — OXYCODONE-ACETAMINOPHEN 5-325 MG PO TABS
1.0000 | ORAL_TABLET | Freq: Once | ORAL | Status: AC
  Administered 2018-12-08: 1 via ORAL
  Filled 2018-12-08: qty 1

## 2018-12-08 MED ORDER — AMOXICILLIN-POT CLAVULANATE 875-125 MG PO TABS: 1.0000 | ORAL_TABLET | Freq: Two times a day (BID) | ORAL | 0 refills | Status: DC

## 2018-12-08 NOTE — MAU Provider Note (Signed)
First Provider Initiated Contact with Patient 12/08/18 0008     S [name] is a 23 y.o. G2P1001 at [redacted]w[redacted]d who presents to MAU today with complaint of dental pain. She was seen in the ED on 10/14 for same complaint and diagnosed with dental abscess. She reports calling a couple of dentist around town that stated they were not able to see her- she denies being given a dental letter from the ED.   She was given antibiotics for dental abscess from ED and reports she is on day 3 of medication without relief. She rates pain 9/10- has taken hydrocodone for pain without relief.   She denies pregnancy complaints. Denies abdominal pain, vaginal bleeding, vaginal discharge, or LOF.   O BP 129/68   Pulse 97   Temp 98.4 F (36.9 C)   Resp 20   Ht 5\' 5"  (1.651 m)   Wt 88.5 kg   LMP 03/26/2018 (Approximate)   BMI 32.45 kg/m  Physical Exam  Constitutional: She is oriented to person, place, and time. She appears well-developed and well-nourished. No distress.  HENT:  Mouth/Throat:    Cardiovascular: Normal rate and regular rhythm.  Respiratory: Effort normal and breath sounds normal.  GI: Soft. There is no abdominal tenderness. There is no rebound and no guarding.  Musculoskeletal: Normal range of motion.  Neurological: She is alert and oriented to person, place, and time.  Psychiatric: She has a normal mood and affect. Her behavior is normal. Thought content normal.   FHR 150 by doppler  Pain medication given in MAU prior to discharge home   A Medical screening exam complete 1. Dental abscess   2. [redacted] weeks gestation of pregnancy     P Discharge from MAU in stable condition Change in therapy course, Rx for Augmentin and Percocet sent to pharmacy of choice  Dental letter given to patient  Patient plans to transfer care to [facility]- list given  Patient may return to MAU as needed for pregnancy related complaints  Lajean Manes, CNM 12/08/2018 1:25 AM

## 2018-12-08 NOTE — MAU Note (Signed)
Darrol Poke CNM in Triage to see pt.

## 2018-12-08 NOTE — Progress Notes (Signed)
Written and verbal d/c instructions given and understanding voiced. Pt has friend with her to driver her home. Has dental note

## 2018-12-25 ENCOUNTER — Ambulatory Visit (HOSPITAL_COMMUNITY)
Admission: EM | Admit: 2018-12-25 | Discharge: 2018-12-25 | Disposition: A | Discharge status: Home / Self Care | Payer: Medicaid Other | Attending: Urgent Care | Admitting: Urgent Care

## 2018-12-25 ENCOUNTER — Other Ambulatory Visit: Payer: Self-pay

## 2018-12-25 ENCOUNTER — Encounter (HOSPITAL_COMMUNITY): Payer: Self-pay | Admitting: Emergency Medicine

## 2018-12-25 DIAGNOSIS — R21 Rash and other nonspecific skin eruption: Secondary | ICD-10-CM

## 2018-12-25 DIAGNOSIS — Z3490 Encounter for supervision of normal pregnancy, unspecified, unspecified trimester: Secondary | ICD-10-CM

## 2018-12-25 DIAGNOSIS — B009 Herpesviral infection, unspecified: Secondary | ICD-10-CM

## 2018-12-25 MED ORDER — ACYCLOVIR 400 MG PO TABS: 400.0000 mg | ORAL_TABLET | Freq: Three times a day (TID) | ORAL | 0 refills | Status: DC

## 2018-12-25 NOTE — ED Triage Notes (Signed)
PT reports swollen red area over inner thigh for 2 days.

## 2018-12-25 NOTE — ED Provider Notes (Signed)
  MRN: [id number] DOB: 1995/10/23  Subjective:   Alexis [name] is a 23 y.o. female presenting for 2-day history of worsening rash over her left inner thigh.  Patient reports that it generally is painful only when she touches it or when it makes contact with the other part of her right thigh.  She denies fever, headache, sore throat, congestion, chest pain, shortness of breath, nausea, vomiting, belly pain.  Patient is currently pregnant and states that she was tested through blood work and urine sample for STIs including herpes simplex virus.  She states that all her testing was negative.  She does remember having shingles years ago when she was incarcerated.  Denies any other known aggravating factors.  Has not tried any medications for relief.     She is only taking prenatal vitamins.    No Known Allergies  Past Medical History:  Diagnosis Date  . Chlamydia   . Trichomonas infection      Past Surgical History:  Procedure Laterality Date  . NO PAST SURGERIES      ROS  Objective:   Vitals: BP 111/61   Pulse (!) 113   Temp 98.1 F (36.7 C) (Oral)   Resp 16   LMP 03/26/2018 (Approximate)   SpO2 100%   Physical Exam Constitutional:      General: She is not in acute distress.    Appearance: Normal appearance. She is well-developed. She is not ill-appearing.  HENT:     Head: Normocephalic and atraumatic.     Nose: Nose normal.     Mouth/Throat:     Mouth: Mucous membranes are moist.     Pharynx: Oropharynx is clear.  Eyes:     General: No scleral icterus.    Extraocular Movements: Extraocular movements intact.     Pupils: Pupils are equal, round, and reactive to light.  Cardiovascular:     Rate and Rhythm: Normal rate.  Pulmonary:     Effort: Pulmonary effort is normal.  Skin:    General: Skin is warm and dry.       Neurological:     General: No focal deficit present.     Mental Status: She is alert and oriented to person, place, and time.  Psychiatric:       Mood and Affect: Mood normal.        Behavior: Behavior normal.         Assessment and Plan :   1. Herpes simplex infection of skin   2. Rash and nonspecific skin eruption   3. Pregnancy, unspecified gestational age     Will start on acyclovir to address herpes simplex rash. Discussed possibility of shingles but there is no dermatome pattern.  Counseled patient that I need her to follow-up with her OB as soon as possible for recheck. Counseled patient on potential for adverse effects with medications prescribed/recommended today, ER and return-to-clinic precautions discussed, patient verbalized understanding.    Jaynee Eagles, PA-C 12/25/18 1650

## 2018-12-25 NOTE — ED Notes (Signed)
PT was treated for oral abscess 10/14. She stopped penicillin and started it again 2-3 days ago when she developed this area on her thigh.

## 2018-12-31 ENCOUNTER — Other Ambulatory Visit: Payer: Self-pay

## 2019-01-01 ENCOUNTER — Other Ambulatory Visit: Payer: Self-pay

## 2019-01-01 ENCOUNTER — Ambulatory Visit (INDEPENDENT_AMBULATORY_CARE_PROVIDER_SITE_OTHER)
Admission: RE | Admit: 2019-01-01 | Discharge: 2019-01-01 | Disposition: A | Discharge status: Home / Self Care | Payer: Medicaid Other | Source: Ambulatory Visit

## 2019-01-01 DIAGNOSIS — K047 Periapical abscess without sinus: Secondary | ICD-10-CM | POA: Diagnosis not present

## 2019-01-01 MED ORDER — CLINDAMYCIN HCL 150 MG PO CAPS: 150.0000 mg | ORAL_CAPSULE | Freq: Four times a day (QID) | ORAL | 0 refills | Status: DC

## 2019-01-01 NOTE — ED Provider Notes (Signed)
Virtual Visit via Video Note:  Alexis [name]  initiated request for Telemedicine visit with [facility] Urgent [facility] team. I connected with Alexis [name]  on 01/01/2019 at 8:52 AM  for a synchronized telemedicine visit using a video enabled HIPPA compliant telemedicine application. I verified that I am speaking with Alexis [name]  using two identifiers. Orvan July, NP  was physically located in a [facility] Urgent care site and Alexis [name] was located at a different location.   The limitations of evaluation and management by telemedicine as well as the availability of in-person appointments were discussed. Patient was informed that she  may incur a bill ( including co-pay) for this virtual visit encounter. Alexis [name]  expressed understanding and gave verbal consent to proceed with virtual visit.     History of Present Illness:Alexis [name]  is a 23 y.o. female presents with chronic dental pain. This has been an ongoing issue for her for months. Has been treated twice for infection with abx and given narcotic pain meds. The problem returned 2 days ago. She has not seen a dentist. Reports they wil no see her because she is pregnant. She has been taking tylenol without relief.  No fever, trismus or facial swelling.    Past Medical History:  Diagnosis Date  . Chlamydia   . Trichomonas infection     No Known Allergies      Observations/Objective: VITALS: Per patient if applicable, see vitals. GENERAL: Alert, appears well and in no acute distress. HEENT: Atraumatic, conjunctiva clear, no obvious abnormalities on inspection of external nose and ears. No facial swelling or trismus  NECK: Normal movements of the head and neck. CARDIOPULMONARY: No increased WOB. Speaking in clear sentences. I:E ratio WNL.  MS: Moves all visible extremities without noticeable abnormality. PSYCH: Pleasant and cooperative, well-groomed. Speech normal rate and rhythm. Affect is appropriate.  Insight and judgement are appropriate. Attention is focused, linear, and appropriate.  NEURO: CN grossly intact. Oriented as arrived to appointment on time with no prompting. Moves both UE equally.  SKIN: No obvious lesions, wounds, erythema, or cyanosis noted on face or hands.    Assessment and Plan:dental pain and infection- treating with clindamycin. She can continue to take the tylenol for pain.  She can follow up with her OB for continued pain but recommended she find a dentist.  Pt understanding and agreed.    Follow Up Instructions:Follow up as needed for continued or worsening symptoms     I discussed the assessment and treatment plan with the patient. The patient was provided an opportunity to ask questions and all were answered. The patient agreed with the plan and demonstrated an understanding of the instructions.   The patient was advised to call back or seek an in-person evaluation if the symptoms worsen or if the condition fails to improve as anticipated.     Orvan July, NP  01/01/2019 8:52 AM         Orvan July, NP 01/01/19 1811

## 2019-01-01 NOTE — Discharge Instructions (Signed)
Treating you  for dental infection Follow up with a dentist.  You can take tylenol for pain

## 2019-01-07 ENCOUNTER — Telehealth: Payer: Medicaid Other

## 2019-01-23 LAB — OB RESULTS CONSOLE GC/CHLAMYDIA: Gonorrhea: NEGATIVE

## 2019-01-26 LAB — OB RESULTS CONSOLE GBS: GBS: NEGATIVE

## 2019-02-01 ENCOUNTER — Encounter (HOSPITAL_COMMUNITY): Payer: Self-pay | Admitting: Obstetrics and Gynecology

## 2019-02-01 ENCOUNTER — Other Ambulatory Visit: Payer: Self-pay

## 2019-02-01 ENCOUNTER — Inpatient Hospital Stay (HOSPITAL_COMMUNITY)
Admission: AD | Admit: 2019-02-01 | Discharge: 2019-02-02 | Disposition: A | Discharge status: Home / Self Care | Payer: Medicaid Other | Attending: Obstetrics and Gynecology | Admitting: Obstetrics and Gynecology

## 2019-02-01 ENCOUNTER — Inpatient Hospital Stay (HOSPITAL_BASED_OUTPATIENT_CLINIC_OR_DEPARTMENT_OTHER): Payer: Medicaid Other

## 2019-02-01 DIAGNOSIS — O368190 Decreased fetal movements, unspecified trimester, not applicable or unspecified: Secondary | ICD-10-CM

## 2019-02-01 DIAGNOSIS — Z87891 Personal history of nicotine dependence: Secondary | ICD-10-CM | POA: Diagnosis not present

## 2019-02-01 DIAGNOSIS — Z3A37 37 weeks gestation of pregnancy: Secondary | ICD-10-CM

## 2019-02-01 DIAGNOSIS — Z3689 Encounter for other specified antenatal screening: Secondary | ICD-10-CM | POA: Diagnosis not present

## 2019-02-01 DIAGNOSIS — O479 False labor, unspecified: Secondary | ICD-10-CM

## 2019-02-01 DIAGNOSIS — Z79899 Other long term (current) drug therapy: Secondary | ICD-10-CM | POA: Diagnosis not present

## 2019-02-01 DIAGNOSIS — O471 False labor at or after 37 completed weeks of gestation: Secondary | ICD-10-CM | POA: Diagnosis not present

## 2019-02-01 DIAGNOSIS — O368130 Decreased fetal movements, third trimester, not applicable or unspecified: Secondary | ICD-10-CM | POA: Diagnosis not present

## 2019-02-01 NOTE — MAU Provider Note (Signed)
History     CSN: [id number]  Arrival date and time: 02/01/19 2047   First Provider Initiated Contact with Patient 02/01/19 2129      Chief Complaint  Patient presents with  . Contractions   23 y.o. G2P1001 @37 .3 wks presenting with ctx. Reports onset today, q8 min. Feeling rectal pressure with ctx. Denies VB or LOF. Reports no FM since she was at [place] this am around 7. She is eating and drinking but not well today.   OB History    Gravida  2   Para  1   Term  1   Preterm      AB      Living  1     SAB      TAB      Ectopic      Multiple      Live Births              Past Medical History:  Diagnosis Date  . Chlamydia   . Trichomonas infection     Past Surgical History:  Procedure Laterality Date  . NO PAST SURGERIES      No family history on file.  Social History   Tobacco Use  . Smoking status: Former Smoker    Packs/day: 0.25    Types: Cigarettes  . Smokeless tobacco: Never Used  Substance Use Topics  . Alcohol use: No  . Drug use: Not Currently    Allergies: No Known Allergies  Medications Prior to Admission  Medication Sig Dispense Refill Last Dose  . acyclovir (ZOVIRAX) 400 MG tablet Take 1 tablet (400 mg total) by mouth 3 (three) times daily. 21 tablet 0 02/01/2019 at Unknown time  . butalbital-acetaminophen-caffeine (FIORICET) 50-325-40 MG tablet Take by mouth 2 (two) times daily as needed for headache.   Past Week at Unknown time  . ondansetron (ZOFRAN ODT) 4 MG disintegrating tablet Take 1-2 tablets (4-8 mg total) by mouth every 6 (six) hours as needed for nausea or vomiting. 20 tablet 0 Past Week at Unknown time  . Prenatal Vit-Fe Fumarate-FA (PRENATAL COMPLETE) 14-0.4 MG TABS Take 1 tablet by mouth daily. 60 each 0 02/01/2019 at Unknown time  . PROGESTERONE VA Place vaginally.   Past Week at Unknown time  . amoxicillin-clavulanate (AUGMENTIN) 875-125 MG tablet Take 1 tablet by mouth every 12 (twelve) hours. 14  tablet 0   . clindamycin (CLEOCIN) 150 MG capsule Take 1 capsule (150 mg total) by mouth every 6 (six) hours. 28 capsule 0   . NIFEdipine (PROCARDIA-XL/NIFEDICAL-XL) 30 MG 24 hr tablet Take 1 tablet (30 mg total) by mouth daily. 30 tablet 11   . oxyCODONE-acetaminophen (PERCOCET/ROXICET) 5-325 MG tablet Take 1 tablet by mouth every 4 (four) hours as needed for severe pain. 10 tablet 0   . terconazole (TERAZOL 3) 0.8 % vaginal cream Place 1 applicator vaginally at bedtime. 20 g 0     Review of Systems  Constitutional: Negative for chills and fever.  Gastrointestinal: Positive for abdominal pain (ctx).  Genitourinary: Negative for vaginal bleeding and vaginal discharge.   Physical Exam   Blood pressure 119/73, pulse 80, temperature 98.3 F (36.8 C), resp. rate 18, last menstrual period 03/26/2018, SpO2 100 %.  Physical Exam  Nursing note and vitals reviewed. Constitutional: She is oriented to person, place, and time. She appears well-developed and well-nourished. No distress.  HENT:  Head: Normocephalic and atraumatic.  Cardiovascular: Normal rate.  Respiratory: Effort normal. No respiratory distress.  GI: Soft.  She exhibits no distension. There is no abdominal tenderness.  Genitourinary:    Genitourinary Comments: SVE 3.5/50/-2 per RN   Musculoskeletal:        General: Normal range of motion.     Cervical back: Normal range of motion.  Neurological: She is alert and oriented to person, place, and time.  Skin: Skin is warm and dry.  Psychiatric: She has a normal mood and affect.  EFM: 140 bpm, mod variability, no accels, no decels Toco: q4-8  No results found for this or any previous visit (from the past 24 hour(s)).  MAU Course  Procedures  MDM Chart review: received care with [facility] (1-Rh), had cervical shortening noted at 18 wks, placed on Prometrium. NST initially NR-BPP 8/8, AFI 13. NST became reactive, FM audible, pt unable to feel. Cervix unchanged after 2  additional exams. Pt stable for discharge home.   Assessment and Plan   1. [redacted] weeks gestation of pregnancy   2. Decreased fetal movement   3. NST (non-stress test) reactive   4. False labor    Discharge home Follow up at [facility] in 4 days as scheduled Labor precautions FMCs  Allergies as of 02/01/2019   No Known Allergies     Medication List    STOP taking these medications   amoxicillin-clavulanate 875-125 MG tablet Commonly known as: AUGMENTIN   clindamycin 150 MG capsule Commonly known as: CLEOCIN   NIFEdipine 30 MG 24 hr tablet Commonly known as: PROCARDIA-XL/NIFEDICAL-XL   oxyCODONE-acetaminophen 5-325 MG tablet Commonly known as: PERCOCET/ROXICET   PROGESTERONE VA   terconazole 0.8 % vaginal cream Commonly known as: Terazol 3     TAKE these medications   acyclovir 400 MG tablet Commonly known as: ZOVIRAX Take 1 tablet (400 mg total) by mouth 3 (three) times daily.   butalbital-acetaminophen-caffeine 50-325-40 MG tablet Commonly known as: FIORICET Take by mouth 2 (two) times daily as needed for headache.   ondansetron 4 MG disintegrating tablet Commonly known as: Zofran ODT Take 1-2 tablets (4-8 mg total) by mouth every 6 (six) hours as needed for nausea or vomiting.   Prenatal Complete 14-0.4 MG Tabs Take 1 tablet by mouth daily.      Donette Larry, CNM 02/01/2019, 11:48 PM

## 2019-02-01 NOTE — MAU Note (Signed)
Pt reports to MAU for ctx every few mins, denies ROM denies VB. Stated she hasnt felt baby move since this morning.

## 2019-02-01 NOTE — Discharge Instructions (Signed)
Braxton Hicks Contractions Contractions of the uterus can occur throughout pregnancy, but they are not always a sign that you are in labor. You may have practice contractions called Braxton Hicks contractions. These false labor contractions are sometimes confused with true labor. What are Braxton Hicks contractions? Braxton Hicks contractions are tightening movements that occur in the muscles of the uterus before labor. Unlike true labor contractions, these contractions do not result in opening (dilation) and thinning of the cervix. Toward the end of pregnancy (32-34 weeks), Braxton Hicks contractions can happen more often and may become stronger. These contractions are sometimes difficult to tell apart from true labor because they can be very uncomfortable. You should not feel embarrassed if you go to the hospital with false labor. Sometimes, the only way to tell if you are in true labor is for your health care provider to look for changes in the cervix. The health care provider will do a physical exam and may monitor your contractions. If you are not in true labor, the exam should show that your cervix is not dilating and your water has not broken. If there are no other health problems associated with your pregnancy, it is completely safe for you to be sent home with false labor. You may continue to have Braxton Hicks contractions until you go into true labor. How to tell the difference between true labor and false labor True labor  Contractions last 30-70 seconds.  Contractions become very regular.  Discomfort is usually felt in the top of the uterus, and it spreads to the lower abdomen and low back.  Contractions do not go away with walking.  Contractions usually become more intense and increase in frequency.  The cervix dilates and gets thinner. False labor  Contractions are usually shorter and not as strong as true labor contractions.  Contractions are usually irregular.  Contractions  are often felt in the front of the lower abdomen and in the groin.  Contractions may go away when you walk around or change positions while lying down.  Contractions get weaker and are shorter-lasting as time goes on.  The cervix usually does not dilate or become thin. Follow these instructions at home:   Take over-the-counter and prescription medicines only as told by your health care provider.  Keep up with your usual exercises and follow other instructions from your health care provider.  Eat and drink lightly if you think you are going into labor.  If Braxton Hicks contractions are making you uncomfortable: ? Change your position from lying down or resting to walking, or change from walking to resting. ? Sit and rest in a tub of warm water. ? Drink enough fluid to keep your urine pale yellow. Dehydration may cause these contractions. ? Do slow and deep breathing several times an hour.  Keep all follow-up prenatal visits as told by your health care provider. This is important. Contact a health care provider if:  You have a fever.  You have continuous pain in your abdomen. Get help right away if:  Your contractions become stronger, more regular, and closer together.  You have fluid leaking or gushing from your vagina.  You pass blood-tinged mucus (bloody show).  You have bleeding from your vagina.  You have low back pain that you never had before.  You feel your baby's head pushing down and causing pelvic pressure.  Your baby is not moving inside you as much as it used to. Summary  Contractions that occur before labor are   called Braxton Hicks contractions, false labor, or practice contractions.  Braxton Hicks contractions are usually shorter, weaker, farther apart, and less regular than true labor contractions. True labor contractions usually become progressively stronger and regular, and they become more frequent.  Manage discomfort from Braxton Hicks contractions  by changing position, resting in a warm bath, drinking plenty of water, or practicing deep breathing. This information is not intended to replace advice given to you by your health care provider. Make sure you discuss any questions you have with your health care provider. Document Released: 06/23/2016 Document Revised: 01/20/2017 Document Reviewed: 06/23/2016 Elsevier Patient Education  2020 [employer] Inc[employer]  

## 2019-02-07 ENCOUNTER — Inpatient Hospital Stay (HOSPITAL_COMMUNITY)
Admission: AD | Admit: 2019-02-07 | Discharge: 2019-02-09 | DRG: 807 | Disposition: A | Discharge status: Home / Self Care | Payer: Medicaid Other | Attending: Obstetrics & Gynecology | Admitting: Obstetrics & Gynecology

## 2019-02-07 ENCOUNTER — Encounter (HOSPITAL_COMMUNITY): Payer: Self-pay | Admitting: Obstetrics & Gynecology

## 2019-02-07 ENCOUNTER — Other Ambulatory Visit: Payer: Self-pay

## 2019-02-07 DIAGNOSIS — O9902 Anemia complicating childbirth: Secondary | ICD-10-CM | POA: Diagnosis present

## 2019-02-07 DIAGNOSIS — O326XX0 Maternal care for compound presentation, not applicable or unspecified: Secondary | ICD-10-CM | POA: Diagnosis present

## 2019-02-07 DIAGNOSIS — Z87891 Personal history of nicotine dependence: Secondary | ICD-10-CM

## 2019-02-07 DIAGNOSIS — Z3A38 38 weeks gestation of pregnancy: Secondary | ICD-10-CM

## 2019-02-07 DIAGNOSIS — D649 Anemia, unspecified: Secondary | ICD-10-CM | POA: Diagnosis present

## 2019-02-07 DIAGNOSIS — Z3689 Encounter for other specified antenatal screening: Secondary | ICD-10-CM | POA: Diagnosis not present

## 2019-02-07 DIAGNOSIS — Z20828 Contact with and (suspected) exposure to other viral communicable diseases: Secondary | ICD-10-CM | POA: Diagnosis present

## 2019-02-07 DIAGNOSIS — Z30430 Encounter for insertion of intrauterine contraceptive device: Secondary | ICD-10-CM | POA: Diagnosis not present

## 2019-02-07 DIAGNOSIS — O26893 Other specified pregnancy related conditions, third trimester: Secondary | ICD-10-CM | POA: Diagnosis present

## 2019-02-07 HISTORY — DX: Anemia, unspecified: D64.9

## 2019-02-07 LAB — TYPE AND SCREEN
ABO/RH(D): O POS
Antibody Screen: NEGATIVE

## 2019-02-07 LAB — POCT FERN TEST: POCT Fern Test: POSITIVE

## 2019-02-07 LAB — CBC
HCT: 31 % — ABNORMAL LOW (ref 36.0–46.0)
Hemoglobin: 10.1 g/dL — ABNORMAL LOW (ref 12.0–15.0)
MCH: 23.7 pg — ABNORMAL LOW (ref 26.0–34.0)
MCHC: 32.6 g/dL (ref 30.0–36.0)
MCV: 72.6 fL — ABNORMAL LOW (ref 80.0–100.0)
Platelets: 301 10*3/uL (ref 150–400)
RBC: 4.27 MIL/uL (ref 3.87–5.11)
RDW: 15.9 % — ABNORMAL HIGH (ref 11.5–15.5)
WBC: 10.4 10*3/uL (ref 4.0–10.5)
nRBC: 0 % (ref 0.0–0.2)

## 2019-02-07 LAB — SARS CORONAVIRUS 2 (TAT 6-24 HRS): SARS Coronavirus 2: NEGATIVE

## 2019-02-07 MED ORDER — LIDOCAINE HCL (PF) 1 % IJ SOLN: 30.0000 mL | INTRAMUSCULAR | Status: DC | PRN

## 2019-02-07 MED ORDER — EPHEDRINE 5 MG/ML INJ: 10.0000 mg | INTRAVENOUS | Status: DC | PRN

## 2019-02-07 MED ORDER — OXYTOCIN 40 UNITS IN NORMAL SALINE INFUSION - SIMPLE MED
1.0000 m[IU]/min | INTRAVENOUS | Status: DC
  Administered 2019-02-07: 2 m[IU]/min via INTRAVENOUS
  Filled 2019-02-07: qty 1000

## 2019-02-07 MED ORDER — LACTATED RINGERS IV SOLN
500.0000 mL | Freq: Once | INTRAVENOUS | Status: AC
  Administered 2019-02-08: 500 mL via INTRAVENOUS

## 2019-02-07 MED ORDER — LACTATED RINGERS IV SOLN: 500.0000 mL | INTRAVENOUS | Status: DC | PRN

## 2019-02-07 MED ORDER — FENTANYL-BUPIVACAINE-NACL 0.5-0.125-0.9 MG/250ML-% EP SOLN
12.0000 mL/h | EPIDURAL | Status: DC | PRN
  Filled 2019-02-07: qty 250

## 2019-02-07 MED ORDER — LEVONORGESTREL 19.5 MCG/DAY IU IUD
INTRAUTERINE_SYSTEM | Freq: Once | INTRAUTERINE | Status: AC
  Administered 2019-02-08: 1 via INTRAUTERINE
  Filled 2019-02-07: qty 1

## 2019-02-07 MED ORDER — MISOPROSTOL 50MCG HALF TABLET
ORAL_TABLET | ORAL | Status: AC
  Filled 2019-02-07: qty 1

## 2019-02-07 MED ORDER — OXYCODONE-ACETAMINOPHEN 5-325 MG PO TABS: 2.0000 | ORAL_TABLET | ORAL | Status: DC | PRN

## 2019-02-07 MED ORDER — PHENYLEPHRINE 40 MCG/ML (10ML) SYRINGE FOR IV PUSH (FOR BLOOD PRESSURE SUPPORT): 80.0000 ug | PREFILLED_SYRINGE | INTRAVENOUS | Status: DC | PRN

## 2019-02-07 MED ORDER — MISOPROSTOL 50MCG HALF TABLET
50.0000 ug | ORAL_TABLET | ORAL | Status: DC | PRN
  Administered 2019-02-07: 15:00:00 50 ug via ORAL

## 2019-02-07 MED ORDER — TERBUTALINE SULFATE 1 MG/ML IJ SOLN: 0.2500 mg | Freq: Once | INTRAMUSCULAR | Status: DC | PRN

## 2019-02-07 MED ORDER — OXYTOCIN 40 UNITS IN NORMAL SALINE INFUSION - SIMPLE MED
2.5000 [IU]/h | INTRAVENOUS | Status: DC
  Administered 2019-02-08: 06:00:00 2.5 [IU]/h via INTRAVENOUS

## 2019-02-07 MED ORDER — SOD CITRATE-CITRIC ACID 500-334 MG/5ML PO SOLN: 30.0000 mL | ORAL | Status: DC | PRN

## 2019-02-07 MED ORDER — OXYTOCIN BOLUS FROM INFUSION
500.0000 mL | Freq: Once | INTRAVENOUS | Status: AC
  Administered 2019-02-08: 05:00:00 500 mL via INTRAVENOUS

## 2019-02-07 MED ORDER — ONDANSETRON HCL 4 MG/2ML IJ SOLN
4.0000 mg | Freq: Four times a day (QID) | INTRAMUSCULAR | Status: DC | PRN
  Administered 2019-02-08: 04:00:00 4 mg via INTRAVENOUS
  Filled 2019-02-07: qty 2

## 2019-02-07 MED ORDER — OXYCODONE-ACETAMINOPHEN 5-325 MG PO TABS: 1.0000 | ORAL_TABLET | ORAL | Status: DC | PRN

## 2019-02-07 MED ORDER — LACTATED RINGERS IV SOLN: INTRAVENOUS | Status: DC

## 2019-02-07 MED ORDER — DIPHENHYDRAMINE HCL 50 MG/ML IJ SOLN: 12.5000 mg | INTRAMUSCULAR | Status: DC | PRN

## 2019-02-07 MED ORDER — ACETAMINOPHEN 325 MG PO TABS: 650.0000 mg | ORAL_TABLET | ORAL | Status: DC | PRN

## 2019-02-07 NOTE — H&P (Addendum)
OBSTETRIC ADMISSION HISTORY AND PHYSICAL  Riven Beebe is a 23 y.o. female G2P1001 with IUP at [redacted]w[redacted]d presenting for SROM at 1100 today. She reports +FMs. No LOF, VB, blurry vision, headaches, peripheral edema, or RUQ pain. She plans on breast and bottle feeding. She requests post placental IUD for birth control.  Dating: By U/S --->  Estimated Date of Delivery: 02/19/19  Sono:  02/01/2019 @[redacted]w[redacted]d , anterior placenta, cephalic presentation, BPP 8/8  Sono 10/26/2018 At [redacted]w[redacted]d at [facility], single live IUP, cephalic, 56%, no gross fetal anomalies   EFW by Leopold's 6#   Prenatal History/Complications: Short cervix on vaginal progesterone during pregnancy Shingles (10/2018) treated with Acyclovir   Past Medical History: Past Medical History:  Diagnosis Date  . Anemia   . Chlamydia   . Shingles outbreak 10/2018  . Trichomonas infection     Past Surgical History: Past Surgical History:  Procedure Laterality Date  . NO PAST SURGERIES      Obstetrical History: OB History    Gravida  2   Para  1   Term  1   Preterm      AB      Living  1     SAB      TAB      Ectopic      Multiple      Live Births              Social History: Social History   Socioeconomic History  . Marital status: Single    Spouse name: Not on file  . Number of children: Not on file  . Years of education: Not on file  . Highest education level: Not on file  Occupational History  . Not on file  Tobacco Use  . Smoking status: Former Smoker    Packs/day: 0.25    Types: Cigarettes  . Smokeless tobacco: Never Used  Substance and Sexual Activity  . Alcohol use: No  . Drug use: Not Currently  . Sexual activity: Yes    Birth control/protection: None  Other Topics Concern  . Not on file  Social History Narrative  . Not on file   Social Determinants of Health   Financial Resource Strain:   . Difficulty of Paying Living Expenses: Not on file  Food Insecurity:   . Worried  About 11/2018 in the Last Year: Not on file  . Ran Out of Food in the Last Year: Not on file  Transportation Needs:   . Lack of Transportation (Medical): Not on file  . Lack of Transportation (Non-Medical): Not on file  Physical Activity:   . Days of Exercise per Week: Not on file  . Minutes of Exercise per Session: Not on file  Stress:   . Feeling of Stress : Not on file  Social Connections:   . Frequency of Communication with Friends and Family: Not on file  . Frequency of Social Gatherings with Friends and Family: Not on file  . Attends Religious Services: Not on file  . Active Member of Clubs or Organizations: Not on file  . Attends Programme researcher, broadcasting/film/video Meetings: Not on file  . Marital Status: Not on file    Family History: History reviewed. No pertinent family history.  Allergies: No Known Allergies  Medications Prior to Admission  Medication Sig Dispense Refill Last Dose  . acyclovir (ZOVIRAX) 400 MG tablet Take 1 tablet (400 mg total) by mouth 3 (three) times daily. 21 tablet 0 Past Week at  Unknown time  . butalbital-acetaminophen-caffeine (FIORICET) 50-325-40 MG tablet Take by mouth 2 (two) times daily as needed for headache.   02/06/2019 at Unknown time  . ondansetron (ZOFRAN ODT) 4 MG disintegrating tablet Take 1-2 tablets (4-8 mg total) by mouth every 6 (six) hours as needed for nausea or vomiting. 20 tablet 0 02/07/2019 at Unknown time  . Prenatal Vit-Fe Fumarate-FA (PRENATAL COMPLETE) 14-0.4 MG TABS Take 1 tablet by mouth daily. 60 each 0 02/07/2019 at Unknown time     Review of Systems   All systems reviewed and negative except as stated in HPI  Blood pressure 126/73, pulse 93, temperature 97.9 F (36.6 C), temperature source Oral, resp. rate 18, height 5\' 5"  (1.651 m), last menstrual period 03/26/2018, SpO2 98 %. General appearance: alert, cooperative and appears stated age Lungs: regular rate and effort Heart: regular rate  Abdomen: soft,  non-tender Extremities: Homans sign is negative, no sign of DVT Presentation: cephalic Fetal monitoringBaseline: 135 bpm, Variability: Good {> 6 bpm), Accelerations: Reactive and Decelerations: Absent Uterine activity Minimal on Toco Dilation: 2.5 Effacement (%): 50 Station: -3 Exam by:: Pollie Friar, MD resident  Prenatal labs: ABO, Rh: --/--/O POS (12/17 1238) Antibody: NEG (12/17 1238)Negative Rubella:  Positive RPR:   Non Reactive HBsAg:   Non Reactive HIV: Non Reactive (05/04 0936)  GBS:   Negative 01/23/2019 2 hr GTT not required 1 hr GTT 105  Prenatal Transfer Tool  Maternal Diabetes: No Genetic Screening: Normal Maternal Ultrasounds/Referrals: Normal Fetal Ultrasounds or other Referrals:  None Maternal Substance Abuse:  No Significant Maternal Medications:  None Significant Maternal Lab Results: Group B Strep negative  Results for orders placed or performed during the hospital encounter of 02/07/19 (from the past 24 hour(s))  POCT fern test   Collection Time: 02/07/19 12:25 PM  Result Value Ref Range   POCT Fern Test Positive = ruptured amniotic membanes   Type and screen [place]   Collection Time: 02/07/19 12:38 PM  Result Value Ref Range   ABO/RH(D) O POS    Antibody Screen NEG    Sample Expiration      02/10/2019,2359 Performed at [facility], [address] [place], [place] [address]   CBC   Collection Time: 02/07/19 12:41 PM  Result Value Ref Range   WBC 10.4 4.0 - 10.5 K/uL   RBC 4.27 3.87 - 5.11 MIL/uL   Hemoglobin 10.1 (L) 12.0 - 15.0 g/dL   HCT 31.0 (L) 36.0 - 46.0 %   MCV 72.6 (L) 80.0 - 100.0 fL   MCH 23.7 (L) 26.0 - 34.0 pg   MCHC 32.6 30.0 - 36.0 g/dL   RDW 15.9 (H) 11.5 - 15.5 %   Platelets 301 150 - 400 K/uL   nRBC 0.0 0.0 - 0.2 %    Patient Active Problem List   Diagnosis Date Noted  . Normal labor 02/07/2019  . PID (acute pelvic inflammatory disease) 02/23/2016    Assessment: Joyice Magda is a 23  y.o. G2P1001 at [redacted]w[redacted]d here for SROM at 1100a, today.  1. Labor: FB in at 1450, give Miso now.  Consider AROM, Pitocin as appropriate 2. FWB: Cat I 3. Pain: Epidural, IV meds at patients request 4. GBS: Negative   Plan: Admit L&D FHM Diet as tolerated Anticipate Vaginal Delivery  Carollee Leitz, MD  02/07/2019, 2:56 PM   OB FELLOW ATTESTATION  I have seen and examined this patient and agree with above documentation in the resident's note except as  noted below.  23 y/o G2P1 at 1459w2d here after SROM, [facility] but did not want to go there for delivery. 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 Prenatal records reviewed in Care Everywhere, GBS neg, no other remarkable labs. Would like post placental IUD, counseled, consented, and ordered for Liletta IUD.   COVID test still pending.   Zack SealMateo Trashawn Oquendo, MD/[facility] OB Fellow  02/07/2019, 5:06 PM

## 2019-02-07 NOTE — Progress Notes (Signed)
Alexis [name] is a 23 y.o. G2P1001 at [redacted]w[redacted]d admitted for rupture of membranes  Subjective: Feeling fetal movement and having some contractions but not uncomfortable.  Objective: BP (!) 111/57   Pulse 80   Temp 97.9 F (36.6 C) (Oral)   Resp 18   Ht 5\' 5"  (1.651 m)   LMP 03/26/2018 (Approximate)   SpO2 98%   BMI 32.45 kg/m  No intake/output data recorded.  FHT:  FHR: 135-140 bpm, variability: moderate,  accelerations:  Present,  decelerations:  Absent UC:   Moderate on Toco  SVE:   Dilation: 4 Effacement (%): 50 Station: -2 Exam by:: Dr. Volanda Napoleon  Labs: Lab Results  Component Value Date   WBC 10.4 02/07/2019   HGB 10.1 (L) 02/07/2019   HCT 31.0 (L) 02/07/2019   MCV 72.6 (L) 02/07/2019   PLT 301 02/07/2019    Assessment / Plan: Alexis [name] is a 23 y.o. G2P1001 at [redacted]w[redacted]d here for SROM   Labor: s/p Cytotec x1, FB out at 1815. Start Pitocin. AROM as appropriate Fetal Wellbeing:  Category I Pain Control:  Epidural and IV pain meds at patients request I/D:  GBS negative Anticipated MOD:  Vaginal Delivery, CS as appropriate  Carollee Leitz MD PGY1 Family Med Practice 02/07/2019, 6:57 PM

## 2019-02-07 NOTE — MAU Note (Signed)
Pt presents to MAU with c/o PROM. Pt felt gush of clear fluid around 1050 today, denies bleeding. She started having ctx shortly after the gush of fluid. +FM

## 2019-02-08 ENCOUNTER — Inpatient Hospital Stay (HOSPITAL_COMMUNITY): Payer: Medicaid Other | Admitting: Anesthesiology

## 2019-02-08 ENCOUNTER — Encounter (HOSPITAL_COMMUNITY): Payer: Self-pay | Admitting: Family Medicine

## 2019-02-08 DIAGNOSIS — Z30430 Encounter for insertion of intrauterine contraceptive device: Secondary | ICD-10-CM

## 2019-02-08 DIAGNOSIS — O326XX0 Maternal care for compound presentation, not applicable or unspecified: Secondary | ICD-10-CM

## 2019-02-08 LAB — RPR: RPR Ser Ql: NONREACTIVE

## 2019-02-08 MED ORDER — DIPHENHYDRAMINE HCL 25 MG PO CAPS: 25.0000 mg | ORAL_CAPSULE | Freq: Four times a day (QID) | ORAL | Status: DC | PRN

## 2019-02-08 MED ORDER — PRENATAL MULTIVITAMIN CH
1.0000 | ORAL_TABLET | Freq: Every day | ORAL | Status: DC
  Administered 2019-02-08 – 2019-02-09 (×2): 1 via ORAL
  Filled 2019-02-08 (×2): qty 1

## 2019-02-08 MED ORDER — ACETAMINOPHEN 325 MG PO TABS
650.0000 mg | ORAL_TABLET | ORAL | Status: DC | PRN
  Administered 2019-02-08: 15:00:00 650 mg via ORAL
  Filled 2019-02-08: qty 2

## 2019-02-08 MED ORDER — TETANUS-DIPHTH-ACELL PERTUSSIS 5-2.5-18.5 LF-MCG/0.5 IM SUSP: 0.5000 mL | Freq: Once | INTRAMUSCULAR | Status: DC

## 2019-02-08 MED ORDER — ONDANSETRON HCL 4 MG PO TABS: 4.0000 mg | ORAL_TABLET | ORAL | Status: DC | PRN

## 2019-02-08 MED ORDER — SENNOSIDES-DOCUSATE SODIUM 8.6-50 MG PO TABS
2.0000 | ORAL_TABLET | ORAL | Status: DC
  Administered 2019-02-08: 23:00:00 2 via ORAL
  Filled 2019-02-08: qty 2

## 2019-02-08 MED ORDER — COCONUT OIL OIL: 1.0000 "application " | TOPICAL_OIL | Status: DC | PRN

## 2019-02-08 MED ORDER — LIDOCAINE-EPINEPHRINE 1 %-1:100000 IJ SOLN
INTRAMUSCULAR | Status: DC | PRN
  Administered 2019-02-08: 6 mg

## 2019-02-08 MED ORDER — ONDANSETRON HCL 4 MG/2ML IJ SOLN: 4.0000 mg | INTRAMUSCULAR | Status: DC | PRN

## 2019-02-08 MED ORDER — WITCH HAZEL-GLYCERIN EX PADS: 1.0000 "application " | MEDICATED_PAD | CUTANEOUS | Status: DC | PRN

## 2019-02-08 MED ORDER — BENZOCAINE-MENTHOL 20-0.5 % EX AERO: 1.0000 "application " | INHALATION_SPRAY | CUTANEOUS | Status: DC | PRN

## 2019-02-08 MED ORDER — SODIUM CHLORIDE (PF) 0.9 % IJ SOLN
INTRAMUSCULAR | Status: DC | PRN
  Administered 2019-02-08: 12 mL/h via EPIDURAL

## 2019-02-08 MED ORDER — DIBUCAINE (PERIANAL) 1 % EX OINT: 1.0000 "application " | TOPICAL_OINTMENT | CUTANEOUS | Status: DC | PRN

## 2019-02-08 MED ORDER — IBUPROFEN 600 MG PO TABS
600.0000 mg | ORAL_TABLET | Freq: Four times a day (QID) | ORAL | Status: DC
  Administered 2019-02-08 – 2019-02-09 (×5): 600 mg via ORAL
  Filled 2019-02-08 (×5): qty 1

## 2019-02-08 MED ORDER — ZOLPIDEM TARTRATE 5 MG PO TABS: 5.0000 mg | ORAL_TABLET | Freq: Every evening | ORAL | Status: DC | PRN

## 2019-02-08 MED ORDER — SIMETHICONE 80 MG PO CHEW: 80.0000 mg | CHEWABLE_TABLET | ORAL | Status: DC | PRN

## 2019-02-08 NOTE — Discharge Summary (Signed)
Postpartum Discharge Summary     Patient Name: Alexis [name] DOB: 1995-08-20 MRN: [id number]  Date of admission: 02/07/2019 Delivering Provider: Christin Fudge   Date of discharge: 02/09/2019  Admitting diagnosis: Normal labor [O80, Z37.9] Intrauterine pregnancy: [redacted]w[redacted]d    Secondary diagnosis:  Active Problems:   Normal labor  Additional problems: none     Discharge diagnosis: Term Pregnancy Delivered                                                                                                Post partum procedures: Liletta IUD  Augmentation: Pitocin  Complications: None  Hospital course:  Onset of Labor With Vaginal Delivery     23y.o. yo G2P1001 at 366w3das admitted in Latent Labor on 02/07/2019. Patient had an uncomplicated labor course as follows:  Membrane Rupture Time/Date:  ,   Intrapartum Procedures: Episiotomy: None [1]                                         Lacerations:  Periurethral [8]  Patient had a delivery of a Viable infant. 02/08/2019  Information for the patient's newborn:  McAyliana, Casciano0[[id number]]Delivery Method: Vaginal, Spontaneous(Filed from Delivery Summary)     Pateint had an uncomplicated postpartum course.  She is ambulating, tolerating a regular diet, passing flatus, had a bowel movement this morning and urinating well. Patient is discharged home in stable condition on 02/09/19.  Delivery time: 4:56 AM    Magnesium Sulfate received: No BMZ received: No Rhophylac:N/A MMR:No Transfusion:No  Physical exam  Vitals:   02/08/19 1436 02/08/19 1753 02/08/19 2248 02/09/19 0539  BP: 120/81 103/69 114/62 109/66  Pulse: 69 72 70 74  Resp: '20 18 19 19  ' Temp: 98.6 F (37 C) 98.5 F (36.9 C) 98.6 F (37 C) 98.1 F (36.7 C)  TempSrc: Oral Oral Oral Oral  SpO2:  100% 99% 99%  Height:       General: alert, cooperative and no distress Lochia: appropriate Uterine Fundus: firm Incision: N/A DVT Evaluation: No  evidence of DVT seen on physical exam. Negative Homan's sign. No cords or calf tenderness. No significant calf/ankle edema. Labs: Lab Results  Component Value Date   WBC 9.4 02/09/2019   HGB 9.0 (L) 02/09/2019   HCT 28.7 (L) 02/09/2019   MCV 74.9 (L) 02/09/2019   PLT 252 02/09/2019   CMP Latest Ref Rng & Units 06/25/2018  Glucose 70 - 99 mg/dL 89  BUN 6 - 20 mg/dL 5(L)  Creatinine 0.44 - 1.00 mg/dL 0.64  Sodium 135 - 145 mmol/L 137  Potassium 3.5 - 5.1 mmol/L 3.9  Chloride 98 - 111 mmol/L 106  CO2 22 - 32 mmol/L 23  Calcium 8.9 - 10.3 mg/dL 9.0  Total Protein 6.5 - 8.1 g/dL 6.6  Total Bilirubin 0.3 - 1.2 mg/dL 0.8  Alkaline Phos 38 - 126 U/L 44  AST 15 - 41 U/L 20  ALT 0 - 44 U/L 17    Discharge  instruction: per After Visit Summary and "Baby and Me Booklet".  After visit meds:  Allergies as of 02/09/2019   No Known Allergies     Medication List    TAKE these medications   acetaminophen 325 MG tablet Commonly known as: Tylenol Take 2 tablets (650 mg total) by mouth every 6 (six) hours as needed.   acyclovir 400 MG tablet Commonly known as: ZOVIRAX Take 1 tablet (400 mg total) by mouth 3 (three) times daily.   butalbital-acetaminophen-caffeine 50-325-40 MG tablet Commonly known as: FIORICET Take by mouth 2 (two) times daily as needed for headache.   ferrous sulfate 325 (65 FE) MG tablet Take 1 tablet (325 mg total) by mouth daily.   ibuprofen 600 MG tablet Commonly known as: ADVIL Take 1 tablet (600 mg total) by mouth every 6 (six) hours as needed.   ondansetron 4 MG disintegrating tablet Commonly known as: Zofran ODT Take 1-2 tablets (4-8 mg total) by mouth every 6 (six) hours as needed for nausea or vomiting.   Prenatal Complete 14-0.4 MG Tabs Take 1 tablet by mouth daily.   senna 8.6 MG Tabs tablet Commonly known as: SENOKOT Take 1 tablet (8.6 mg total) by mouth daily as needed.      Diet: routine diet  Activity: Advance as tolerated. Pelvic  rest for 6 weeks.   Outpatient follow up:4 weeks Follow up Appt:No future appointments. Follow up Visit:   Please schedule this patient for Postpartum visit in: 4 weeks with the following provider: Any provider For C/S patients schedule nurse incision check in weeks 2 weeks: no Low risk pregnancy complicated by: Delivery mode:  SVD Anticipated Birth Control:  IUD placed PP Procedures needed: Schedule Integrated [place] visit: no      Newborn Data: Live born female  Birth Weight:   APGAR: ,   Newborn Delivery   Birth date/time: 02/08/2019 04:56:00 Delivery type: Vaginal, Spontaneous      Baby Feeding: Bottle and Breast Disposition:home with mother   02/09/2019 Clarisa Fling, NP

## 2019-02-08 NOTE — Procedures (Signed)
Post-Placental IUD Insertion Procedure Note  Patient identified, informed consent signed prior to delivery, signed copy in chart, time out was performed.    Vaginal, labial and perineal areas thoroughly inspected for lacerations. 1st degree laceration identified - not hemostatic,repaired /   Liletta  IUD inserted with inserter per manufacturer's instructions.    Strings trimmed to the level of the introitus. Patient tolerated procedure well.   Patient given post procedure instructions and IUD care card with expiration date.  Patient is asked to keep IUD strings tucked in her vagina until her postpartum follow up visit in 4-6 weeks. Patient advised to abstain from sexual intercourse and pulling on strings before her follow-up visit. Patient verbalized an understanding of the plan of care and agrees.

## 2019-02-08 NOTE — Progress Notes (Signed)
Vitals:   02/08/19 0036 02/08/19 0041  BP: 109/72 113/71  Pulse: 76 70  Resp:  17  Temp:  98.2 F (36.8 C)  SpO2:     Comfortable w/epidural. FHR Cat 1.  CTX q 2-3 minutes. Pitocin at 10 mu/min.  Cx 5/50/-2.  Continue present mgt.

## 2019-02-08 NOTE — Anesthesia Preprocedure Evaluation (Signed)
Anesthesia Evaluation  Patient identified by MRN, date of birth, ID band Patient awake    Reviewed: Allergy & Precautions, H&P , NPO status , Patient's Chart, lab work & pertinent test results, reviewed documented beta blocker date and time   Airway Mallampati: I  TM Distance: >3 FB Neck ROM: full    Dental no notable dental hx.    Pulmonary neg pulmonary ROS, former smoker,    Pulmonary exam normal breath sounds clear to auscultation       Cardiovascular + CAD  negative cardio ROS Normal cardiovascular exam Rhythm:regular Rate:Normal     Neuro/Psych negative neurological ROS  negative psych ROS   GI/Hepatic negative GI ROS, Neg liver ROS,   Endo/Other  negative endocrine ROS  Renal/GU negative Renal ROS  negative genitourinary   Musculoskeletal   Abdominal (+) + obese,   Peds  Hematology negative hematology ROS (+) Blood dyscrasia, anemia ,   Anesthesia Other Findings   Reproductive/Obstetrics (+) Pregnancy                             Anesthesia Physical Anesthesia Plan  ASA: III  Anesthesia Plan: Epidural   Post-op Pain Management:    Induction:   PONV Risk Score and Plan:   Airway Management Planned:   Additional Equipment:   Intra-op Plan:   Post-operative Plan:   Informed Consent: I have reviewed the patients History and Physical, chart, labs and discussed the procedure including the risks, benefits and alternatives for the proposed anesthesia with the patient or authorized representative who has indicated his/her understanding and acceptance.     Dental Advisory Given  Plan Discussed with:   Anesthesia Plan Comments: (Labs checked- platelets confirmed with RN in room. Fetal heart tracing, per RN, reported to be stable enough for sitting procedure. Discussed epidural, and patient consents to the procedure:  included risk of possible headache,backache, failed  block, allergic reaction, and nerve injury. This patient was asked if she had any questions or concerns before the procedure started.)        Anesthesia Quick Evaluation

## 2019-02-08 NOTE — Lactation Note (Signed)
This note was copied from a baby's chart. Lactation Consultation Note  Patient Name: Alexis [name] [id number]'[id number] Date: 02/08/2019 Reason for consult: Initial assessment;Early term 37-38.6wks;1st time breastfeeding;Infant < 6lbs P2.  Mom did not breastfeed her first baby.  Newborn is 9 hours old and latching easily per mom.  She has also been supplemented once with 22 calorie formula.  Instructed to put baby to breast with any feeding cues.  Encouraged to call for feeding assist.  Symphony pump initiated for stimulation.  Instructed on use and cleaning.  Instructed to post pump x 15 minutes every 3 hours and give any colostrum to baby.  Mom denies questions.  Breastfeeding consultation services information given and reviewed.  Maternal Data Does the patient have breastfeeding experience prior to this delivery?: No  Feeding Feeding Type: Formula Nipple Type: Slow - flow  LATCH Score                   Interventions Interventions: DEBP  Lactation Tools Discussed/Used Pump Review: Setup, frequency, and cleaning;Milk Storage Initiated by:: [email or web address] Date initiated:: 02/08/19   Consult Status Consult Status: Follow-up Date: 02/09/19 Follow-up type: In-patient    Ave Filter 02/08/2019, 1:32 PM

## 2019-02-08 NOTE — Progress Notes (Signed)
.  Labor Progress Note Alexis [name] is a 23 y.o. G2P1001 at [redacted]w[redacted]d presented for SROM S: Feeling some pelvic pressure, pain well controlled w/ epidural. CTX q2-3 min, tracing poorly.  O:  BP 111/72   Pulse 94   Temp 97.8 F (36.6 C) (Oral)   Resp 16   Ht 5\' 5"  (1.651 m)   LMP 03/26/2018 (Approximate)   SpO2 100%   BMI 32.45 kg/m  EFM: 145/mod var/+ accels, variables  CVE: Dilation: Lip/rim Dilation Complete Date: 02/08/19 Dilation Complete Time: 0310 Effacement (%): 50 Station: 0 Presentation: Vertex Exam by:: [place] Resident   A&P: 23 y.o. G2P1001 [redacted]w[redacted]d SROM #Labor: Progressing well. S/p cytotec, FB. On Pit 18, uptitrate as needed. Ruptured forebag w/ clear fluid and head applied well. Anticipate vaginal delivery. #Pain: epidural #FWB: Cat 2 FHT, cont to monitor #GBS negative   Demetrius Revel, MD 3:47 AM

## 2019-02-08 NOTE — Anesthesia Postprocedure Evaluation (Signed)
Anesthesia Post Note  Patient: [name]  Procedure(s) Performed: AN AD [place]     Patient location during evaluation: Mother Baby Anesthesia Type: Epidural Level of consciousness: awake, awake and alert and oriented Pain management: pain level controlled Vital Signs Assessment: post-procedure vital signs reviewed and stable Respiratory status: spontaneous breathing and respiratory function stable Cardiovascular status: blood pressure returned to baseline Postop Assessment: no headache, epidural receding, patient able to bend at knees, adequate PO intake, no backache, no apparent nausea or vomiting and able to ambulate Anesthetic complications: no    Last Vitals:  Vitals:   02/08/19 0745 02/08/19 0915  BP: 107/70   Pulse: 69 61  Resp: 17 18  Temp: 36.6 C 36.8 C  SpO2: 100%     Last Pain:  Vitals:   02/08/19 1137  TempSrc:   PainSc: 5    Pain Goal:                   Alexis [name]

## 2019-02-08 NOTE — Anesthesia Procedure Notes (Signed)
Epidural Patient location during procedure: OB Start time: 02/08/2019 12:29 AM End time: 02/08/2019 12:35 AM  Staffing Anesthesiologist: Janeece Riggers, MD  Preanesthetic Checklist Completed: patient identified, IV checked, site marked, risks and benefits discussed, surgical consent, monitors and equipment checked, pre-op evaluation and timeout performed  Epidural Patient position: sitting Prep: DuraPrep and site prepped and draped Patient monitoring: continuous pulse ox and blood pressure Approach: midline Location: L3-L4 Injection technique: LOR air  Needle:  Needle type: Tuohy  Needle gauge: 17 G Needle length: 9 cm and 9 Needle insertion depth: 8 cm Catheter type: closed end flexible Catheter size: 19 Gauge Catheter at skin depth: 13 cm Test dose: negative  Assessment Events: blood not aspirated, injection not painful, no injection resistance, no paresthesia and negative IV test

## 2019-02-08 NOTE — Clinical Social Work Maternal (Signed)
CLINICAL SOCIAL WORK MATERNAL/CHILD NOTE  Patient Details  Name: Alexis [name] MRN: [id number] Date of Birth: 02/08/1996  Date:  02/08/2019  Clinical Social Worker Initiating Note:  Lynisha Osuch Date/Time: Initiated:  02/08/19/1348     Child's Name:  Alexis [name]   Biological Parents:  Mother, Father(Alexis [name] and Alexis [name] DOB: 04/17/1986)   Need for Interpreter:  None   Reason for Referral:  Behavioral Health Concerns   Address:  [address] [place] [address]    Phone number:  [phone] (home)     Additional phone number:   Household Members/Support Persons (HM/SP):   Household Member/Support Person 1, Household Member/Support Person 2, Household Member/Support Person 3   HM/SP Name Relationship DOB or Age  HM/SP -1 Mykael Iglehart Son 10/27/2009  HM/SP -2   Sister    HM/SP -3   Nephew    HM/SP -4        HM/SP -5        HM/SP -6        HM/SP -7        HM/SP -8          Natural Supports (not living in the home):  Extended Family, Immediate Family, Parent   Professional Supports: None   Employment: Unemployed   Type of Work:     Education:  High school graduate   Homebound arranged:    Financial Resources:  Medicaid   Other Resources:  Food Stamps , WIC   Cultural/Religious Considerations Which May Impact Care:    Strengths:  Ability to meet basic needs , Home prepared for child , Pediatrician chosen   Psychotropic Medications:         Pediatrician:    [place] area  Pediatrician List:   [facility]  [place]    [place]    [place]     [place]    [place]      Pediatrician Fax Number:    Risk Factors/Current Problems:  Mental Health Concerns    Cognitive State:  Able to Concentrate , Alert , Linear Thinking    Mood/Affect:  Calm , Comfortable , Bright , Happy , Interested , Relaxed    CSW Assessment:  CSW received consult for  history of bipolar disorder. CSW met with MOB to offer support and complete assessment.    MOB sitting up in bed with infant asleep in bassinet, when CSW entered the room. CSW introduced self and explained reason for consult to which MOB expressed understanding. Per MOB, she currently lives with her sister, her nephew and her 25-year-old son. MOB confirmed she receives both WIC and food stamps and intends to follow up with them both on Monday. CSW inquired about MOB's mental health history and MOB acknowledged a history of bipolar and depression. MOB shared she hasn't felt depressed since before March and attributed those symptoms to situational stressors occurring at the time. MOB denied any symptoms during her pregnancy and reported feeling good now. MOB acknowledged taking Seroquel prior to finding out she was pregnant but stated it only helped her sleep and made her feel like a zombie. At this time, MOB not interested in restarting medications and would like to see how things go. MOB denied previous history of PMADs during previous pregnancy. CSW provided education regarding the baby blues period vs. perinatal mood disorders. CSW recommended self-evaluation during the postpartum time period using the New Mom Checklist from Postpartum Progress and encouraged MOB to contact   a medical professional if symptoms are noted at any time. MOB did not appear to be displaying any acute mental health symptoms and denied any current SI, HI or DV. MOB reported feeling well supported by FOB, her mom, her 3 sisters, and her mom's ex.   MOB confirmed having all essential items for infant once discharged and stated infant would be sleeping in a bassinet once home. CSW provided review of Sudden Infant Death Syndrome (SIDS) precautions and safe sleeping habits.    CSW Plan/Description:  No Further Intervention Required/No Barriers to Discharge, Sudden Infant Death Syndrome (SIDS) Education, Perinatal Mood and Anxiety Disorder  (PMADs) Education    Solyana Nonaka, LCSW 02/08/2019, 2:14 PM 

## 2019-02-09 LAB — CBC
HCT: 28.7 % — ABNORMAL LOW (ref 36.0–46.0)
Hemoglobin: 9 g/dL — ABNORMAL LOW (ref 12.0–15.0)
MCH: 23.5 pg — ABNORMAL LOW (ref 26.0–34.0)
MCHC: 31.4 g/dL (ref 30.0–36.0)
MCV: 74.9 fL — ABNORMAL LOW (ref 80.0–100.0)
Platelets: 252 10*3/uL (ref 150–400)
RBC: 3.83 MIL/uL — ABNORMAL LOW (ref 3.87–5.11)
RDW: 16 % — ABNORMAL HIGH (ref 11.5–15.5)
WBC: 9.4 10*3/uL (ref 4.0–10.5)
nRBC: 0 % (ref 0.0–0.2)

## 2019-02-09 MED ORDER — IBUPROFEN 600 MG PO TABS: 600.0000 mg | ORAL_TABLET | Freq: Four times a day (QID) | ORAL | 0 refills | Status: DC | PRN

## 2019-02-09 MED ORDER — FERROUS SULFATE 325 (65 FE) MG PO TABS
325.0000 mg | ORAL_TABLET | Freq: Every day | ORAL | Status: DC
  Administered 2019-02-09: 10:00:00 325 mg via ORAL
  Filled 2019-02-09: qty 1

## 2019-02-09 MED ORDER — SENNA 8.6 MG PO TABS: 1.0000 | ORAL_TABLET | Freq: Every day | ORAL | 0 refills | Status: DC | PRN

## 2019-02-09 MED ORDER — ACETAMINOPHEN 325 MG PO TABS: 650.0000 mg | ORAL_TABLET | Freq: Four times a day (QID) | ORAL | 0 refills | Status: DC | PRN

## 2019-02-09 MED ORDER — FERROUS SULFATE 325 (65 FE) MG PO TABS: 325.0000 mg | ORAL_TABLET | Freq: Every day | ORAL | 1 refills | Status: DC

## 2019-02-09 NOTE — Discharge Instructions (Signed)

## 2019-02-09 NOTE — Progress Notes (Addendum)
POSTPARTUM PROGRESS NOTE  Subjective: Alexis [name] is a 23 y.o. F1Q1975 s/p Vaginal Delivery at [redacted]w[redacted]d.  She reports she doing well. No acute events overnight. She denies any problems with ambulating, voiding or po intake. Denies nausea or vomiting. She has  passed flatus. Pain is moderately controlled.  Lochia is appropriate.  Objective: Blood pressure 109/66, pulse 74, temperature 98.1 F (36.7 C), temperature source Oral, resp. rate 19, height 5\' 5"  (1.651 m), last menstrual period 03/26/2018, SpO2 99 %, unknown if currently breastfeeding.  Physical Exam:  General: alert, cooperative and no distress Chest: no respiratory distress Abdomen: soft, tenderness on palpation Uterine Fundus: firm, appropriately tender Extremities: No calf swelling or tenderness  no edema  Recent Labs    02/07/19 1241 02/09/19 0512  HGB 10.1* 9.0*  HCT 31.0* 28.7*    Assessment/Plan: Alexis [name] is a 23 y.o. [id number] s/p Vaginal Delivery at [redacted]w[redacted]d for .  Routine Postpartum Care: Doing well, pain well-controlled.  -- Continue routine care, lactation support  -- Contraception: Depo -- Feeding: Breast  Dispo: Plan for discharge later today or tomorrow. Carollee Leitz MD [place] for [facility]   I confirm that I have verified the information documented in the resident's note and that I have also personally reperformed the history, physical exam and all medical decision making activities of this service and have verified that all service and findings are accurately documented in this student's note.   Clarisa Fling, NP 02/09/2019 11:57 AM

## 2019-02-12 LAB — BIRTH TISSUE RECOVERY COLLECTION (PLACENTA DONATION)

## 2019-02-19 ENCOUNTER — Telehealth: Payer: Self-pay | Admitting: Family Medicine

## 2019-02-19 NOTE — Telephone Encounter (Signed)
Attempted to call patient to get her scheduled for her postpartum visit. No answer, left voicemail for patient to give the office a call back to be scheduled.

## 2019-02-25 ENCOUNTER — Ambulatory Visit (HOSPITAL_COMMUNITY)
Admission: EM | Admit: 2019-02-25 | Discharge: 2019-02-25 | Disposition: A | Discharge status: Home / Self Care | Payer: Medicaid Other

## 2019-02-25 ENCOUNTER — Other Ambulatory Visit: Payer: Self-pay

## 2019-03-21 ENCOUNTER — Ambulatory Visit: Payer: Medicaid Other | Admitting: Obstetrics and Gynecology

## 2019-09-28 ENCOUNTER — Telehealth: Payer: Medicaid Other | Admitting: Nurse Practitioner

## 2019-09-28 DIAGNOSIS — R519 Headache, unspecified: Secondary | ICD-10-CM | POA: Diagnosis not present

## 2019-09-28 DIAGNOSIS — J029 Acute pharyngitis, unspecified: Secondary | ICD-10-CM

## 2019-09-28 DIAGNOSIS — R05 Cough: Secondary | ICD-10-CM | POA: Diagnosis not present

## 2019-09-28 DIAGNOSIS — R059 Cough, unspecified: Secondary | ICD-10-CM

## 2019-09-28 MED ORDER — BENZONATATE 100 MG PO CAPS: 100.0000 mg | ORAL_CAPSULE | Freq: Three times a day (TID) | ORAL | 0 refills | Status: DC | PRN

## 2019-09-28 NOTE — Addendum Note (Signed)
Addended by: Bennie Pierini on: 09/28/2019 03:17 PM   Modules accepted: Orders

## 2019-09-28 NOTE — Progress Notes (Signed)

## 2019-09-29 ENCOUNTER — Telehealth: Payer: Medicaid Other | Admitting: Family

## 2019-09-29 DIAGNOSIS — R05 Cough: Secondary | ICD-10-CM

## 2019-09-29 DIAGNOSIS — R059 Cough, unspecified: Secondary | ICD-10-CM

## 2019-09-29 MED ORDER — PREDNISONE 10 MG (21) PO TBPK: ORAL_TABLET | ORAL | 0 refills | Status: DC

## 2019-09-29 MED ORDER — BENZONATATE 100 MG PO CAPS: 100.0000 mg | ORAL_CAPSULE | Freq: Three times a day (TID) | ORAL | 0 refills | Status: DC | PRN

## 2019-09-29 NOTE — Progress Notes (Signed)
We are sorry that you are not feeling well.  Here is how we plan to help!  Based on your presentation I believe you most likely have A cough due to a virus.  This is called viral bronchitis and is best treated by rest, plenty of fluids and control of the cough.  You may use Ibuprofen or Tylenol as directed to help your symptoms.   You also need to be tested for COVID to rule this out.    In addition you may use A non-prescription cough medication called Mucinex DM: take 2 tablets every 12 hours. and A prescription cough medication called Tessalon Perles 100mg . You may take 1-2 capsules every 8 hours as needed for your cough.  Prednisone 10 mg daily for 6 days (see taper instructions below)  Directions for 6 day taper: Day 1: 2 tablets before breakfast, 1 after both lunch & dinner and 2 at bedtime Day 2: 1 tab before breakfast, 1 after both lunch & dinner and 2 at bedtime Day 3: 1 tab at each meal & 1 at bedtime Day 4: 1 tab at breakfast, 1 at lunch, 1 at bedtime Day 5: 1 tab at breakfast & 1 tab at bedtime Day 6: 1 tab at breakfast   From your responses in the eVisit questionnaire you describe inflammation in the upper respiratory tract which is causing a significant cough.  This is commonly called Bronchitis and has four common causes:    Allergies  Viral Infections  Acid Reflux  Bacterial Infection Allergies, viruses and acid reflux are treated by controlling symptoms or eliminating the cause. An example might be a cough caused by taking certain blood pressure medications. You stop the cough by changing the medication. Another example might be a cough caused by acid reflux. Controlling the reflux helps control the cough.  USE OF BRONCHODILATOR ("RESCUE") INHALERS: There is a risk from using your bronchodilator too frequently.  The risk is that over-reliance on a medication which only relaxes the muscles surrounding the breathing tubes can reduce the effectiveness of medications  prescribed to reduce swelling and congestion of the tubes themselves.  Although you feel brief relief from the bronchodilator inhaler, your asthma may actually be worsening with the tubes becoming more swollen and filled with mucus.  This can delay other crucial treatments, such as oral steroid medications. If you need to use a bronchodilator inhaler daily, several times per day, you should discuss this with your provider.  There are probably better treatments that could be used to keep your asthma under control.     HOME CARE . Only take medications as instructed by your medical team. . Complete the entire course of an antibiotic. . Drink plenty of fluids and get plenty of rest. . Avoid close contacts especially the very young and the elderly . Cover your mouth if you cough or cough into your sleeve. . Always remember to wash your hands . A steam or ultrasonic humidifier can help congestion.   GET HELP RIGHT AWAY IF: . You develop worsening fever. . You become short of breath . You cough up blood. . Your symptoms persist after you have completed your treatment plan MAKE SURE YOU   Understand these instructions.  Will watch your condition.  Will get help right away if you are not doing well or get worse.  Your e-visit answers were reviewed by a board certified advanced clinical practitioner to complete your personal care plan.  Depending on the condition, your plan could  have included both over the counter or prescription medications. If there is a problem please reply  once you have received a response from your provider. Your safety is important to [place].  If you have drug allergies check your prescription carefully.    You can use MyChart to ask questions about today's visit, request a non-urgent call back, or ask for a work or school excuse for 24 hours related to this e-Visit. If it has been greater than 24 hours you will need to follow up with your provider, or enter a new e-Visit to  address those concerns. You will get an e-mail in the next two days asking about your experience.  I hope that your e-visit has been valuable and will speed your recovery. Thank you for using e-visits.  Approximately 5 minutes was spent documenting and reviewing patient's chart.

## 2019-11-12 ENCOUNTER — Encounter (HOSPITAL_BASED_OUTPATIENT_CLINIC_OR_DEPARTMENT_OTHER): Payer: Medicaid Other | Admitting: Obstetrics & Gynecology

## 2019-11-28 ENCOUNTER — Encounter (HOSPITAL_COMMUNITY): Payer: Self-pay | Admitting: Emergency Medicine

## 2019-11-28 ENCOUNTER — Emergency Department (HOSPITAL_COMMUNITY): Payer: Medicaid Other

## 2019-11-28 ENCOUNTER — Other Ambulatory Visit: Payer: Self-pay

## 2019-11-28 ENCOUNTER — Emergency Department (HOSPITAL_COMMUNITY)
Admission: EM | Admit: 2019-11-28 | Discharge: 2019-11-28 | Disposition: A | Discharge status: Home / Self Care | Payer: Medicaid Other | Attending: Emergency Medicine | Admitting: Emergency Medicine

## 2019-11-28 DIAGNOSIS — R0781 Pleurodynia: Secondary | ICD-10-CM | POA: Insufficient documentation

## 2019-11-28 DIAGNOSIS — M545 Low back pain, unspecified: Secondary | ICD-10-CM | POA: Diagnosis not present

## 2019-11-28 DIAGNOSIS — Y93I9 Activity, other involving external motion: Secondary | ICD-10-CM | POA: Diagnosis not present

## 2019-11-28 DIAGNOSIS — Z87891 Personal history of nicotine dependence: Secondary | ICD-10-CM | POA: Diagnosis not present

## 2019-11-28 DIAGNOSIS — Y92410 Unspecified street and highway as the place of occurrence of the external cause: Secondary | ICD-10-CM | POA: Diagnosis not present

## 2019-11-28 NOTE — ED Notes (Addendum)
Patient transported to X-ray  1:00 PM Pt returned from x-ray at this time.
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
## 2019-11-28 NOTE — ED Notes (Signed)
Entered room and introduced self to patient at this time.
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
## 2020-01-09 ENCOUNTER — Encounter (HOSPITAL_COMMUNITY): Payer: Self-pay

## 2020-01-09 ENCOUNTER — Other Ambulatory Visit: Payer: Self-pay

## 2020-01-09 ENCOUNTER — Emergency Department (HOSPITAL_COMMUNITY): Payer: Medicaid Other

## 2020-01-09 ENCOUNTER — Emergency Department (HOSPITAL_COMMUNITY)
Admission: EM | Admit: 2020-01-09 | Discharge: 2020-01-09 | Disposition: A | Discharge status: Home / Self Care | Payer: Medicaid Other | Attending: Emergency Medicine | Admitting: Emergency Medicine

## 2020-01-09 DIAGNOSIS — F17210 Nicotine dependence, cigarettes, uncomplicated: Secondary | ICD-10-CM | POA: Insufficient documentation

## 2020-01-09 DIAGNOSIS — R569 Unspecified convulsions: Secondary | ICD-10-CM | POA: Insufficient documentation

## 2020-01-09 LAB — COMPREHENSIVE METABOLIC PANEL
ALT: 12 U/L (ref 0–44)
AST: 14 U/L — ABNORMAL LOW (ref 15–41)
Albumin: 3.9 g/dL (ref 3.5–5.0)
Alkaline Phosphatase: 46 U/L (ref 38–126)
Anion gap: 6 (ref 5–15)
BUN: 7 mg/dL (ref 6–20)
CO2: 23 mmol/L (ref 22–32)
Calcium: 8.7 mg/dL — ABNORMAL LOW (ref 8.9–10.3)
Chloride: 108 mmol/L (ref 98–111)
Creatinine, Ser: 0.61 mg/dL (ref 0.44–1.00)
GFR, Estimated: >60 mL/min (ref >60)
Glucose, Bld: 93 mg/dL (ref 70–99)
Potassium: 3.8 mmol/L (ref 3.5–5.1)
Sodium: 137 mmol/L (ref 135–145)
Total Bilirubin: 1 mg/dL (ref 0.3–1.2)
Total Protein: 7 g/dL (ref 6.5–8.1)

## 2020-01-09 LAB — URINALYSIS, ROUTINE W REFLEX MICROSCOPIC
Bilirubin Urine: NEGATIVE
Glucose, UA: NEGATIVE mg/dL
Hgb urine dipstick: NEGATIVE
Ketones, ur: NEGATIVE mg/dL
Nitrite: NEGATIVE
Protein, ur: NEGATIVE mg/dL
Specific Gravity, Urine: 1.018 (ref 1.005–1.030)
pH: 6 (ref 5.0–8.0)

## 2020-01-09 LAB — CBC WITH DIFFERENTIAL/PLATELET
Abs Immature Granulocytes: 0.01 10*3/uL (ref 0.00–0.07)
Basophils Absolute: 0 10*3/uL (ref 0.0–0.1)
Basophils Relative: 1 %
Eosinophils Absolute: 0.4 10*3/uL (ref 0.0–0.5)
Eosinophils Relative: 8 %
HCT: 38.2 % (ref 36.0–46.0)
Hemoglobin: 11.9 g/dL — ABNORMAL LOW (ref 12.0–15.0)
Immature Granulocytes: 0 %
Lymphocytes Relative: 37 %
Lymphs Abs: 1.9 10*3/uL (ref 0.7–4.0)
MCH: 22.5 pg — ABNORMAL LOW (ref 26.0–34.0)
MCHC: 31.2 g/dL (ref 30.0–36.0)
MCV: 72.1 fL — ABNORMAL LOW (ref 80.0–100.0)
Monocytes Absolute: 0.3 10*3/uL (ref 0.1–1.0)
Monocytes Relative: 6 %
Neutro Abs: 2.5 10*3/uL (ref 1.7–7.7)
Neutrophils Relative %: 48 %
Platelets: 306 10*3/uL (ref 150–400)
RBC: 5.3 MIL/uL — ABNORMAL HIGH (ref 3.87–5.11)
RDW: 16.2 % — ABNORMAL HIGH (ref 11.5–15.5)
WBC: 5.2 10*3/uL (ref 4.0–10.5)
nRBC: 0 % (ref 0.0–0.2)

## 2020-01-09 LAB — PREGNANCY, URINE: Preg Test, Ur: NEGATIVE

## 2020-01-09 LAB — CBG MONITORING, ED: Glucose-Capillary: 85 mg/dL (ref 70–99)

## 2020-01-09 LAB — ETHANOL: Alcohol, Ethyl (B): <10 mg/dL (ref <10)

## 2020-01-09 LAB — RAPID URINE DRUG SCREEN, HOSP PERFORMED
Amphetamines: NOT DETECTED
Barbiturates: NOT DETECTED
Benzodiazepines: NOT DETECTED
Cocaine: NOT DETECTED
Opiates: NOT DETECTED
Tetrahydrocannabinol: POSITIVE — AB

## 2020-01-09 NOTE — ED Triage Notes (Signed)
Pt arrived EMS stating pt had a seizure at home that the sister witnessed for approximately 3 minutes. Shaking,&  unresponsiveness noted. Pt arrived alert, verbal and tired.

## 2020-01-09 NOTE — Discharge Instructions (Addendum)
Do not do any driving until the neurologist says is okay.  Follow-up with Dr. Gerilyn Pilgrim in the next week.  Call tomorrow to set up an appointment and tell them you are in the emergency department and had a seizure and the doctor there wanted you to see Dr. Gerilyn Pilgrim next week.  Return if any more seizures or problems

## 2020-01-09 NOTE — ED Provider Notes (Signed)
[facility] EMERGENCY DEPARTMENT Provider Note   CSN: [id number] Arrival date & time: 01/09/20  1046     History Chief Complaint  Patient presents with  . Seizures    Alexis [name] is a 24 y.o. female.  Patient has seizures today witnessed by her sister.  No fever no chills no cough.  Patient was not postictal when she arrived but she was postictal after the seizure  The history is provided by the patient, a relative and medical records. No language interpreter was used.  Seizures Seizure activity on arrival: yes   Seizure type:  Grand mal Preceding symptoms: no sensation of an aura present   Initial focality:  None Episode characteristics: abnormal movements   Postictal symptoms: confusion   Return to baseline: no   Severity:  Moderate Timing:  Once Progression:  Resolved Context: not alcohol withdrawal        Past Medical History:  Diagnosis Date  . Anemia   . Chlamydia   . Shingles outbreak 10/2018  . Trichomonas infection     Patient Active Problem List   Diagnosis Date Noted  . Normal labor 02/07/2019  . PID (acute pelvic inflammatory disease) 02/23/2016    Past Surgical History:  Procedure Laterality Date  . NO PAST SURGERIES       OB History    Gravida  2   Para  2   Term  2   Preterm      AB      Living  2     SAB      TAB      Ectopic      Multiple  0   Live Births  1           No family history on file.  Social History   Tobacco Use  . Smoking status: Heavy Tobacco Smoker    Packs/day: 0.50    Types: Cigarettes  . Smokeless tobacco: Never Used  Vaping Use  . Vaping Use: Never used  Substance Use Topics  . Alcohol use: No  . Drug use: Not Currently    Home Medications Prior to Admission medications   Medication Sig Start Date End Date Taking? Authorizing Provider  acetaminophen (TYLENOL) 325 MG tablet Take 2 tablets (650 mg total) by mouth every 6 (six) hours as needed. 02/09/19  Yes Nugent, Odie Sera, NP   acyclovir (ZOVIRAX) 400 MG tablet Take 1 tablet (400 mg total) by mouth 3 (three) times daily. Patient not taking: Reported on 01/09/2020 12/25/18   Wallis Bamberg, PA-C  benzonatate (TESSALON PERLES) 100 MG capsule Take 1 capsule (100 mg total) by mouth 3 (three) times daily as needed. Patient not taking: Reported on 01/09/2020 09/29/19   Jannifer Rodney A, FNP  ferrous sulfate 325 (65 FE) MG tablet Take 1 tablet (325 mg total) by mouth daily. Patient not taking: Reported on 01/09/2020 02/09/19   Nugent, Odie Sera, NP  ibuprofen (ADVIL) 600 MG tablet Take 1 tablet (600 mg total) by mouth every 6 (six) hours as needed. Patient not taking: Reported on 01/09/2020 02/09/19   Nugent, Odie Sera, NP  ondansetron (ZOFRAN ODT) 4 MG disintegrating tablet Take 1-2 tablets (4-8 mg total) by mouth every 6 (six) hours as needed for nausea or vomiting. Patient not taking: Reported on 01/09/2020 09/29/18   Gerrit Heck, CNM  predniSONE (STERAPRED UNI-PAK 21 TAB) 10 MG (21) TBPK tablet Use as directed Patient not taking: Reported on 01/09/2020 09/29/19   Junie Spencer, FNP  Prenatal Vit-Fe Fumarate-FA (PRENATAL COMPLETE) 14-0.4 MG TABS Take 1 tablet by mouth daily. Patient not taking: Reported on 01/09/2020 06/18/18   Georgetta Haber, NP  senna (SENOKOT) 8.6 MG TABS tablet Take 1 tablet (8.6 mg total) by mouth daily as needed. Patient not taking: Reported on 01/09/2020 02/09/19   Nugent, Odie Sera, NP    Allergies    Patient has no known allergies.  Review of Systems   Review of Systems  Constitutional: Negative for appetite change and fatigue.  HENT: Negative for congestion, ear discharge and sinus pressure.   Eyes: Negative for discharge.  Respiratory: Negative for cough.   Cardiovascular: Negative for chest pain.  Gastrointestinal: Negative for abdominal pain and diarrhea.  Genitourinary: Negative for frequency and hematuria.  Musculoskeletal: Negative for back pain.  Skin: Negative for rash.    Neurological: Positive for seizures. Negative for headaches.  Psychiatric/Behavioral: Negative for hallucinations.    Physical Exam Updated Vital Signs BP 130/83   Pulse 73   Temp 98.6 F (37 C) (Oral)   Resp (!) 23   Ht 5\' 5"  (1.651 m)   Wt 61.2 kg   SpO2 100%   BMI 22.47 kg/m   Physical Exam Vitals and nursing note reviewed.  Constitutional:      Appearance: She is well-developed.  HENT:     Head: Normocephalic.     Nose: Nose normal.  Eyes:     General: No scleral icterus.    Conjunctiva/sclera: Conjunctivae normal.  Neck:     Thyroid: No thyromegaly.  Cardiovascular:     Rate and Rhythm: Normal rate and regular rhythm.     Heart sounds: No murmur heard.  No friction rub. No gallop.   Pulmonary:     Breath sounds: No stridor. No wheezing or rales.  Chest:     Chest wall: No tenderness.  Abdominal:     General: There is no distension.     Tenderness: There is no abdominal tenderness. There is no rebound.  Musculoskeletal:        General: Normal range of motion.     Cervical back: Neck supple.  Lymphadenopathy:     Cervical: No cervical adenopathy.  Skin:    Findings: No erythema or rash.  Neurological:     Mental Status: She is alert and oriented to person, place, and time.     Motor: No abnormal muscle tone.     Coordination: Coordination normal.  Psychiatric:        Behavior: Behavior normal.     ED Results / Procedures / Treatments   Labs (all labs ordered are listed, but only abnormal results are displayed) Labs Reviewed  CBC WITH DIFFERENTIAL/PLATELET - Abnormal; Notable for the following components:      Result Value   RBC 5.30 (*)    Hemoglobin 11.9 (*)    MCV 72.1 (*)    MCH 22.5 (*)    RDW 16.2 (*)    All other components within normal limits  COMPREHENSIVE METABOLIC PANEL - Abnormal; Notable for the following components:   Calcium 8.7 (*)    AST 14 (*)    All other components within normal limits  RAPID URINE DRUG SCREEN, HOSP  PERFORMED - Abnormal; Notable for the following components:   Tetrahydrocannabinol POSITIVE (*)    All other components within normal limits  ETHANOL  PREGNANCY, URINE  URINALYSIS, ROUTINE W REFLEX MICROSCOPIC  CBG MONITORING, ED  POC URINE PREG, ED    EKG None  Radiology  CT Head Wo Contrast  Result Date: 01/09/2020 CLINICAL DATA:  Possible seizure at home. EXAM: CT HEAD WITHOUT CONTRAST TECHNIQUE: Contiguous axial images were obtained from the base of the skull through the vertex without intravenous contrast. COMPARISON:  None. FINDINGS: Brain: No evidence of acute infarction, hemorrhage, hydrocephalus, extra-axial collection or mass lesion/mass effect. Vascular: No hyperdense vessel or unexpected calcification. Skull: Normal. Negative for fracture or focal lesion. Sinuses/Orbits: No acute finding. Other: None. IMPRESSION: No acute intracranial pathology. Electronically Signed   By: Emmaline Kluver M.D.   On: 01/09/2020 12:46    Procedures Procedures (including critical care time)  Medications Ordered in ED Medications - No data to display  ED Course  I have reviewed the triage vital signs and the nursing notes.  Pertinent labs & imaging results that were available during my care of the patient were reviewed by me and considered in my medical decision making (see chart for details).    MDM Rules/Calculators/A&P                          Patient with new onset seizures.  CT scan unremarkable.  She will follow up with Dr. Gerilyn Pilgrim and is given seizure precautions Final Clinical Impression(s) / ED Diagnoses Final diagnoses:  Seizure [facility])    Rx / DC Orders ED Discharge Orders    None       Bethann Berkshire, MD 01/13/20 [phone]

## 2020-01-10 ENCOUNTER — Encounter: Payer: Self-pay | Admitting: Neurology

## 2020-01-14 ENCOUNTER — Other Ambulatory Visit: Payer: Self-pay

## 2020-01-14 ENCOUNTER — Encounter: Payer: Self-pay | Admitting: Neurology

## 2020-01-14 ENCOUNTER — Ambulatory Visit (INDEPENDENT_AMBULATORY_CARE_PROVIDER_SITE_OTHER): Payer: Medicaid Other | Admitting: Neurology

## 2020-01-14 VITALS — BP 124/88 | HR 67 | Ht 64.0 in | Wt 198.4 lb

## 2020-01-14 DIAGNOSIS — R569 Unspecified convulsions: Secondary | ICD-10-CM

## 2020-01-14 DIAGNOSIS — G43009 Migraine without aura, not intractable, without status migrainosus: Secondary | ICD-10-CM | POA: Diagnosis not present

## 2020-01-14 NOTE — Progress Notes (Signed)
NEUROLOGY CONSULTATION NOTE  Alexis [name] MRN: [id number] DOB: January 14, 1996  Referring provider: Dr. Bethann Berkshire (ER) Primary care provider: none listed  Reason for consult:  seizure  Dear Dr Estell Harpin:  Thank you for your kind referral of Alexis [name] for consultation of the above symptoms. Although her history is well known to you, please allow me to reiterate it for the purpose of our medical record. The patient was accompanied to the clinic by her sister who also provides collateral information. Records and images were personally reviewed where available.   HISTORY OF PRESENT ILLNESS: This is a 24 year old right-handed woman with a history of migraines presenting for evaluation of new onset seizure last 01/09/2020. She recalls her sister coming in to talk to her, then waking up to EMS around her. Her sister reports they were talking when she suddenly broke down crying then started to shake. Her sister hugged her and noticed her body was getting very heavy and started locking up with extremities tense and straight followed by intense shaking. There was saliva and nasal discharge coming out. Her breathing seemed to stop so her sister started CPR, then she was gasping for air saying she could not breathe. When EMS arrived, she was answering questions correctly. No tongue bite or incontinence. She denies any prior warning symptoms. She did not get good sleep the nights prior and has been under a lot of stress. No alcohol use. She uses marijuana. She was brought to [facility] ER where CBC, CMP were unremarkable, UDS positive for THC. I personally reviewed head CT without contrast which did not show any acute changes.   Since the seizure, she has had "weird feelings" where she does not feel right, like a dizziness/floating sensation and nausea lasting 30-60 minutes. She would feel worried, no confusion or speech difficulties. They report episodes of hand shaking, right more than left, she  cannot control it. Her sister would hold her hand and it would continue to shake for 5-7 minutes. The shaking mostly affects her upper body. She spaces out a lot, her sister notes she gets focused when watching videos on her phone and has to be called three times. She denies any olfactory/gustatory hallucinations, deja vu, rising epigastric sensation. She has intermittent numbness and tingling in both hands. Her sister shows a video of her a few days prior to the seizure where she is lying in bed and has a whole body jerk. She did not remember this.   She has a history of migraines with associated nausea, photo/phonophobia. She used to take Fioricet. Migraines occur every other day, she is just getting over one this morning. Ibuprofen does not help, Aleve helped calm it down a little. She has some back pain. No diplopia, dysarthria/dysphagia, neck pain, bowel/bladder dysfunction. She had a normal birth and early development.  There is no history of febrile convulsions, CNS infections such as meningitis/encephalitis, significant traumatic brain injury, neurosurgical procedures, or family history of seizures.   PAST MEDICAL HISTORY: Past Medical History:  Diagnosis Date  . Anemia   . Chlamydia   . Shingles outbreak 10/2018  . Trichomonas infection     PAST SURGICAL HISTORY: Past Surgical History:  Procedure Laterality Date  . NO PAST SURGERIES      MEDICATIONS: Current Outpatient Medications on File Prior to Visit  Medication Sig Dispense Refill  . acetaminophen (TYLENOL) 325 MG tablet Take 2 tablets (650 mg total) by mouth every 6 (six) hours as needed. 30 tablet 0  .  acyclovir (ZOVIRAX) 400 MG tablet Take 1 tablet (400 mg total) by mouth 3 (three) times daily. (Patient not taking: Reported on 01/09/2020) 21 tablet 0  . benzonatate (TESSALON PERLES) 100 MG capsule Take 1 capsule (100 mg total) by mouth 3 (three) times daily as needed. (Patient not taking: Reported on 01/09/2020) 20 capsule 0   . ferrous sulfate 325 (65 FE) MG tablet Take 1 tablet (325 mg total) by mouth daily. (Patient not taking: Reported on 01/09/2020) 30 tablet 1  . ibuprofen (ADVIL) 600 MG tablet Take 1 tablet (600 mg total) by mouth every 6 (six) hours as needed. (Patient not taking: Reported on 01/09/2020) 30 tablet 0  . ondansetron (ZOFRAN ODT) 4 MG disintegrating tablet Take 1-2 tablets (4-8 mg total) by mouth every 6 (six) hours as needed for nausea or vomiting. (Patient not taking: Reported on 01/09/2020) 20 tablet 0  . predniSONE (STERAPRED UNI-PAK 21 TAB) 10 MG (21) TBPK tablet Use as directed (Patient not taking: Reported on 01/09/2020) 21 tablet 0  . Prenatal Vit-Fe Fumarate-FA (PRENATAL COMPLETE) 14-0.4 MG TABS Take 1 tablet by mouth daily. (Patient not taking: Reported on 01/09/2020) 60 each 0  . senna (SENOKOT) 8.6 MG TABS tablet Take 1 tablet (8.6 mg total) by mouth daily as needed. (Patient not taking: Reported on 01/09/2020) 30 tablet 0   No current facility-administered medications on file prior to visit.    ALLERGIES: No Known Allergies  FAMILY HISTORY: History reviewed. No pertinent family history.  SOCIAL HISTORY: Social History   Socioeconomic History  . Marital status: Single    Spouse name: Not on file  . Number of children: Not on file  . Years of education: Not on file  . Highest education level: Not on file  Occupational History  . Not on file  Tobacco Use  . Smoking status: Heavy Tobacco Smoker    Packs/day: 0.50    Types: Cigarettes  . Smokeless tobacco: Never Used  Vaping Use  . Vaping Use: Never used  Substance and Sexual Activity  . Alcohol use: No  . Drug use: Not Currently  . Sexual activity: Yes    Birth control/protection: None  Other Topics Concern  . Not on file  Social History Narrative  . Not on file   Social Determinants of Health   Financial Resource Strain:   . Difficulty of Paying Living Expenses: Not on file  Food Insecurity:   . Worried  About Programme researcher, broadcasting/film/video in the Last Year: Not on file  . Ran Out of Food in the Last Year: Not on file  Transportation Needs:   . Lack of Transportation (Medical): Not on file  . Lack of Transportation (Non-Medical): Not on file  Physical Activity:   . Days of Exercise per Week: Not on file  . Minutes of Exercise per Session: Not on file  Stress:   . Feeling of Stress : Not on file  Social Connections:   . Frequency of Communication with Friends and Family: Not on file  . Frequency of Social Gatherings with Friends and Family: Not on file  . Attends Religious Services: Not on file  . Active Member of Clubs or Organizations: Not on file  . Attends Banker Meetings: Not on file  . Marital Status: Not on file  Intimate Partner Violence:   . Fear of Current or Ex-Partner: Not on file  . Emotionally Abused: Not on file  . Physically Abused: Not on file  . Sexually Abused:  Not on file     PHYSICAL EXAM: Vitals:   01/14/20 1303  BP: 124/88  Pulse: 67  SpO2: 100%   General: No acute distress Head:  Normocephalic/atraumatic Skin/Extremities: No rash, no edema Neurological Exam: Mental status: alert and oriented to person, place, and time, no dysarthria or aphasia, Fund of knowledge is appropriate.  Recent and remote memory are impaired, 1/3 delayed recall.  Attention and concentration are normal, 5/5 WORLD backwards. Cranial nerves: CN I: not tested CN II: pupils equal, round and reactive to light, visual fields intact CN III, IV, VI:  full range of motion, no nystagmus, no ptosis CN V: facial sensation intact CN VII: upper and lower face symmetric CN VIII: hearing intact to conversation Bulk & Tone: normal, no fasciculations. Motor: 5/5 throughout with no pronator drift. Sensation: intact to light touch, cold, pin, vibration  sense.   Romberg test negative Deep Tendon Reflexes: +2 throughout Cerebellar: no incoordination on finger to nose testing Gait:  narrow-based and steady, able to tandem walk adequately. Tremor: none   IMPRESSION: This is a 24 year old right-handed woman with a history of migraines, presenting with new onset seizure last 01/09/2020. Etiology unclear, we discussed different causes of seizures. 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 She has been under a lot of stress. Her sister also shows a video of a whole body jerk and they report bilateral hand shaking, R>L. MRI brain with and without contrast and 1-hour EEG will be ordered to assess for focal abnormalities that increase risk for recurrent seizures. She has frequent migraines, we will consider starting Topiramate for migraine prophylaxis after completion of tests. [place] driving laws were discussed with the patient, and she knows to stop driving after a seizure, until 6 months seizure-free.    Thank you for allowing me to participate in the care of this patient. Please do not hesitate to call for any questions or concerns.   Patrcia Dolly, M.D.  CC: Dr. Estell Harpin

## 2020-01-14 NOTE — Patient Instructions (Signed)
1. Schedule MRI brain with and without contrast  2. Schedule 1-hour EEG  3. Follow-up in 3 months, call for any changes   Seizure Precautions: 1. If medication has been prescribed for you to prevent seizures, take it exactly as directed.  Do not stop taking the medicine without talking to your doctor first, even if you have not had a seizure in a long time.   2. Avoid activities in which a seizure would cause danger to yourself or to others.  Don't operate dangerous machinery, swim alone, or climb in high or dangerous places, such as on ladders, roofs, or girders.  Do not drive unless your doctor says you may.  3. If you have any warning that you may have a seizure, lay down in a safe place where you can't hurt yourself.    4.  No driving for 6 months from last seizure, as per [facility].   Please refer to the following link on the Epile[employer] Foundation of America's website for more information: http://www.epilepsyfoundation.org/answerplace/Social/driving/drivingu.cfm   5.  Maintain good sleep hygiene. Avoid alcohol.  6.  Notify your neurology if you are planning pregnancy or if you become pregnant.  7.  Contact your doctor if you have any problems that may be related to the medicine you are taking.  8.  Call 911 and bring the patient back to the ED if:        A.  The seizure lasts longer than 5 minutes.       B.  The patient doesn't awaken shortly after the seizure  C.  The patient has new problems such as difficulty seeing, speaking or moving  D.  The patient was injured during the seizure  E.  The patient has a temperature over 102 F (39C)  F.  The patient vomited and now is having trouble breathing

## 2020-01-22 ENCOUNTER — Other Ambulatory Visit: Payer: Self-pay

## 2020-01-22 ENCOUNTER — Ambulatory Visit
Admission: EM | Admit: 2020-01-22 | Discharge: 2020-01-22 | Disposition: A | Discharge status: Home / Self Care | Payer: Medicaid Other | Attending: Emergency Medicine | Admitting: Emergency Medicine

## 2020-01-22 ENCOUNTER — Ambulatory Visit: Admit: 2020-01-22 | Discharge status: Absent without leave | Payer: Medicaid Other

## 2020-01-22 ENCOUNTER — Encounter: Payer: Self-pay | Admitting: Emergency Medicine

## 2020-01-22 DIAGNOSIS — M549 Dorsalgia, unspecified: Secondary | ICD-10-CM

## 2020-01-22 DIAGNOSIS — M62830 Muscle spasm of back: Secondary | ICD-10-CM

## 2020-01-22 MED ORDER — CYCLOBENZAPRINE HCL 10 MG PO TABS: 10.0000 mg | ORAL_TABLET | Freq: Every day | ORAL | 0 refills | Status: DC

## 2020-01-22 MED ORDER — MELOXICAM 15 MG PO TABS: 15.0000 mg | ORAL_TABLET | Freq: Every day | ORAL | 0 refills | Status: DC

## 2020-01-22 NOTE — ED Triage Notes (Signed)
pt states she had a seizure yesterday and states her body is sore. pt states she tried to go to work but feels she has over done it. pt would like a work note

## 2020-01-22 NOTE — ED Provider Notes (Signed)
[facility]   [id number] 01/22/20 Arrival Time: 1736  CC: Back pain  SUBJECTIVE: History from: patient. Alexis [name] is a 24 y.o. female complains of back pain that began 1 day ago.  Recently diagnosed with seizures, had a seizure yesterday that caused her back to spasm.  Localizes the pain to the entire back.  Describes the pain as constant, throbbing, sharp, dull and achy in character.  States pain is "10"/10.  Has tried OTC medications without relief.  Symptoms are made worse with work.  Denies similar symptoms in the past.  Denies fever, chills, erythema, ecchymosis, effusion, weakness, numbness and tingling, saddle paresthesias, loss of bowel or bladder function.      Requests a work note.    ROS: As per HPI.  All other pertinent ROS negative.     Past Medical History:  Diagnosis Date   Anemia    Chlamydia    Shingles outbreak 10/2018   Trichomonas infection    Past Surgical History:  Procedure Laterality Date   NO PAST SURGERIES     No Known Allergies No current facility-administered medications on file prior to encounter.   Current Outpatient Medications on File Prior to Encounter  Medication Sig Dispense Refill   acetaminophen (TYLENOL) 325 MG tablet Take 2 tablets (650 mg total) by mouth every 6 (six) hours as needed. 30 tablet 0   Social History   Socioeconomic History   Marital status: Single    Spouse name: Not on file   Number of children: Not on file   Years of education: Not on file   Highest education level: Not on file  Occupational History   Not on file  Tobacco Use   Smoking status: Heavy Tobacco Smoker    Packs/day: 0.50    Types: Cigarettes   Smokeless tobacco: Never Used  Vaping Use   Vaping Use: Never used  Substance and Sexual Activity   Alcohol use: No   Drug use: Not Currently   Sexual activity: Yes    Birth control/protection: None  Other Topics Concern   Not on file  Social History Narrative   Right  handed   Lives with family    Social Determinants of Health   Financial Resource Strain:    Difficulty of Paying Living Expenses: Not on file  Food Insecurity:    Worried About Running Out of Food in the Last Year: Not on file   [employer] of Food in the Last Year: Not on file  Transportation Needs:    Lack of Transportation (Medical): Not on file   Lack of Transportation (Non-Medical): Not on file  Physical Activity:    Days of Exercise per Week: Not on file   Minutes of Exercise per Session: Not on file  Stress:    Feeling of Stress : Not on file  Social Connections:    Frequency of Communication with Friends and Family: Not on file   Frequency of Social Gatherings with Friends and Family: Not on file   Attends Religious Services: Not on file   Active Member of Clubs or Organizations: Not on file   Attends Banker Meetings: Not on file   Marital Status: Not on file  Intimate Partner Violence:    Fear of Current or Ex-Partner: Not on file   Emotionally Abused: Not on file   Physically Abused: Not on file   Sexually Abused: Not on file   No family history on file.  OBJECTIVE:  Vitals:  01/22/20 1838  BP: 132/84  Pulse: 69  Resp: 16  Temp: 98.3 F (36.8 C)  TempSrc: Oral  SpO2: 99%    General appearance: ALERT; in no acute distress.  Head: NCAT Lungs: Normal respiratory effort Musculoskeletal: Back Inspection: Skin warm, dry, clear and intact without obvious erythema, effusion, or ecchymosis.  Palpation: diffusely TTP over back ROM: FROM active and passive Strength: 5/5 shld abduction, 5/5 shld adduction, 5/5 elbow flexion, 5/5 elbow extension, 5/5 grip strength, 5/5 hip flexion, 5/5 hip extension Skin: warm and dry Neurologic: Ambulates without difficulty; Sensation intact about the upper/ lower extremities  Psychological: alert and cooperative; normal mood and affect  ASSESSMENT & PLAN:  1. Acute bilateral back pain, unspecified  back location   2. Back muscle spasm    Meds ordered this encounter  Medications   meloxicam (MOBIC) 15 MG tablet    Sig: Take 1 tablet (15 mg total) by mouth daily.    Dispense:  30 tablet    Refill:  0    Order Specific Question:   Supervising Provider    Answer:   Eustace Moore [[id number]]   cyclobenzaprine (FLEXERIL) 10 MG tablet    Sig: Take 1 tablet (10 mg total) by mouth at bedtime.    Dispense:  15 tablet    Refill:  0    Order Specific Question:   Supervising Provider    Answer:   Eustace Moore [[id number]]   Continue conservative management of rest, ice, and gentle stretches Take mobic as needed for pain relief (may cause abdominal discomfort, ulcers, and GI bleeds avoid taking with other NSAIDs) Take cyclobenzaprine at nighttime for symptomatic relief. Avoid driving or operating heavy machinery while using medication. Follow up with PCP if symptoms persist Return or go to the ER if you have any new or worsening symptoms (fever, chills, chest pain, abdominal pain, changes in bowel or bladder habits, pain radiating into lower legs, etc...)   Reviewed expectations re: course of current medical issues. Questions answered. Outlined signs and symptoms indicating need for more acute intervention. Patient verbalized understanding. After Visit Summary given.    Rennis Harding, PA-C 01/22/20 1913

## 2020-01-22 NOTE — Discharge Instructions (Addendum)
Continue conservative management of rest, ice, and gentle stretches Take mobic as needed for pain relief (may cause abdominal discomfort, ulcers, and GI bleeds avoid taking with other NSAIDs).  DO NOT take if breast feeding Take cyclobenzaprine at nighttime for symptomatic relief. Avoid driving or operating heavy machinery while using medication. Follow up with PCP if symptoms persist Return or go to the ER if you have any new or worsening symptoms (fever, chills, chest pain, abdominal pain, changes in bowel or bladder habits, pain radiating into lower legs, etc...)

## 2020-01-23 ENCOUNTER — Telehealth: Payer: Self-pay | Admitting: Neurology

## 2020-01-23 ENCOUNTER — Ambulatory Visit (INDEPENDENT_AMBULATORY_CARE_PROVIDER_SITE_OTHER): Payer: Medicaid Other | Admitting: Neurology

## 2020-01-23 DIAGNOSIS — R569 Unspecified convulsions: Secondary | ICD-10-CM

## 2020-01-23 DIAGNOSIS — G43009 Migraine without aura, not intractable, without status migrainosus: Secondary | ICD-10-CM

## 2020-01-23 NOTE — Telephone Encounter (Signed)
Patient came in for an EEG today and wanted me to make sure Dr. Karel Jarvis is aware that she had to go to the urgent care yesterday on 01/22/20 due to having a seizure on 01/21/20.

## 2020-01-27 NOTE — Telephone Encounter (Signed)
Pls let her know that the EEG was normal, however it is not like a pregnancy test that is positive or negative. Recommend doing a 48-hour EEG (pls explain 48-hour EEG to her), so we can try to capture her seizures and see where they are coming from. Proceed with MRI if not yet done. Thanks

## 2020-01-27 NOTE — Addendum Note (Signed)
Addended by: Dimas Chyle on: 01/27/2020 11:03 AM   Modules accepted: Orders

## 2020-01-27 NOTE — Telephone Encounter (Signed)
Pt called and informed that EEG was normal, however it is not like a pregnancy test that is positive or negative. Recommend doing a 48-hour EEG (pls explain 48-hour EEG to her), so we can try to capture her seizures and see where they are coming from. Proceed with MRI if not yet done.

## 2020-01-29 ENCOUNTER — Other Ambulatory Visit: Payer: Self-pay

## 2020-01-29 ENCOUNTER — Ambulatory Visit (INDEPENDENT_AMBULATORY_CARE_PROVIDER_SITE_OTHER): Payer: Medicaid Other | Admitting: Neurology

## 2020-01-29 DIAGNOSIS — R569 Unspecified convulsions: Secondary | ICD-10-CM | POA: Diagnosis not present

## 2020-02-03 ENCOUNTER — Other Ambulatory Visit: Payer: Medicaid Other

## 2020-02-24 NOTE — Procedures (Signed)
ELECTROENCEPHALOGRAM REPORT  Dates of Recording: 01/29/2020 11:40AM to 01/31/2020 12:20PM Patient's Name: Alexis [name] MRN: [id number] Date of Birth: September 18, 1995  Referring Provider: Dr. Patrcia Dolly  Procedure: 48-hour ambulatory video EEG  History: This is a 25 year old woman with new onset seizures. EEG for classification.  Medications:  Flexeril Meloxicam  Technical Summary: This is a 48-hour multichannel digital video EEG recording measured by the international 10-20 system with electrodes applied with paste and impedances below 5000 ohms performed as portable with EKG monitoring.  The digital EEG was referentially recorded, reformatted, and digitally filtered in a variety of bipolar and referential montages for optimal display.    DESCRIPTION OF RECORDING: During maximal wakefulness, the background activity consisted of a symmetric 10 Hz posterior dominant rhythm which was reactive to eye opening.  There were no epileptiform discharges or focal slowing seen in wakefulness.  During the recording, the patient progresses through wakefulness, drowsiness, and Stage 2 sleep.  Again, there were no epileptiform discharges seen.  Events: On 12/8 at 1949 hours, she has a migraine. Patient not on video. Electrographically, there were no EEG or EKG changes seen.  On 12/9 at 1325 hours, she has a migraine. Patient not on video. Electrographically, there were no EEG or EKG changes seen.  There were no electrographic seizures seen.  EKG lead was unremarkable.  IMPRESSION: This 48-hour ambulatory video EEG study is normal.    CLINICAL CORRELATION: A normal EEG does not exclude a clinical diagnosis of epilepsy.  If further clinical questions remain, inpatient video EEG monitoring may be helpful.   Patrcia Dolly, M.D.

## 2020-02-24 NOTE — Procedures (Signed)
ELECTROENCEPHALOGRAM REPORT  Date of Study: 01/23/2020  Patient's Name: Alexis [name] MRN: [id number] Date of Birth: [birth year]  Referring Provider: Dr. Patrcia Dolly  Clinical History: This is a 25 year old woman with new onset seizures. EEG for classification.   Medications: Prenatal vitamins  Technical Summary: A multichannel digital 1-hour EEG recording measured by the international 10-20 system with electrodes applied with paste and impedances below 5000 ohms performed in our laboratory with EKG monitoring in an awake and asleep patient.  Hyperventilation was not performed. Photic stimulation was performed.  The digital EEG was referentially recorded, reformatted, and digitally filtered in a variety of bipolar and referential montages for optimal display.    Description: The patient is awake and asleep during the recording.  During maximal wakefulness, there is a symmetric, medium voltage 9-10 Hz posterior dominant rhythm that attenuates with eye opening.  The record is symmetric.  During drowsiness and sleep, there is an increase in theta slowing of the background.  Vertex waves and symmetric sleep spindles were seen. Photic stimulation did not elicit any abnormalities.  There were no epileptiform discharges or electrographic seizures seen.    EKG lead was unremarkable.  Impression: This 1-hour awake and asleep EEG is normal.    Clinical Correlation: A normal EEG does not exclude a clinical diagnosis of epilepsy.  If further clinical questions remain, prolonged EEG may be helpful.  Clinical correlation is advised.   Patrcia Dolly, M.D.

## 2020-03-23 ENCOUNTER — Other Ambulatory Visit: Payer: Self-pay

## 2020-03-23 ENCOUNTER — Encounter: Payer: Self-pay | Admitting: Emergency Medicine

## 2020-03-23 ENCOUNTER — Ambulatory Visit
Admission: EM | Admit: 2020-03-23 | Discharge: 2020-03-23 | Disposition: A | Discharge status: Home / Self Care | Payer: Medicaid Other | Attending: Family Medicine | Admitting: Family Medicine

## 2020-03-23 DIAGNOSIS — M545 Low back pain, unspecified: Secondary | ICD-10-CM | POA: Diagnosis not present

## 2020-03-23 DIAGNOSIS — Z8669 Personal history of other diseases of the nervous system and sense organs: Secondary | ICD-10-CM

## 2020-03-23 DIAGNOSIS — M62838 Other muscle spasm: Secondary | ICD-10-CM | POA: Diagnosis not present

## 2020-03-23 HISTORY — DX: Unspecified convulsions: R56.9

## 2020-03-23 MED ORDER — MELOXICAM 15 MG PO TABS: 15.0000 mg | ORAL_TABLET | Freq: Every day | ORAL | 0 refills | Status: DC

## 2020-03-23 MED ORDER — CYCLOBENZAPRINE HCL 10 MG PO TABS: 10.0000 mg | ORAL_TABLET | Freq: Every day | ORAL | 0 refills | Status: DC

## 2020-03-23 NOTE — ED Provider Notes (Signed)
[facility]   [id number] 03/23/20 Arrival Time: 1728  NI:DPOEU PAIN  SUBJECTIVE: History from: patient. Alexis [name] is a 25 y.o. female complains of back spasms that began after having a seizure x 2 days ago. Denies a precipitating event or specific injury.   Describes the pain as intermittent and sharp in character.  Has tried OTC medications without relief. Symptoms are made worse with activity. Denies similar symptoms in the past. Denies fever, chills, erythema, ecchymosis, effusion, weakness, numbness and tingling, saddle paresthesias, loss of bowel or bladder function.      ROS: As per HPI.  All other pertinent ROS negative.     Past Medical History:  Diagnosis Date  . Anemia   . Chlamydia   . Seizures (HCC)   . Shingles outbreak 10/2018  . Trichomonas infection    Past Surgical History:  Procedure Laterality Date  . NO PAST SURGERIES     No Known Allergies No current facility-administered medications on file prior to encounter.   Current Outpatient Medications on File Prior to Encounter  Medication Sig Dispense Refill  . acetaminophen (TYLENOL) 325 MG tablet Take 2 tablets (650 mg total) by mouth every 6 (six) hours as needed. 30 tablet 0   Social History   Socioeconomic History  . Marital status: Single    Spouse name: Not on file  . Number of children: Not on file  . Years of education: Not on file  . Highest education level: Not on file  Occupational History  . Not on file  Tobacco Use  . Smoking status: Heavy Tobacco Smoker    Packs/day: 0.50    Types: Cigarettes  . Smokeless tobacco: Never Used  Vaping Use  . Vaping Use: Never used  Substance and Sexual Activity  . Alcohol use: No  . Drug use: Not Currently  . Sexual activity: Yes    Birth control/protection: None  Other Topics Concern  . Not on file  Social History Narrative   Right handed   Lives with family    Social Determinants of Health   Financial Resource Strain: Not on  file  Food Insecurity: Not on file  Transportation Needs: Not on file  Physical Activity: Not on file  Stress: Not on file  Social Connections: Not on file  Intimate Partner Violence: Not on file   History reviewed. No pertinent family history.  OBJECTIVE:  Vitals:   03/23/20 1746 03/23/20 1747  BP: 125/84   Pulse: 84   Resp: 18   Temp: 98.4 F (36.9 C)   TempSrc: Oral   SpO2: 98%   Weight:  190 lb (86.2 kg)  Height:  5\' 5"  (1.651 m)    General appearance: ALERT; in no acute distress.  Head: NCAT Lungs: Normal respiratory effort CV: pulses 2+ bilaterally. Cap refill < 2 seconds Musculoskeletal:  Inspection: Skin warm, dry, clear and intact No erythema, effusion to lowback Palpation: bilateral low back tender to palpation ROM: Limited ROM active and passive to low back with bending, twisting, changing positions Skin: warm and dry Neurologic: Ambulates without difficulty; Sensation intact about the upper/ lower extremities Psychological: alert and cooperative; normal mood and affect  DIAGNOSTIC STUDIES:  No results found.   ASSESSMENT & PLAN:  1. Acute bilateral low back pain without sciatica   2. Muscle spasm   3. Hx of tonic-clonic seizures     Meds ordered this encounter  Medications  . cyclobenzaprine (FLEXERIL) 10 MG tablet    Sig: Take 1 tablet (  10 mg total) by mouth at bedtime.    Dispense:  30 tablet    Refill:  0    Order Specific Question:   Supervising Provider    Answer:   Merrilee Jansky [id number]  . meloxicam (MOBIC) 15 MG tablet    Sig: Take 1 tablet (15 mg total) by mouth daily.    Dispense:  30 tablet    Refill:  0    Order Specific Question:   Supervising Provider    Answer:   Merrilee Jansky [[id number]]   Is not currently on antiepileptic medications States that she is being followed by [facility] Prescribed meloxicam Prescribed cyclobenzaprine Continue conservative management of rest, ice, and gentle stretches Take ibuprofen  as needed for pain relief (may cause abdominal discomfort, ulcers, and GI bleeds avoid taking with other NSAIDs) Take cyclobenzaprine at nighttime for symptomatic relief. Avoid driving or operating heavy machinery while using medication. Follow up with neuro as scheduled Follow up with PCP if symptoms persist Return or go to the ER if you have any new or worsening symptoms (fever, chills, chest pain, abdominal pain, changes in bowel or bladder habits, pain radiating into lower legs)  Reviewed expectations re: course of current medical issues. Questions answered. Outlined signs and symptoms indicating need for more acute intervention. Patient verbalized understanding. After Visit Summary given.       Moshe Cipro, NP 03/23/20 1814

## 2020-03-23 NOTE — Discharge Instructions (Signed)
I have prescribed Meloxicam for you to take daily. Do not take ibuprofen with this medication. You may take tylenol with this.  I have sent in flexeril for you to take twice a day as needed for muscle spasms. This medication can make you sleepy. Do not drive or operate heavy machinery with this medication.  Follow up with this office or with primary care if symptoms are persisting.  Follow up in the ER for high fever, trouble swallowing, trouble breathing, other concerning symptoms.

## 2020-03-23 NOTE — ED Triage Notes (Signed)
Had 2 seizures on Friday and states she had been having muscle spasms in her back that won't go away since Friday

## 2020-03-28 IMAGING — US OBSTETRIC <14 WK US AND TRANSVAGINAL OB US
1 series · 15 of 28 positions shown · non-contrast
Comparison: None.

CLINICAL DATA: Abdominal pain in 1st trimester pregnancy.

EXAM:
OBSTETRIC <14 WK US AND TRANSVAGINAL OB US
TECHNIQUE: Both transabdominal and transvaginal ultrasound examinations were
performed for complete evaluation of the gestation as well as the
maternal uterus, adnexal regions, and pelvic cul-de-sac.
Transvaginal technique was performed to assess early pregnancy.

[Series 1: obstetric <14 wk us and transvaginal ob us · 15 of 71 slices shown]
[im 1/71]
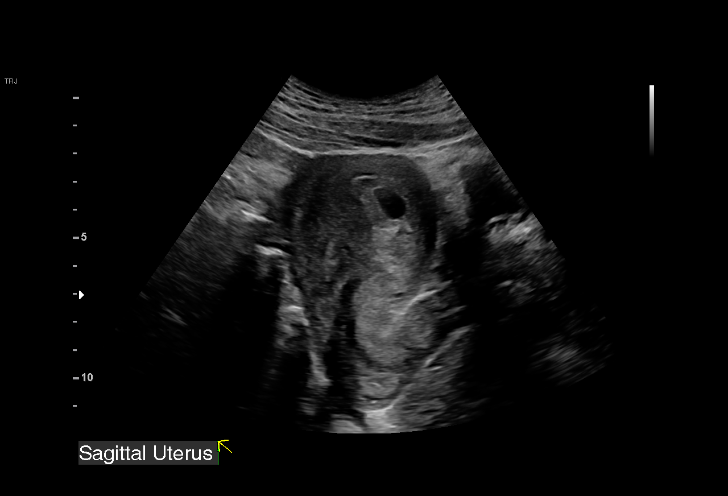
[im 6/71]
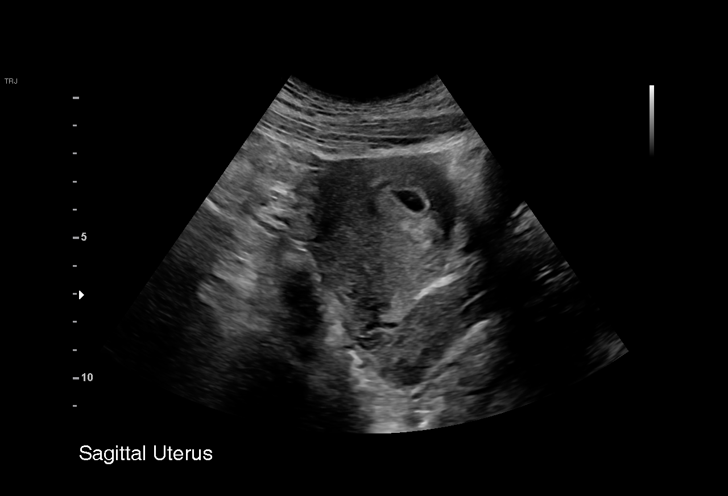
[im 11/71]
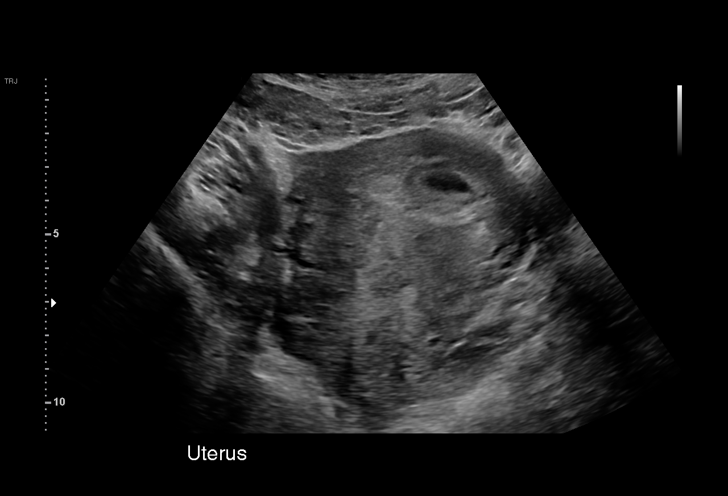
[im 16/71]
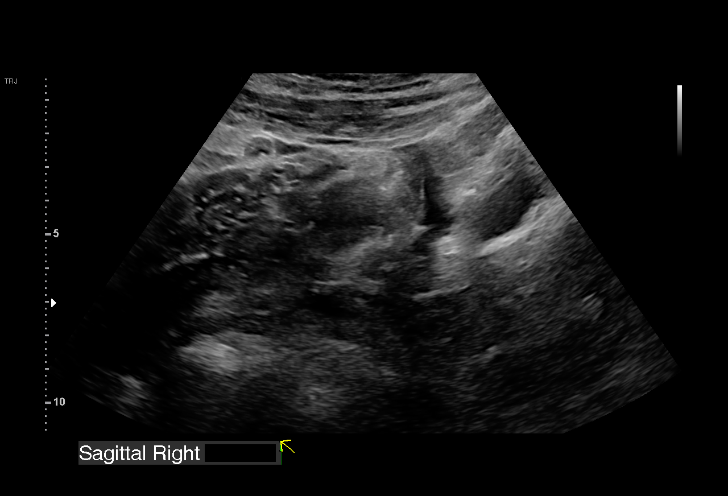
[im 21/71]
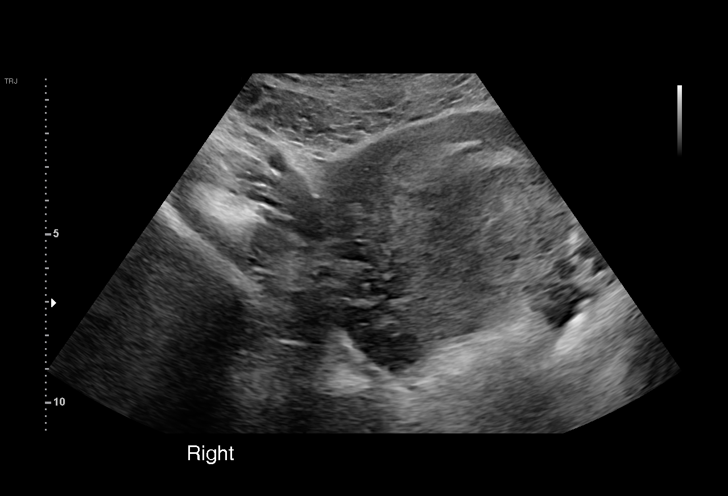
[im 26/71]
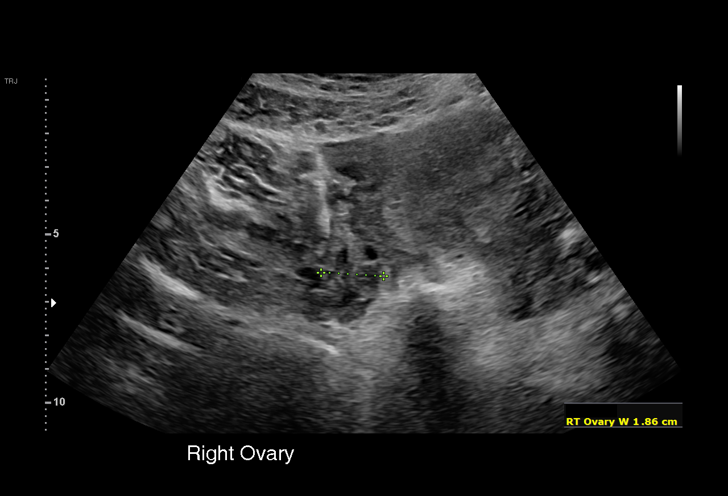
[im 32/71]
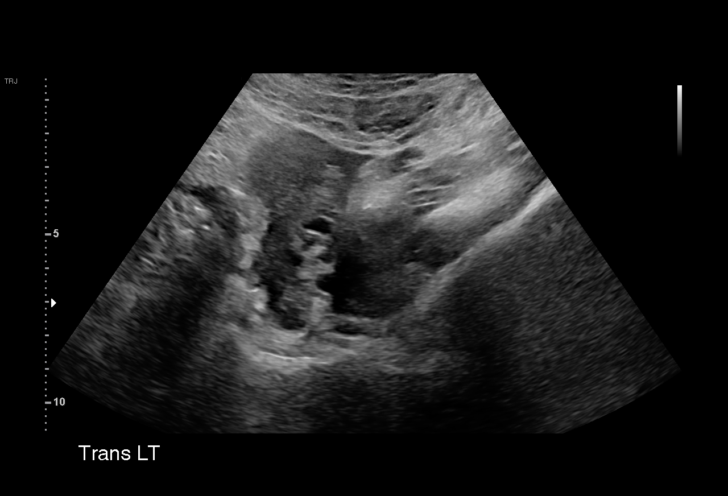
[im 37/71]
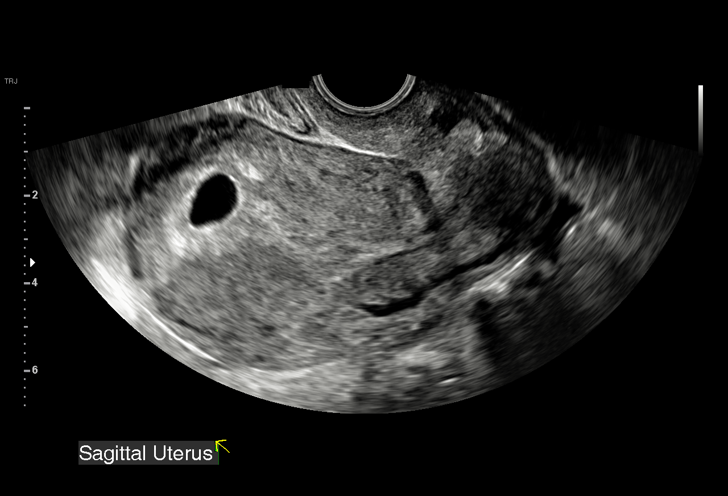
[im 39/71]
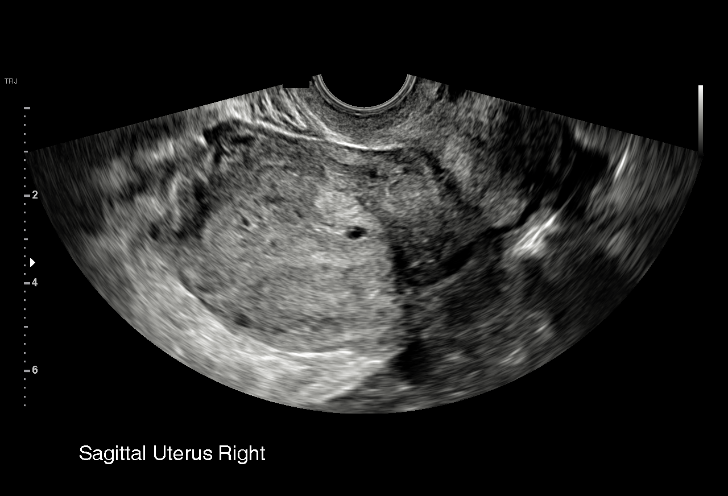
[im 45/71]
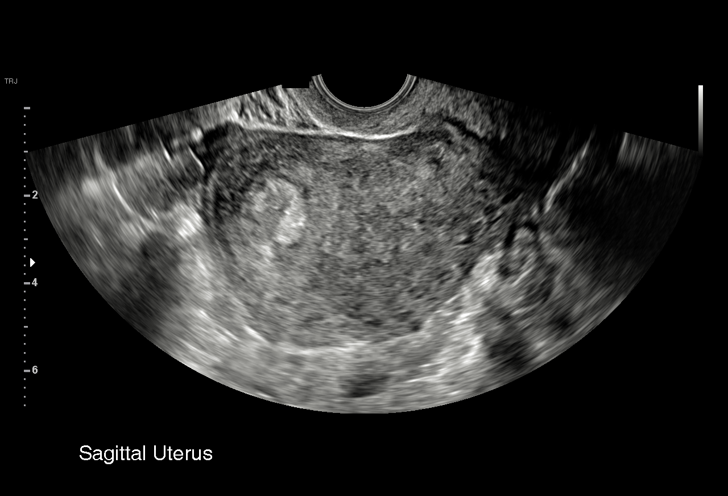
[im 50/71]
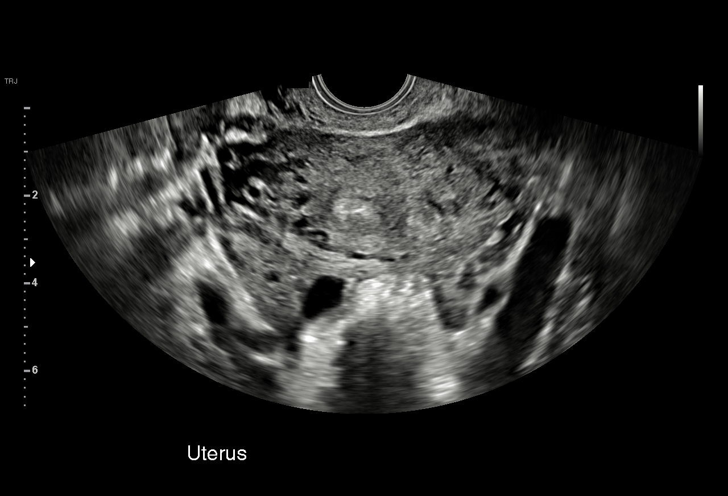
[im 55/71]
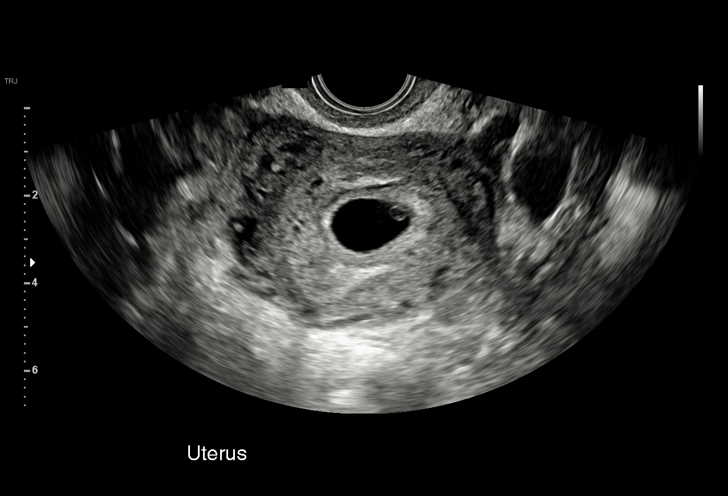
[im 60/71]
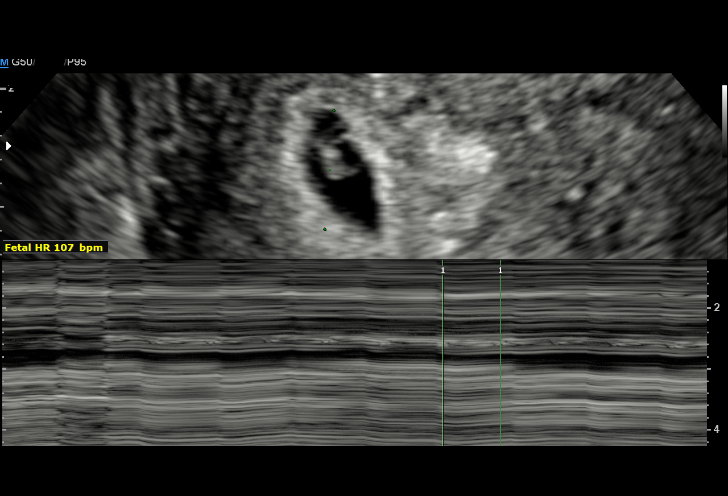
[im 65/71]
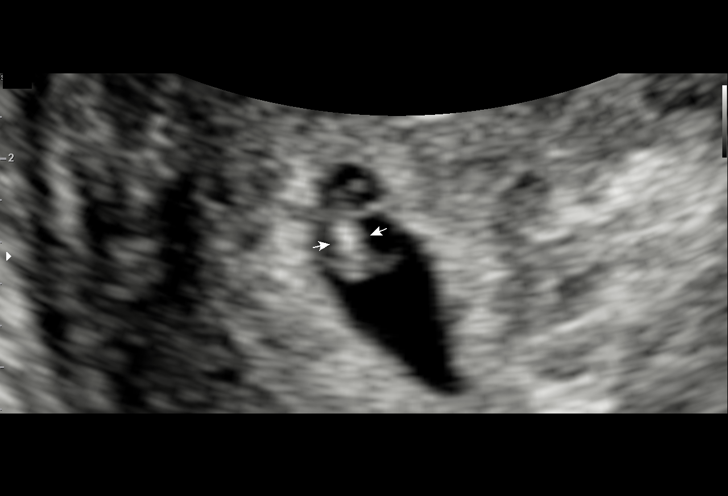
[im 71/71]
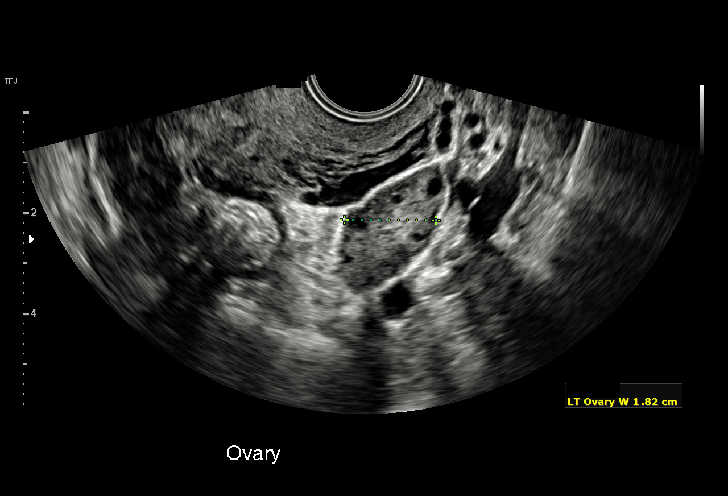

[15 of 28 positions shown; findings below may reference images not displayed]

FINDINGS: Intrauterine gestational sac: Single

Yolk sac:  Visualized.

Embryo:  Visualized.

Cardiac Activity: Visualized.

Heart Rate: 106 bpm

CRL:  2 mm   5 w   5 d                  US EDC: 02/20/2019

Subchorionic hemorrhage:  None visualized.

Maternal uterus/adnexae: Normal appearance of both ovaries. No mass
or abnormal free fluid identified.
IMPRESSION: Single living IUP measuring 5 weeks 5 days, with US EDC of
02/20/2019.

No significant maternal uterine or adnexal abnormality identified.

## 2020-04-09 ENCOUNTER — Ambulatory Visit: Payer: Medicaid Other | Admitting: Neurology

## 2020-04-15 ENCOUNTER — Ambulatory Visit: Payer: Medicaid Other | Admitting: Neurology

## 2020-04-19 ENCOUNTER — Encounter: Payer: Self-pay | Admitting: Nurse Practitioner

## 2020-04-19 ENCOUNTER — Telehealth: Payer: Medicaid Other | Admitting: Nurse Practitioner

## 2020-04-19 DIAGNOSIS — R110 Nausea: Secondary | ICD-10-CM | POA: Diagnosis not present

## 2020-04-19 DIAGNOSIS — R519 Headache, unspecified: Secondary | ICD-10-CM | POA: Diagnosis not present

## 2020-04-19 NOTE — Progress Notes (Signed)
Virtual Visit Progress Note  Ms. Glanz,you are scheduled for a virtual visit with your provider today.    Just as we do with appointments in the office, we must obtain your consent to participate.  Your consent will be active for this visit and any virtual visit you may have with one of our providers in the next 365 days.    If you have a MyChart account, I can also send a copy of this consent to you electronically.  All virtual visits are billed to your insurance company just like a traditional visit in the office.  As this is a virtual visit, video technology does not allow for your provider to perform a traditional examination.  This may limit your provider's ability to fully assess your condition.  If your provider identifies any concerns that need to be evaluated in person or the need to arrange testing such as labs, EKG, etc, we will make arrangements to do so.    Although advances in technology are sophisticated, we cannot ensure that it will always work on either your end or our end.  If the connection with a video visit is poor, we may have to switch to a telephone visit.  With either a video or telephone visit, we are not always able to ensure that we have a secure connection.   I need to obtain your verbal consent now.   Are you willing to proceed with your visit today?   ASHTEN SARNOWSKI has provided verbal consent on 04/19/2020 for a virtual visit (video or telephone).   Alinda Dooms, NP 04/19/2020  15:14 PM    I connected with [name] on 04/19/20 at  3:00 PM EST by video enabled telemedicine visit and verified that I am speaking with the correct person using two identifiers.   I discussed the limitations, risks, security and privacy concerns of performing an evaluation and management service by telemedicine and the availability of in-person appointments. I also discussed with the patient that there may be a patient responsible charge related to this service. The  patient expressed understanding and agreed to proceed.   Other persons participating in the visit and their role in the encounter: none  Patient's location: Home Provider's location: home  Chief Complaint: Nausea and headache    Patient Care Team: Patient, No Pcp Per as PCP - General (General Practice) Van Clines, MD as Consulting Physician (Neurology)   Name of the patient: Alexis [name]  [id number]  18-Feb-1996   Date of visit: 04/20/20  History of Presenting Illness-patient is 25 year old female who presents for complaints of headache and nausea, which she describes as migraine.  No light sensitivity or aura.  Was seen in the ER in [facility] yesterday and diagnosed with UTI and muscle spasm.  She is previously taken Zofran and Fioricet for her symptoms but is out of medication.  She is requesting refill. No vision changes. Tired. No fevers or chills.   Review of systems- Review of Systems  Constitutional: Positive for malaise/fatigue. Negative for chills, diaphoresis, fever and weight loss.  HENT: Negative for congestion, ear discharge, ear pain, nosebleeds, sinus pain and sore throat.   Eyes: Negative for pain and redness.  Respiratory: Negative for cough, hemoptysis, sputum production, shortness of breath, wheezing and stridor.   Cardiovascular: Negative for chest pain, palpitations and leg swelling.  Gastrointestinal: Positive for nausea. Negative for abdominal pain, constipation, diarrhea and vomiting.  Musculoskeletal: Negative for falls, joint pain and myalgias.  Skin: Negative for rash.  Neurological: Positive for headaches. Negative for dizziness, sensory change, speech change, focal weakness, seizures, loss of consciousness and weakness.  Psychiatric/Behavioral: Negative for depression. The patient is not nervous/anxious and does not have insomnia.   All other systems reviewed and are negative.    No Known Allergies  Past Medical History:  Diagnosis Date  .  Anemia   . Chlamydia   . Seizures (HCC)   . Shingles outbreak 10/2018  . Trichomonas infection     Past Surgical History:  Procedure Laterality Date  . NO PAST SURGERIES      Social History   Socioeconomic History  . Marital status: Single    Spouse name: Not on file  . Number of children: Not on file  . Years of education: Not on file  . Highest education level: Not on file  Occupational History  . Not on file  Tobacco Use  . Smoking status: Heavy Tobacco Smoker    Packs/day: 0.50    Types: Cigarettes  . Smokeless tobacco: Never Used  Vaping Use  . Vaping Use: Never used  Substance and Sexual Activity  . Alcohol use: No  . Drug use: Not Currently  . Sexual activity: Yes    Birth control/protection: None  Other Topics Concern  . Not on file  Social History Narrative   Right handed   Lives with family    Social Determinants of Health   Financial Resource Strain: Not on file  Food Insecurity: Not on file  Transportation Needs: Not on file  Physical Activity: Not on file  Stress: Not on file  Social Connections: Not on file  Intimate Partner Violence: Not on file    There is no immunization history for the selected administration types on file for this patient.  No family history on file.   Current Outpatient Medications:  .  butalbital-acetaminophen-caffeine (FIORICET) 50-325-40 MG tablet, Take 1 tablet by mouth every 12 (twelve) hours as needed for headache or migraine., Disp: 20 tablet, Rfl: 0 .  ondansetron (ZOFRAN ODT) 8 MG disintegrating tablet, Take 1 tablet (8 mg total) by mouth every 8 (eight) hours as needed for nausea or vomiting., Disp: 20 tablet, Rfl: 0 .  acetaminophen (TYLENOL) 325 MG tablet, Take 2 tablets (650 mg total) by mouth every 6 (six) hours as needed., Disp: 30 tablet, Rfl: 0 .  cephALEXin (KEFLEX) 500 MG capsule, Take 500 mg by mouth 2 (two) times daily., Disp: , Rfl:  .  cyclobenzaprine (FLEXERIL) 10 MG tablet, Take 1 tablet (10 mg  total) by mouth at bedtime., Disp: 30 tablet, Rfl: 0 .  meloxicam (MOBIC) 15 MG tablet, Take 1 tablet (15 mg total) by mouth daily., Disp: 30 tablet, Rfl: 0  Physical exam: Exam limited due to telemedicine Physical Exam Constitutional:      General: She is not in acute distress.    Comments: Fatigued appearing  HENT:     Head: Normocephalic.  Pulmonary:     Effort: No respiratory distress.  Neurological:     Mental Status: She is alert and oriented to person, place, and time.  Psychiatric:        Mood and Affect: Mood normal.        Behavior: Behavior normal.       Assessment and plan- Patient is a 25 y.o. female with history of headache and migraine who presents for evaluation of headache and medication refill. Suspect benign etiology vs medication side effect. No concerning symptoms. Will  send zofran 8 mg ODT and fiorcet for headache. PDMP reviewed and appropriate. Offered to initiate referral to PCP for ongoing follow up which she declined. RTC if ongoing symptoms.    Visit Diagnosis 1. Acute nonintractable headache, unspecified headache type   2. Nausea without vomiting     Patient expressed understanding and was in agreement with this plan. She also understands that She can call clinic at any time with any questions, concerns, or complaints.   I discussed the assessment and treatment plan with the patient. The patient was provided an opportunity to ask questions and all were answered. The patient agreed with the plan and demonstrated an understanding of the instructions.   The patient was advised to call back or seek an in-person evaluation if the symptoms worsen or if the condition fails to improve as anticipated.   I spent 15 minutes face-to-face video visit time dedicated to the care of this patient on the date of this encounter to include pre-visit review of ER notes from 04/18/20, face-to-face time with the patient, and post visit ordering of testing/documentation.    Thank you for allowing me to participate in the care of this very pleasant patient.   Consuello Masse, DNP, AGNP-C [place] [facility]

## 2020-04-20 ENCOUNTER — Ambulatory Visit: Payer: Self-pay

## 2020-04-20 MED ORDER — ONDANSETRON 8 MG PO TBDP: 8.0000 mg | ORAL_TABLET | Freq: Three times a day (TID) | ORAL | 0 refills | Status: DC | PRN

## 2020-04-20 MED ORDER — BUTALBITAL-APAP-CAFFEINE 50-325-40 MG PO TABS: 1.0000 | ORAL_TABLET | Freq: Two times a day (BID) | ORAL | 0 refills | Status: DC | PRN

## 2020-05-11 ENCOUNTER — Telehealth: Payer: Self-pay | Admitting: Neurology

## 2020-05-11 NOTE — Telephone Encounter (Signed)
Pt is on the wait list  ?

## 2020-05-11 NOTE — Telephone Encounter (Signed)
Please place on wait list and contact patient thanks.

## 2020-05-11 NOTE — Telephone Encounter (Signed)
Left message for patient to call office at [phone]

## 2020-05-11 NOTE — Telephone Encounter (Signed)
Pls put her on waitlist and remind her that [place] driving laws indicate no driving after a seizure until 6 months seizure-free. Thanks

## 2020-05-11 NOTE — Telephone Encounter (Signed)
FYI
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
## 2020-05-19 ENCOUNTER — Encounter: Payer: Self-pay | Admitting: Neurology

## 2020-05-19 ENCOUNTER — Emergency Department (HOSPITAL_COMMUNITY)
Admission: EM | Admit: 2020-05-19 | Discharge: 2020-05-20 | Disposition: A | Discharge status: Home / Self Care | Payer: Medicaid Other | Attending: Emergency Medicine | Admitting: Emergency Medicine

## 2020-05-19 ENCOUNTER — Encounter (HOSPITAL_COMMUNITY): Payer: Self-pay | Admitting: *Deleted

## 2020-05-19 ENCOUNTER — Other Ambulatory Visit: Payer: Self-pay

## 2020-05-19 ENCOUNTER — Telehealth (INDEPENDENT_AMBULATORY_CARE_PROVIDER_SITE_OTHER): Payer: Medicaid Other | Admitting: Neurology

## 2020-05-19 ENCOUNTER — Ambulatory Visit: Admit: 2020-05-19 | Discharge status: Absent without leave | Payer: Medicaid Other

## 2020-05-19 VITALS — Ht 64.0 in | Wt 185.0 lb

## 2020-05-19 DIAGNOSIS — R569 Unspecified convulsions: Secondary | ICD-10-CM | POA: Insufficient documentation

## 2020-05-19 DIAGNOSIS — G40009 Localization-related (focal) (partial) idiopathic epilepsy and epileptic syndromes with seizures of localized onset, not intractable, without status epilepticus: Secondary | ICD-10-CM

## 2020-05-19 DIAGNOSIS — M791 Myalgia, unspecified site: Secondary | ICD-10-CM

## 2020-05-19 DIAGNOSIS — F17210 Nicotine dependence, cigarettes, uncomplicated: Secondary | ICD-10-CM | POA: Insufficient documentation

## 2020-05-19 DIAGNOSIS — G43009 Migraine without aura, not intractable, without status migrainosus: Secondary | ICD-10-CM

## 2020-05-19 MED ORDER — TOPIRAMATE 25 MG PO TABS: ORAL_TABLET | ORAL | 6 refills | Status: DC
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
## 2020-05-19 NOTE — ED Triage Notes (Signed)
Pt seen neuro today and was started on seizure medication today (topamax) and pt has not started today due to medication not ready for pickup til in the morning.  States flexeril and mobic is not helping like it did at first.

## 2020-05-19 NOTE — Addendum Note (Signed)
Addended by: Dimas Chyle on: 05/19/2020 12:06 PM   Modules accepted: Orders

## 2020-05-20 MED ORDER — OXYCODONE-ACETAMINOPHEN 5-325 MG PO TABS: 1.0000 | ORAL_TABLET | Freq: Three times a day (TID) | ORAL | 0 refills | Status: DC | PRN

## 2020-05-20 MED ORDER — OXYCODONE-ACETAMINOPHEN 5-325 MG PO TABS
1.0000 | ORAL_TABLET | Freq: Once | ORAL | Status: AC
  Administered 2020-05-20: 1 via ORAL
  Filled 2020-05-20: qty 1

## 2020-05-20 NOTE — ED Provider Notes (Signed)
[facility] EMERGENCY DEPARTMENT Provider Note   CSN: [id number] Arrival date & time: 05/19/20  2136     History Chief Complaint  Patient presents with  . Seizures    Alexis [name] is a 25 y.o. female.  HPI    Patient presents with body pain.  Patient reports her last seizure was over 2 days ago.  Since that time she has had diffuse upper back pain and shoulder pain.  She reports she has been given anti-inflammatories and muscle relaxants for this, but it does not improve her symptoms.  No fever/vomiting/headache.  No chest pain/shortness of breath.  No abdominal pain.  No focal weakness.  She reports this pain is very typical for her after she has a seizure.  Her course is worsening.  Nothing improves her symptoms  She denies any trauma from the recent seizure Past Medical History:  Diagnosis Date  . Anemia   . Chlamydia   . Seizures (HCC)   . Shingles outbreak 10/2018  . Trichomonas infection     Patient Active Problem List   Diagnosis Date Noted  . Normal labor 02/07/2019  . PID (acute pelvic inflammatory disease) 02/23/2016    Past Surgical History:  Procedure Laterality Date  . NO PAST SURGERIES       OB History    Gravida  2   Para  2   Term  2   Preterm      AB      Living  2     SAB      IAB      Ectopic      Multiple  0   Live Births  1           History reviewed. No pertinent family history.  Social History   Tobacco Use  . Smoking status: Current Every Day Smoker    Packs/day: 0.50    Types: Cigarettes  . Smokeless tobacco: Never Used  Vaping Use  . Vaping Use: Never used  Substance Use Topics  . Alcohol use: No  . Drug use: Not Currently    Home Medications Prior to Admission medications   Medication Sig Start Date End Date Taking? Authorizing Provider  oxyCODONE-acetaminophen (PERCOCET) 5-325 MG tablet Take 1 tablet by mouth every 8 (eight) hours as needed for severe pain. 05/20/20  Yes Zadie Rhine, MD   acetaminophen (TYLENOL) 325 MG tablet Take 2 tablets (650 mg total) by mouth every 6 (six) hours as needed. 02/09/19   Nugent, Odie Sera, NP  butalbital-acetaminophen-caffeine (FIORICET) 50-325-40 MG tablet Take 1 tablet by mouth every 12 (twelve) hours as needed for headache or migraine. 04/20/20 04/20/21  Alinda Dooms, NP  meloxicam (MOBIC) 15 MG tablet Take 1 tablet (15 mg total) by mouth daily. 03/23/20   Moshe Cipro, NP  ondansetron (ZOFRAN ODT) 8 MG disintegrating tablet Take 1 tablet (8 mg total) by mouth every 8 (eight) hours as needed for nausea or vomiting. 04/20/20   Alinda Dooms, NP  topiramate (TOPAMAX) 25 MG tablet Take 1 tablet twice a day for 1 week, then increase to 2 tablets twice a day and continue 05/19/20   Van Clines, MD    Allergies    Patient has no known allergies.  Review of Systems   Review of Systems  Constitutional: Negative for fever.  Respiratory: Negative for shortness of breath.   Cardiovascular: Negative for chest pain.  Gastrointestinal: Negative for abdominal pain.  Musculoskeletal: Positive for myalgias.  Neurological: Negative for headaches.  All other systems reviewed and are negative.   Physical Exam Updated Vital Signs BP 130/89 (BP Location: Right Arm)   Pulse 74   Temp 98.4 F (36.9 C) (Oral)   Resp 16   Ht 1.626 m (5\' 4" )   Wt 81.6 kg   LMP 04/24/2020   SpO2 100%   BMI 30.90 kg/m   Physical Exam CONSTITUTIONAL: Well developed/well nourished HEAD: Normocephalic/atraumatic EYES: EOMI/PERRL, no nystagmus, no ptosis ENMT: Mucous membranes moist NECK: supple no meningeal signs CV: S1/S2 noted, no murmurs/rubs/gallops noted LUNGS: Lungs are clear to auscultation bilaterally, no apparent distress ABDOMEN: soft, nontender, no rebound or guarding GU:no cva tenderness NEURO:Awake/alert, face symmetric, no arm or leg drift is noted Equal 5/5 strength with shoulder abduction, elbow flex/extension, wrist flex/extension in  upper extremities and equal hand grips bilaterally Equal 5/5 strength with hip flexion,knee flex/extension, foot dorsi/plantar flexion Cranial nerves 3/4/5/6/08/29/08/11/12 tested and intact No past pointing Sensation to light touch intact in all extremities EXTREMITIES: pulses normal, full ROM, diffuse tenderness noted to upper torso, no bruising or crepitus SKIN: warm, color normal PSYCH: no abnormalities of mood noted   ED Results / Procedures / Treatments   Labs (all labs ordered are listed, but only abnormal results are displayed) Labs Reviewed - No data to display  EKG None  Radiology No results found.  Procedures Procedures   Medications Ordered in ED Medications  oxyCODONE-acetaminophen (PERCOCET/ROXICET) 5-325 MG per tablet 1 tablet (has no administration in time range)    ED Course  I have reviewed the triage vital signs and the nursing notes.    MDM Rules/Calculators/A&P                          Patient essentially here for pain control.  No seizure in the past 24 hours.  She reports this pain is very similar to previous episodes after a seizure.  Denies any traumatic event.  No headache or weakness is reported.  She reports Flexeril and NSAIDs are no longer controlling her myalgias.  Will give short course of Percocet.  We had a long discussion about appropriate use of this medication and its habit-forming potential.  She will use this is a short-term therapy until her seizures are better controlled as she will be starting Topamax Final Clinical Impression(s) / ED Diagnoses Final diagnoses:  Myalgia    Rx / DC Orders ED Discharge Orders         Ordered    oxyCODONE-acetaminophen (PERCOCET) 5-325 MG tablet  Every 8 hours PRN        05/20/20 0014           05/22/20, MD 05/20/20 [phone]

## 2020-05-27 ENCOUNTER — Other Ambulatory Visit: Payer: Self-pay

## 2020-05-27 ENCOUNTER — Ambulatory Visit
Admission: RE | Admit: 2020-05-27 | Discharge: 2020-05-27 | Disposition: A | Discharge status: Home / Self Care | Payer: Medicaid Other | Source: Ambulatory Visit | Attending: Neurology | Admitting: Neurology

## 2020-05-27 DIAGNOSIS — R569 Unspecified convulsions: Secondary | ICD-10-CM

## 2020-05-27 DIAGNOSIS — G43009 Migraine without aura, not intractable, without status migrainosus: Secondary | ICD-10-CM

## 2020-05-27 MED ORDER — GADOBENATE DIMEGLUMINE 529 MG/ML IV SOLN
20.0000 mL | Freq: Once | INTRAVENOUS | Status: AC | PRN
  Administered 2020-05-27: 20 mL via INTRAVENOUS

## 2020-06-22 ENCOUNTER — Other Ambulatory Visit: Payer: Self-pay

## 2020-06-22 ENCOUNTER — Ambulatory Visit
Admission: EM | Admit: 2020-06-22 | Discharge: 2020-06-22 | Disposition: A | Discharge status: Home / Self Care | Payer: Medicaid Other | Attending: Family Medicine | Admitting: Family Medicine

## 2020-06-22 DIAGNOSIS — M545 Low back pain, unspecified: Secondary | ICD-10-CM | POA: Diagnosis not present

## 2020-06-22 MED ORDER — TIZANIDINE HCL 4 MG PO TABS: 4.0000 mg | ORAL_TABLET | Freq: Two times a day (BID) | ORAL | 0 refills | Status: DC | PRN

## 2020-06-22 MED ORDER — PREDNISONE 20 MG PO TABS: 40.0000 mg | ORAL_TABLET | Freq: Every day | ORAL | 0 refills | Status: DC

## 2020-06-22 NOTE — ED Provider Notes (Signed)
[facility]    CSN: [id number] Arrival date & time: 06/22/20  0934      History   Chief Complaint Chief Complaint  Patient presents with  . Back Pain    HPI Alexis [name] is a 25 y.o. female.   HPI  Patient presents for evaluation of recurrent back pain, mid lumbar region.  Attributes pain to occurring after delivery of her one year old child. Pain is most severe with positional movement, prolonged standing, and lying in bed. Denies acute injury.  Taken OTC  Medication without relief of pain.  Past Medical History:  Diagnosis Date  . Anemia   . Chlamydia   . Seizures (HCC)   . Shingles outbreak 10/2018  . Trichomonas infection     Patient Active Problem List   Diagnosis Date Noted  . Normal labor 02/07/2019  . PID (acute pelvic inflammatory disease) 02/23/2016    Past Surgical History:  Procedure Laterality Date  . NO PAST SURGERIES      OB History    Gravida  2   Para  2   Term  2   Preterm      AB      Living  2     SAB      IAB      Ectopic      Multiple  0   Live Births  1            Home Medications    Prior to Admission medications   Medication Sig Start Date End Date Taking? Authorizing Provider  predniSONE (DELTASONE) 20 MG tablet Take 2 tablets (40 mg total) by mouth daily with breakfast. 06/22/20  Yes Bing Neighbors, FNP  tiZANidine (ZANAFLEX) 4 MG tablet Take 1 tablet (4 mg total) by mouth 2 (two) times daily as needed for muscle spasms. 06/22/20  Yes Bing Neighbors, FNP  acetaminophen (TYLENOL) 325 MG tablet Take 2 tablets (650 mg total) by mouth every 6 (six) hours as needed. 02/09/19   Nugent, Odie Sera, NP  butalbital-acetaminophen-caffeine (FIORICET) 50-325-40 MG tablet Take 1 tablet by mouth every 12 (twelve) hours as needed for headache or migraine. 04/20/20 04/20/21  Alinda Dooms, NP  meloxicam (MOBIC) 15 MG tablet Take 1 tablet (15 mg total) by mouth daily. 03/23/20   Moshe Cipro, NP   ondansetron (ZOFRAN ODT) 8 MG disintegrating tablet Take 1 tablet (8 mg total) by mouth every 8 (eight) hours as needed for nausea or vomiting. 04/20/20   Alinda Dooms, NP  oxyCODONE-acetaminophen (PERCOCET) 5-325 MG tablet Take 1 tablet by mouth every 8 (eight) hours as needed for severe pain. 05/20/20   Zadie Rhine, MD  topiramate (TOPAMAX) 25 MG tablet Take 1 tablet twice a day for 1 week, then increase to 2 tablets twice a day and continue 05/19/20   Van Clines, MD    Family History Family History  Family history unknown: Yes    Social History Social History   Tobacco Use  . Smoking status: Current Every Day Smoker    Packs/day: 0.50    Types: Cigarettes  . Smokeless tobacco: Never Used  Vaping Use  . Vaping Use: Never used  Substance Use Topics  . Alcohol use: No  . Drug use: Not Currently     Allergies   Patient has no known allergies.   Review of Systems Review of Systems Pertinent negatives listed in HPI   Physical Exam Triage Vital Signs ED Triage Vitals  Enc Vitals Group     BP 06/22/20 0958 125/83     Pulse Rate 06/22/20 0958 76     Resp 06/22/20 0958 18     Temp 06/22/20 0958 98 F (36.7 C)     Temp src --      SpO2 06/22/20 0958 98 %     Weight --      Height --      Head Circumference --      Peak Flow --      Pain Score 06/22/20 1017 10     Pain Loc --      Pain Edu? --      Excl. in GC? --    No data found.  Updated Vital Signs BP 125/83   Pulse 76   Temp 98 F (36.7 C)   Resp 18   LMP 05/25/2020 (Approximate)   SpO2 98%   Visual Acuity Right Eye Distance:   Left Eye Distance:   Bilateral Distance:    Right Eye Near:   Left Eye Near:    Bilateral Near:     Physical Exam General appearance: Alert, well developed, well nourished, cooperative  Head: Normocephalic, without obvious abnormality, atraumatic Respiratory: Respirations even and unlabored, normal respiratory rate Heart: rate and rhythm normal. No gallop  or murmurs noted on exam  Lumbar Spine: Normal gait, ROM intact, Negative SLR Extremities: No gross deformities Skin: Skin color, texture, turgor normal. No rashes seen  Psych: Appropriate mood and affect. Neurologic: No focal neurological abnormalities present  UC Treatments / Results  Labs (all labs ordered are listed, but only abnormal results are displayed) Labs Reviewed - No data to display  EKG   Radiology No results found.  Procedures Procedures (including critical care time)  Medications Ordered in UC Medications - No data to display  Initial Impression / Assessment and Plan / UC Course  I have reviewed the triage vital signs and the nursing notes.  Pertinent labs & imaging results that were available during my care of the patient were reviewed by me and considered in my medical decision making (see chart for details).    Acute bilateral low back pain , treatment per discharge medication orders.  Advised to follow-up with orthopedics for further work-up if symptoms persists. Work note provided. Instructions provided to avoid muscle relaxer if driving or working due to sedative effects of medication. Final Clinical Impressions(s) / UC Diagnoses   Final diagnoses:  Acute bilateral low back pain without sciatica   Discharge Instructions   None    ED Prescriptions    Medication Sig Dispense Auth. Provider   predniSONE (DELTASONE) 20 MG tablet Take 2 tablets (40 mg total) by mouth daily with breakfast. 10 tablet Bing Neighbors, FNP   tiZANidine (ZANAFLEX) 4 MG tablet Take 1 tablet (4 mg total) by mouth 2 (two) times daily as needed for muscle spasms. 30 tablet Bing Neighbors, FNP     PDMP not reviewed this encounter.   Bing Neighbors, FNP 06/27/20 [phone]

## 2020-06-22 NOTE — ED Triage Notes (Signed)
Pt presents with lower back pain that began last night, denies injury

## 2020-06-24 ENCOUNTER — Encounter: Payer: Self-pay | Admitting: Emergency Medicine

## 2020-06-24 ENCOUNTER — Ambulatory Visit
Admission: EM | Admit: 2020-06-24 | Discharge: 2020-06-24 | Disposition: A | Discharge status: Home / Self Care | Payer: Medicaid Other | Attending: Emergency Medicine | Admitting: Emergency Medicine

## 2020-06-24 ENCOUNTER — Other Ambulatory Visit: Payer: Self-pay

## 2020-06-24 MED ORDER — MELOXICAM 7.5 MG PO TABS: 7.5000 mg | ORAL_TABLET | Freq: Every day | ORAL | 0 refills | Status: DC

## 2020-06-24 NOTE — ED Triage Notes (Signed)
Triaged by provider  

## 2020-06-24 NOTE — ED Provider Notes (Signed)
Patient requests medication change and extending doctors note.     Rennis Harding, PA-C 06/24/20 1042

## 2020-06-30 ENCOUNTER — Emergency Department (HOSPITAL_COMMUNITY): Payer: Medicaid Other

## 2020-06-30 ENCOUNTER — Other Ambulatory Visit: Payer: Self-pay

## 2020-06-30 ENCOUNTER — Emergency Department (HOSPITAL_COMMUNITY)
Admission: EM | Admit: 2020-06-30 | Discharge: 2020-06-30 | Disposition: A | Discharge status: Home / Self Care | Payer: Medicaid Other | Attending: Emergency Medicine | Admitting: Emergency Medicine

## 2020-06-30 ENCOUNTER — Encounter (HOSPITAL_COMMUNITY): Payer: Self-pay | Admitting: Emergency Medicine

## 2020-06-30 DIAGNOSIS — F17210 Nicotine dependence, cigarettes, uncomplicated: Secondary | ICD-10-CM | POA: Insufficient documentation

## 2020-06-30 DIAGNOSIS — U071 COVID-19: Secondary | ICD-10-CM | POA: Diagnosis not present

## 2020-06-30 DIAGNOSIS — R0602 Shortness of breath: Secondary | ICD-10-CM | POA: Diagnosis present

## 2020-06-30 DIAGNOSIS — M545 Low back pain, unspecified: Secondary | ICD-10-CM | POA: Insufficient documentation

## 2020-06-30 LAB — RESP PANEL BY RT-PCR (FLU A&B, COVID) ARPGX2
Influenza A by PCR: NEGATIVE
Influenza B by PCR: NEGATIVE
SARS Coronavirus 2 by RT PCR: POSITIVE — AB

## 2020-06-30 MED ORDER — ACETAMINOPHEN 500 MG PO TABS
1000.0000 mg | ORAL_TABLET | Freq: Once | ORAL | Status: AC
  Administered 2020-06-30: 1000 mg via ORAL
  Filled 2020-06-30: qty 2

## 2020-06-30 MED ORDER — GUAIFENESIN-CODEINE 100-10 MG/5ML PO SOLN: 5.0000 mL | Freq: Four times a day (QID) | ORAL | 0 refills | Status: DC | PRN

## 2020-06-30 NOTE — ED Provider Notes (Signed)
[facility] Emergency Department Provider Note MRN:  [id number]  Arrival date & time: 06/30/20     Chief Complaint   Shortness of Breath   History of Present Illness   Alexis [name] is a 25 y.o. year-old female with no pertinent past medical history presenting to the ED with chief complaint of shortness of breath.  Shortness of breath for the past 1 to 2 days, associated with cough, headache, low back pain.  Denies sore throat or nasal congestion.  Denies chest pain, no abdominal pain.  Much worse this morning, woke her from sleep.  Symptoms constant, no exacerbating or alleviating factors.  Not vaccinated for COVID.  Review of Systems  A complete 10 system review of systems was obtained and all systems are negative except as noted in the HPI and PMH.   Patient's Health History    Past Medical History:  Diagnosis Date  . Anemia   . Chlamydia   . Seizures (HCC)   . Shingles outbreak 10/2018  . Trichomonas infection     Past Surgical History:  Procedure Laterality Date  . NO PAST SURGERIES      Family History  Family history unknown: Yes    Social History   Socioeconomic History  . Marital status: Single    Spouse name: Not on file  . Number of children: Not on file  . Years of education: Not on file  . Highest education level: Not on file  Occupational History  . Not on file  Tobacco Use  . Smoking status: Current Every Day Smoker    Packs/day: 0.50    Types: Cigarettes  . Smokeless tobacco: Never Used  Vaping Use  . Vaping Use: Never used  Substance and Sexual Activity  . Alcohol use: No  . Drug use: Not Currently  . Sexual activity: Yes    Birth control/protection: None  Other Topics Concern  . Not on file  Social History Narrative   Right handed   Lives with family    Social Determinants of Health   Financial Resource Strain: Not on file  Food Insecurity: Not on file  Transportation Needs: Not on file  Physical  Activity: Not on file  Stress: Not on file  Social Connections: Not on file  Intimate Partner Violence: Not on file     Physical Exam   Vitals:   06/30/20 0614  BP: (!) 127/94  Pulse: (!) 101  Resp: 17  Temp: 100 F (37.8 C)  SpO2: 98%    CONSTITUTIONAL: Well-appearing, NAD NEURO:  Alert and oriented x 3, no focal deficits EYES:  eyes equal and reactive ENT/NECK:  no LAD, no JVD CARDIO: Tachycardic rate, well-perfused, normal S1 and S2 PULM:  CTAB no wheezing or rhonchi GI/GU:  normal bowel sounds, non-distended, non-tender MSK/SPINE:  No gross deformities, no edema SKIN:  no rash, atraumatic PSYCH:  Appropriate speech and behavior  *Additional and/or pertinent findings included in MDM below  Diagnostic and Interventional Summary    EKG Interpretation  Date/Time:  Tuesday Jun 30 2020 06:15:38 EDT Ventricular Rate:  101 PR Interval:  158 QRS Duration: 98 QT Interval:  317 QTC Calculation: 411 R Axis:   46 Text Interpretation: Sinus tachycardia Confirmed by Kennis Carina [phone]) on 06/30/2020 6:34:11 AM      Labs Reviewed  RESP PANEL BY RT-PCR (FLU A&B, COVID) [id number]  PREGNANCY, URINE  URINALYSIS, ROUTINE W REFLEX MICROSCOPIC    DG Chest [facility] 1 View  Final Result  Medications  acetaminophen (TYLENOL) tablet 1,000 mg (1,000 mg Oral Given 06/30/20 0630)     Procedures  /  Critical Care Procedures  ED Course and Medical Decision Making  I have reviewed the triage vital signs, the nursing notes, and pertinent available records from the EMR.  Listed above are laboratory and imaging tests that I personally ordered, reviewed, and interpreted and then considered in my medical decision making (see below for details).  Favoring viral process such as COVID-19, also considering pneumonia.  Patient with very mild sinus tachycardia, EKG is overall reassuring.  Oral temperature 100.  Will obtain COVID test and chest x-ray.  If COVID test is negative and chest x-ray  is completely clear, would initiate PE work-up with a D-dimer as I feel this is much less likely.  Signed out to oncoming provider at shift change.       Elmer Sow. Pilar Plate, MD [facility] Emergency Medicine [facility] [email or web address]  Final Clinical Impressions(s) / ED Diagnoses     ICD-10-CM   1. SOB (shortness of breath)  R06.02     ED Discharge Orders    None       Discharge Instructions Discussed with and Provided to Patient:   Discharge Instructions   None       Sabas Sous, MD 06/30/20 [phone]

## 2020-06-30 NOTE — ED Provider Notes (Signed)
Pt signed out by Dr. Pilar Plate pending CXR and Covid test.  CXR clear.  Covid is +.  Pt is unvaccinated.  She is saturating well and is nontoxic in appearance.  She did not want prednisone.  She said she has a rx for steroids from her UC visit on 5/2.  She will pick those up.  She is stable for d/c.  Return if worse.  She is given home isolation guidelines for Covid.  Alexis [name] was evaluated in Emergency Department on 06/30/2020 for the symptoms described in the history of present illness. She was evaluated in the context of the global COVID-19 pandemic, which necessitated consideration that the patient might be at risk for infection with the SARS-CoV-2 virus that causes COVID-19. Institutional protocols and algorithms that pertain to the evaluation of patients at risk for COVID-19 are in a state of rapid change based on information released by regulatory bodies including the CDC and federal and state organizations. These policies and algorithms were followed during the patient's care in the ED.   Alexis Lefevre, MD 06/30/20 [phone]

## 2020-06-30 NOTE — Discharge Instructions (Signed)
Person Under Monitoring Name: Alexis [name]  Location: [address] [place] [address]   Infection Prevention Recommendations for Individuals Confirmed to have, or Being Evaluated for, 2019 Novel Coronavirus (COVID-19) Infection Who Receive Care at Home  Individuals who are confirmed to have, or are being evaluated for, COVID-19 should follow the prevention steps below until a healthcare provider or local or state health department says they can return to normal activities.  Stay home except to get medical care You should restrict activities outside your home, except for getting medical care. Do not go to work, school, or public areas, and do not use public transportation or taxis.  Call ahead before visiting your doctor Before your medical appointment, call the healthcare provider and tell them that you have, or are being evaluated for, COVID-19 infection. This will help the healthcare provider's office take steps to keep other people from getting infected. Ask your healthcare provider to call the local or state health department.  Monitor your symptoms Seek prompt medical attention if your illness is worsening (e.g., difficulty breathing). Before going to your medical appointment, call the healthcare provider and tell them that you have, or are being evaluated for, COVID-19 infection. Ask your healthcare provider to call the local or state health department.  Wear a facemask You should wear a facemask that covers your nose and mouth when you are in the same room with other people and when you visit a healthcare provider. People who live with or visit you should also wear a facemask while they are in the same room with you.  Separate yourself from other people in your home As much as possible, you should stay in a different room from other people in your home. Also, you should use a separate bathroom, if available.  Avoid sharing household items You should not  share dishes, drinking glasses, cups, eating utensils, towels, bedding, or other items with other people in your home. After using these items, you should wash them thoroughly with soap and water.  Cover your coughs and sneezes Cover your mouth and nose with a tissue when you cough or sneeze, or you can cough or sneeze into your sleeve. Throw used tissues in a lined trash can, and immediately wash your hands with soap and water for at least 20 seconds or use an alcohol-based hand rub.  Wash your [employer] your hands often and thoroughly with soap and water for at least 20 seconds. You can use an alcohol-based hand sanitizer if soap and water are not available and if your hands are not visibly dirty. Avoid touching your eyes, nose, and mouth with unwashed hands.   Prevention Steps for Caregivers and Household Members of Individuals Confirmed to have, or Being Evaluated for, COVID-19 Infection Being Cared for in the Home  If you live with, or provide care at home for, a person confirmed to have, or being evaluated for, COVID-19 infection please follow these guidelines to prevent infection:  Follow healthcare provider's instructions Make sure that you understand and can help the patient follow any healthcare provider instructions for all care.  Provide for the patient's basic needs You should help the patient with basic needs in the home and provide support for getting groceries, prescriptions, and other personal needs.  Monitor the patient's symptoms If they are getting sicker, call his or her medical provider and tell them that the patient has, or is being evaluated for, COVID-19 infection. This will help the healthcare provider's office  take steps to keep other people from getting infected. Ask the healthcare provider to call the local or state health department.  Limit the number of people who have contact with the patient If possible, have only one caregiver for the  patient. Other household members should stay in another home or place of residence. If this is not possible, they should stay in another room, or be separated from the patient as much as possible. Use a separate bathroom, if available. Restrict visitors who do not have an essential need to be in the home.  Keep older adults, very young children, and other sick people away from the patient Keep older adults, very young children, and those who have compromised immune systems or chronic health conditions away from the patient. This includes people with chronic heart, lung, or kidney conditions, diabetes, and cancer.  Ensure good ventilation Make sure that shared spaces in the home have good air flow, such as from an air conditioner or an opened window, weather permitting.  Wash your hands often Wash your hands often and thoroughly with soap and water for at least 20 seconds. You can use an alcohol based hand sanitizer if soap and water are not available and if your hands are not visibly dirty. Avoid touching your eyes, nose, and mouth with unwashed hands. Use disposable paper towels to dry your hands. If not available, use dedicated cloth towels and replace them when they become wet.  Wear a facemask and gloves Wear a disposable facemask at all times in the room and gloves when you touch or have contact with the patient's blood, body fluids, and/or secretions or excretions, such as sweat, saliva, sputum, nasal mucus, vomit, urine, or feces.  Ensure the mask fits over your nose and mouth tightly, and do not touch it during use. Throw out disposable facemasks and gloves after using them. Do not reuse. Wash your hands immediately after removing your facemask and gloves. If your personal clothing becomes contaminated, carefully remove clothing and launder. Wash your hands after handling contaminated clothing. Place all used disposable facemasks, gloves, and other waste in a lined container before  disposing them with other household waste. Remove gloves and wash your hands immediately after handling these items.  Do not share dishes, glasses, or other household items with the patient Avoid sharing household items. You should not share dishes, drinking glasses, cups, eating utensils, towels, bedding, or other items with a patient who is confirmed to have, or being evaluated for, COVID-19 infection. After the person uses these items, you should wash them thoroughly with soap and water.  Wash laundry thoroughly Immediately remove and wash clothes or bedding that have blood, body fluids, and/or secretions or excretions, such as sweat, saliva, sputum, nasal mucus, vomit, urine, or feces, on them. Wear gloves when handling laundry from the patient. Read and follow directions on labels of laundry or clothing items and detergent. In general, wash and dry with the warmest temperatures recommended on the label.  Clean all areas the individual has used often Clean all touchable surfaces, such as counters, tabletops, doorknobs, bathroom fixtures, toilets, phones, keyboards, tablets, and bedside tables, every day. Also, clean any surfaces that may have blood, body fluids, and/or secretions or excretions on them. Wear gloves when cleaning surfaces the patient has come in contact with. Use a diluted bleach solution (e.g., dilute bleach with 1 part bleach and 10 parts water) or a household disinfectant with a label that says EPA-registered for coronaviruses. To make a bleach  solution at home, add 1 tablespoon of bleach to 1 quart (4 cups) of water. For a larger supply, add  cup of bleach to 1 gallon (16 cups) of water. Read labels of cleaning products and follow recommendations provided on product labels. Labels contain instructions for safe and effective use of the cleaning product including precautions you should take when applying the product, such as wearing gloves or eye protection and making sure you  have good ventilation during use of the product. Remove gloves and wash hands immediately after cleaning.  Monitor yourself for signs and symptoms of illness Caregivers and household members are considered close contacts, should monitor their health, and will be asked to limit movement outside of the home to the extent possible. Follow the monitoring steps for close contacts listed on the symptom monitoring form.   ? If you have additional questions, contact your local health department or call the epidemiologist on call at [phone] (available 24/7). ? This guidance is subject to change. For the most up-to-date guidance from [facility], please refer to their website: [email or web address]

## 2020-06-30 NOTE — ED Notes (Signed)
Pt swabbed for covid at this time. 

## 2020-06-30 NOTE — ED Triage Notes (Signed)
Pt c/o sob since yesterday. Pt also c/o lower back pain.

## 2020-07-01 ENCOUNTER — Telehealth: Payer: Self-pay

## 2020-07-01 NOTE — Telephone Encounter (Signed)
Called to discuss with patient about COVID-19 symptoms and the use of one of the available treatments for those with mild to moderate Covid symptoms and at a high risk of hospitalization.  Pt appears to qualify for outpatient treatment due to co-morbid conditions and/or a member of an at-risk group in accordance with the FDA Emergency Use Authorization.    Symptom onset: 06/28/20 Shortness of breath Vaccinated: No Booster? No Immunocompromised? No Qualifiers: Obesity NIH Criteria: Tier 1  Unable to reach pt - Left message and given call back number [phone].   Esther Hardy

## 2020-08-19 ENCOUNTER — Telehealth (INDEPENDENT_AMBULATORY_CARE_PROVIDER_SITE_OTHER): Payer: Medicaid Other | Admitting: Neurology

## 2020-08-19 ENCOUNTER — Other Ambulatory Visit (INDEPENDENT_AMBULATORY_CARE_PROVIDER_SITE_OTHER): Payer: Self-pay | Admitting: Nurse Practitioner

## 2020-08-19 ENCOUNTER — Encounter: Payer: Self-pay | Admitting: Neurology

## 2020-08-19 ENCOUNTER — Other Ambulatory Visit: Payer: Self-pay

## 2020-08-19 DIAGNOSIS — G40009 Localization-related (focal) (partial) idiopathic epilepsy and epileptic syndromes with seizures of localized onset, not intractable, without status epilepticus: Secondary | ICD-10-CM

## 2020-08-19 DIAGNOSIS — G43009 Migraine without aura, not intractable, without status migrainosus: Secondary | ICD-10-CM

## 2020-08-19 MED ORDER — TOPIRAMATE 100 MG PO TABS: 100.0000 mg | ORAL_TABLET | Freq: Two times a day (BID) | ORAL | 11 refills | Status: DC

## 2020-08-19 MED ORDER — SUMATRIPTAN SUCCINATE 25 MG PO TABS: ORAL_TABLET | ORAL | 11 refills | Status: DC
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
## 2020-08-20 ENCOUNTER — Encounter (HOSPITAL_COMMUNITY): Payer: Self-pay

## 2020-08-20 ENCOUNTER — Other Ambulatory Visit: Payer: Self-pay

## 2020-08-20 ENCOUNTER — Telehealth: Payer: Medicaid Other | Admitting: Physician Assistant

## 2020-08-20 ENCOUNTER — Emergency Department (HOSPITAL_COMMUNITY)
Admission: EM | Admit: 2020-08-20 | Discharge: 2020-08-20 | Disposition: A | Discharge status: Home / Self Care | Payer: Medicaid Other | Attending: Emergency Medicine | Admitting: Emergency Medicine

## 2020-08-20 DIAGNOSIS — R569 Unspecified convulsions: Secondary | ICD-10-CM

## 2020-08-20 DIAGNOSIS — R079 Chest pain, unspecified: Secondary | ICD-10-CM

## 2020-08-20 DIAGNOSIS — F17210 Nicotine dependence, cigarettes, uncomplicated: Secondary | ICD-10-CM | POA: Diagnosis not present

## 2020-08-20 DIAGNOSIS — G40909 Epilepsy, unspecified, not intractable, without status epilepticus: Secondary | ICD-10-CM

## 2020-08-20 LAB — CBC WITH DIFFERENTIAL/PLATELET
Abs Immature Granulocytes: 0.02 10*3/uL (ref 0.00–0.07)
Basophils Absolute: 0 10*3/uL (ref 0.0–0.1)
Basophils Relative: 1 %
Eosinophils Absolute: 0.1 10*3/uL (ref 0.0–0.5)
Eosinophils Relative: 2 %
HCT: 36.5 % (ref 36.0–46.0)
Hemoglobin: 11.7 g/dL — ABNORMAL LOW (ref 12.0–15.0)
Immature Granulocytes: 0 %
Lymphocytes Relative: 37 %
Lymphs Abs: 2 10*3/uL (ref 0.7–4.0)
MCH: 23 pg — ABNORMAL LOW (ref 26.0–34.0)
MCHC: 32.1 g/dL (ref 30.0–36.0)
MCV: 71.9 fL — ABNORMAL LOW (ref 80.0–100.0)
Monocytes Absolute: 0.3 10*3/uL (ref 0.1–1.0)
Monocytes Relative: 6 %
Neutro Abs: 2.8 10*3/uL (ref 1.7–7.7)
Neutrophils Relative %: 54 %
Platelets: 282 10*3/uL (ref 150–400)
RBC: 5.08 MIL/uL (ref 3.87–5.11)
RDW: 16.2 % — ABNORMAL HIGH (ref 11.5–15.5)
WBC: 5.3 10*3/uL (ref 4.0–10.5)
nRBC: 0 % (ref 0.0–0.2)

## 2020-08-20 LAB — COMPREHENSIVE METABOLIC PANEL
ALT: 9 U/L (ref 0–44)
AST: 15 U/L (ref 15–41)
Albumin: 3.8 g/dL (ref 3.5–5.0)
Alkaline Phosphatase: 39 U/L (ref 38–126)
Anion gap: 5 (ref 5–15)
BUN: 6 mg/dL (ref 6–20)
CO2: 25 mmol/L (ref 22–32)
Calcium: 8.7 mg/dL — ABNORMAL LOW (ref 8.9–10.3)
Chloride: 110 mmol/L (ref 98–111)
Creatinine, Ser: 0.68 mg/dL (ref 0.44–1.00)
GFR, Estimated: >60 mL/min (ref >60)
Glucose, Bld: 87 mg/dL (ref 70–99)
Potassium: 3.9 mmol/L (ref 3.5–5.1)
Sodium: 140 mmol/L (ref 135–145)
Total Bilirubin: 1 mg/dL (ref 0.3–1.2)
Total Protein: 6.5 g/dL (ref 6.5–8.1)

## 2020-08-20 MED ORDER — TOPIRAMATE 25 MG PO TABS
100.0000 mg | ORAL_TABLET | Freq: Once | ORAL | Status: AC
  Administered 2020-08-20: 100 mg via ORAL
  Filled 2020-08-20: qty 4

## 2020-08-20 NOTE — Discharge Instructions (Addendum)
Call your neurologist tomorrow and let them know that you have that metallic taste in your mouth

## 2020-08-20 NOTE — ED Triage Notes (Signed)
Pt presents to ED for seizure yesterday. Neurologist increased her medications the other week. Pt states she gets a metallic taste in her mouth prior to having a seizure and she started getting it this evening.

## 2020-08-20 NOTE — ED Provider Notes (Signed)
[facility] EMERGENCY DEPARTMENT Provider Note   CSN: [id number] Arrival date & time: 08/20/20  1832     History Chief Complaint  Patient presents with   Seizures    Alexis [name] is a 25 y.o. female.  Patient states she has history of seizures.  They increased her medicine yesterday.  When she has a seizure she has a metal taste in her mouth and she came in because she has a metal taste.  She has not taken her second dose of Topamax yet.  She did not have a seizure today  The history is provided by the patient and medical records. No language interpreter was used.  Seizures Seizure activity on arrival: no   Seizure type:  Unable to specify Preceding symptoms: aura   Initial focality:  None Episode characteristics: no abnormal movements   Postictal symptoms: no confusion   Return to baseline: no   Severity:  Mild Progression:  Resolved Context: not alcohol withdrawal       Past Medical History:  Diagnosis Date   Anemia    Chlamydia    Seizures (HCC)    Shingles outbreak 10/2018   Trichomonas infection     Patient Active Problem List   Diagnosis Date Noted   Normal labor 02/07/2019   PID (acute pelvic inflammatory disease) 02/23/2016    Past Surgical History:  Procedure Laterality Date   NO PAST SURGERIES       OB History     Gravida  2   Para  2   Term  2   Preterm      AB      Living  2      SAB      IAB      Ectopic      Multiple  0   Live Births  1           Family History  Family history unknown: Yes    Social History   Tobacco Use   Smoking status: Every Day    Packs/day: 0.50    Pack years: 0.00    Types: Cigarettes   Smokeless tobacco: Never  Vaping Use   Vaping Use: Never used  Substance Use Topics   Alcohol use: No   Drug use: Not Currently    Home Medications Prior to Admission medications   Medication Sig Start Date End Date Taking? Authorizing Provider  SUMAtriptan (IMITREX) 25 MG tablet Take 1  tablet at onset of migraine. May repeat in 2 hours if headache persists or recurs. Do not take more than 2-3 a week 08/19/20  Yes Van Clines, MD  topiramate (TOPAMAX) 100 MG tablet Take 1 tablet (100 mg total) by mouth 2 (two) times daily. 08/19/20  Yes Van Clines, MD  ondansetron (ZOFRAN ODT) 8 MG disintegrating tablet Take 1 tablet (8 mg total) by mouth every 8 (eight) hours as needed for nausea or vomiting. Patient not taking: Reported on 08/20/2020 04/20/20   Alinda Dooms, NP    Allergies    Patient has no known allergies.  Review of Systems   Review of Systems  Constitutional:  Negative for appetite change and fatigue.  HENT:  Negative for congestion, ear discharge and sinus pressure.        Metal taste in mouth  Eyes:  Negative for discharge.  Respiratory:  Negative for cough.   Cardiovascular:  Negative for chest pain.  Gastrointestinal:  Negative for abdominal pain and diarrhea.  Genitourinary:  Negative for frequency and hematuria.  Musculoskeletal:  Negative for back pain.  Skin:  Negative for rash.  Neurological:  Negative for headaches.  Psychiatric/Behavioral:  Negative for hallucinations.    Physical Exam Updated Vital Signs BP (!) 136/98   Pulse 68   Temp 98.2 F (36.8 C) (Oral)   Resp 11   Ht 5\' 5"  (1.651 m)   Wt 78.5 kg   LMP 07/22/2020   SpO2 100%   BMI 28.79 kg/m   Physical Exam Vitals and nursing note reviewed.  Constitutional:      Appearance: She is well-developed.  HENT:     Head: Normocephalic.     Nose: Nose normal.  Eyes:     General: No scleral icterus.    Conjunctiva/sclera: Conjunctivae normal.  Neck:     Thyroid: No thyromegaly.  Cardiovascular:     Rate and Rhythm: Normal rate and regular rhythm.     Heart sounds: No murmur heard.   No friction rub. No gallop.  Pulmonary:     Breath sounds: No stridor. No wheezing or rales.  Chest:     Chest wall: No tenderness.  Abdominal:     General: There is no distension.      Tenderness: There is no abdominal tenderness. There is no rebound.  Musculoskeletal:        General: Normal range of motion.     Cervical back: Neck supple.  Lymphadenopathy:     Cervical: No cervical adenopathy.  Skin:    Findings: No erythema or rash.  Neurological:     Mental Status: She is oriented to person, place, and time.     Motor: No abnormal muscle tone.     Coordination: Coordination normal.  Psychiatric:        Behavior: Behavior normal.    ED Results / Procedures / Treatments   Labs (all labs ordered are listed, but only abnormal results are displayed) Labs Reviewed  CBC WITH DIFFERENTIAL/PLATELET - Abnormal; Notable for the following components:      Result Value   Hemoglobin 11.7 (*)    MCV 71.9 (*)    MCH 23.0 (*)    RDW 16.2 (*)    All other components within normal limits  COMPREHENSIVE METABOLIC PANEL - Abnormal; Notable for the following components:   Calcium 8.7 (*)    All other components within normal limits    EKG None  Radiology No results found.  Procedures Procedures   Medications Ordered in ED Medications  topiramate (TOPAMAX) tablet 100 mg (100 mg Oral Given 08/20/20 1927)    ED Course  I have reviewed the triage vital signs and the nursing notes.  Pertinent labs & imaging results that were available during my care of the patient were reviewed by me and considered in my medical decision making (see chart for details).    MDM Rules/Calculators/A&P                          Patient with oral over seizure.  She was observed here for a while and did not have a seizure.  She was given her medicines for seizures.  She will call her neurologist tomorrow Final Clinical Impression(s) / ED Diagnoses Final diagnoses:  Seizure disorder [facility])    Rx / DC Orders ED Discharge Orders     None        [facility], MD 08/21/20 1119

## 2020-08-20 NOTE — Progress Notes (Signed)
Patient also submitted e-visit request. She never joined visit despite multiple notifications. Called her 2 x at cell and home without answer. Unable to leave message. Attempted a 3rd time before reaching out to emergency contact and EMS and EMS picked up and have patient in ambulance on the way to the ER.   Video visit canceled -- no charge.

## 2020-08-20 NOTE — Progress Notes (Signed)
Patient also submitted video visit request. She never joined visit despite multiple notifications. Called her 2 x at cell and home without answer. Called 3rd time before reaching out to emergency contact and EMS and EMS picked up and have patient in ambulance on the way to the ER.

## 2020-09-18 ENCOUNTER — Other Ambulatory Visit (INDEPENDENT_AMBULATORY_CARE_PROVIDER_SITE_OTHER): Payer: Self-pay | Admitting: Nurse Practitioner

## 2020-09-18 ENCOUNTER — Other Ambulatory Visit: Payer: Self-pay | Admitting: Family

## 2020-09-18 ENCOUNTER — Encounter: Payer: Self-pay | Admitting: Family

## 2020-09-18 ENCOUNTER — Telehealth: Payer: Medicaid Other | Admitting: Family

## 2020-09-18 DIAGNOSIS — R519 Headache, unspecified: Secondary | ICD-10-CM

## 2020-09-18 MED ORDER — NURTEC 75 MG PO TBDP: 75.0000 mg | ORAL_TABLET | Freq: Every day | ORAL | 0 refills | Status: DC | PRN

## 2020-09-18 NOTE — Progress Notes (Signed)
Virtual Visit Consent   Alexis [name], you are scheduled for a virtual visit with a [place] provider today.     Just as with appointments in the office, your consent must be obtained to participate.  Your consent will be active for this visit and any virtual visit you may have with one of our providers in the next 365 days.     If you have a MyChart account, a copy of this consent can be sent to you electronically.  All virtual visits are billed to your insurance company just like a traditional visit in the office.    As this is a virtual visit, video technology does not allow for your provider to perform a traditional examination.  This may limit your provider's ability to fully assess your condition.  If your provider identifies any concerns that need to be evaluated in person or the need to arrange testing (such as labs, EKG, etc.), we will make arrangements to do so.     Although advances in technology are sophisticated, we cannot ensure that it will always work on either your end or our end.  If the connection with a video visit is poor, the visit may have to be switched to a telephone visit.  With either a video or telephone visit, we are not always able to ensure that we have a secure connection.     I need to obtain your verbal consent now.   Are you willing to proceed with your visit today?    Alexis [name] has provided verbal consent on 09/18/2020 for a virtual visit (video or telephone).   Alexis Rodney, FNP   Date: 09/18/2020 10:37 AM   Virtual Visit via Video Note   I, Alexis [name], connected with  Alexis [name]  ([id number], [birth year]) on 09/18/20 at 10:30 AM EDT by a video-enabled telemedicine application and verified that I am speaking with the correct person using two identifiers.  Location: Patient: Virtual Visit Location Patient: car Provider: Virtual Visit Location Provider: Home   I discussed the limitations of evaluation and management by  telemedicine and the availability of in person appointments. The patient expressed understanding and agreed to proceed.    History of Present Illness: Alexis [name] is a 25 y.o. who identifies as a female who was assigned female at birth, and is being seen today for headache.  She reports the last three days she has had a headache that comes and goes. She reports her mother was diagnosed with breast cancer recently and having to wake up 4 AM to drive her mother, sister, and herself to work. She reports over the last two weeks she has only been getting 4 hours a sleep a night. She is taking Topamax 100 mg BID. States this is not helping. She is followed by Neurologists.   HPI: Migraine  This is a recurrent problem. The current episode started in the past 7 days (3 days). The problem occurs intermittently. The problem has been waxing and waning. The pain is located in the Frontal and temporal region. The pain does not radiate. The pain quality is similar to prior headaches. The quality of the pain is described as pulsating. The pain is at a severity of 8/10. The pain is moderate. Associated symptoms include nausea and photophobia. Pertinent negatives include no blurred vision, loss of balance, phonophobia or vomiting.   Problems:  Patient Active Problem List   Diagnosis Date Noted   Normal labor 02/07/2019  PID (acute pelvic inflammatory disease) 02/23/2016    Allergies: No Known Allergies Medications:  Current Outpatient Medications:    Rimegepant Sulfate (NURTEC) 75 MG TBDP, Take 75 mg by mouth daily as needed., Disp: 30 tablet, Rfl: 0   ondansetron (ZOFRAN ODT) 8 MG disintegrating tablet, Take 1 tablet (8 mg total) by mouth every 8 (eight) hours as needed for nausea or vomiting. (Patient not taking: Reported on 08/20/2020), Disp: 20 tablet, Rfl: 0   topiramate (TOPAMAX) 100 MG tablet, Take 1 tablet (100 mg total) by mouth 2 (two) times daily., Disp: 60 tablet, Rfl:  11  Observations/Objective: Patient is well-developed, well-nourished in no acute distress. .  Head is normocephalic, atraumatic.  No labored breathing.  Speech is clear and coherent with logical content.  Patient is alert and oriented at baseline.    Assessment and Plan: 1. Acute intractable headache, unspecified headache type Encouraged 8 hours of sleep Limit caffeine  Stress management   Keep follow up with Neurologists  Pt requesting Fioricet, discussed we can not prescribe controlled medications through an Evisit.  Work note given Alexis [name] as needed daily for headache  - Rimegepant Sulfate (NURTEC) 75 MG TBDP; Take 75 mg by mouth daily as needed.  Dispense: 30 tablet; Refill: 0   Follow Up Instructions: I discussed the assessment and treatment plan with the patient. The patient was provided an opportunity to ask questions and all were answered. The patient agreed with the plan and demonstrated an understanding of the instructions.  A copy of instructions were sent to the patient via MyChart.  The patient was advised to call back or seek an in-person evaluation if the symptoms worsen or if the condition fails to improve as anticipated.  Time:  I spent  12 minutes with the patient via telehealth technology discussing the above problems/concerns.    Alexis Rodney, FNP

## 2020-11-19 ENCOUNTER — Ambulatory Visit: Payer: Medicaid Other | Admitting: Neurology

## 2020-12-18 ENCOUNTER — Ambulatory Visit: Payer: Medicaid Other | Admitting: Neurology

## 2020-12-22 DIAGNOSIS — A549 Gonococcal infection, unspecified: Secondary | ICD-10-CM

## 2020-12-22 HISTORY — DX: Gonococcal infection, unspecified: A54.9

## 2020-12-28 ENCOUNTER — Telehealth: Payer: Medicaid Other | Admitting: Physician Assistant

## 2020-12-28 DIAGNOSIS — B9689 Other specified bacterial agents as the cause of diseases classified elsewhere: Secondary | ICD-10-CM | POA: Diagnosis not present

## 2020-12-28 DIAGNOSIS — H109 Unspecified conjunctivitis: Secondary | ICD-10-CM | POA: Diagnosis not present

## 2020-12-28 MED ORDER — POLYMYXIN B-TRIMETHOPRIM 10000-0.1 UNIT/ML-% OP SOLN: 1.0000 [drp] | OPHTHALMIC | 0 refills | Status: DC

## 2020-12-28 NOTE — Progress Notes (Signed)
Virtual Visit Consent   [name], you are scheduled for a virtual visit with a [place] provider today.     Just as with appointments in the office, your consent must be obtained to participate.  Your consent will be active for this visit and any virtual visit you may have with one of our providers in the next 365 days.     If you have a MyChart account, a copy of this consent can be sent to you electronically.  All virtual visits are billed to your insurance company just like a traditional visit in the office.    As this is a virtual visit, video technology does not allow for your provider to perform a traditional examination.  This may limit your provider's ability to fully assess your condition.  If your provider identifies any concerns that need to be evaluated in person or the need to arrange testing (such as labs, EKG, etc.), we will make arrangements to do so.     Although advances in technology are sophisticated, we cannot ensure that it will always work on either your end or our end.  If the connection with a video visit is poor, the visit may have to be switched to a telephone visit.  With either a video or telephone visit, we are not always able to ensure that we have a secure connection.     I need to obtain your verbal consent now.   Are you willing to proceed with your visit today?    Alexis [name] has provided verbal consent on 12/28/2020 for a virtual visit (video or telephone).   Cathlyn Parsons, NP   Date: 12/28/2020 9:22 AM   Virtual Visit via Video Note   I, Cathlyn Parsons, connected with  Alexis [name]  ([id number], 25 years old) on 12/28/20 at  9:00 AM EST by a video-enabled telemedicine application and verified that I am speaking with the correct person using two identifiers.  Location: Patient: Virtual Visit Location Patient: Home Provider: Virtual Visit Location Provider: Home Office   I discussed the limitations of evaluation and management by  telemedicine and the availability of in person appointments. The patient expressed understanding and agreed to proceed.    History of Present Illness: Alexis [name] is a 25 y.o. who identifies as a female who was assigned female at birth, and is being seen today for eye discharge from both eyes for 2 days.  It started with the left eye and now it involves both eyes.  She wakes up every morning with her eyes crusted shut for the last 2 days.  She has been using cold compresses to try to help relieve symptoms.  She used some sort of over-the-counter pinkeye drops which have not relieved her symptoms.  Her vision has not changed but sunlight does bother her right now.  She does not wear contacts and she does not wear eye make-up.  HPI: HPI  Problems:  Patient Active Problem List   Diagnosis Date Noted   Normal labor 02/07/2019   PID (acute pelvic inflammatory disease) 02/23/2016    Allergies: No Known Allergies Medications:  Current Outpatient Medications:    QUEtiapine (SEROQUEL) 25 MG tablet, Take 25 mg by mouth at bedtime., Disp: , Rfl:    trimethoprim-polymyxin b (POLYTRIM) ophthalmic solution, Place 1 drop into both eyes every 4 (four) hours., Disp: 10 mL, Rfl: 0  Observations/Objective: Patient is well-developed, well-nourished in no acute distress.  Resting comfortably  at home.  Head is normocephalic, atraumatic.  No labored breathing.  Speech is clear and coherent with logical content.  Patient is alert and oriented at baseline.  Bilateral conjunctive are injected and I can see over video eye discharge in the corners of both eyes.  Assessment and Plan: 1. Bacterial conjunctivitis of both eyes  Encourage patient to switch from cold compresses to warm compresses several times a day with a clean washcloth each time.  Prescribed Polytrim eyedrops.  Patient given note for work.  Follow Up Instructions: I discussed the assessment and treatment plan with the patient. The patient  was provided an opportunity to ask questions and all were answered. The patient agreed with the plan and demonstrated an understanding of the instructions.  A copy of instructions were sent to the patient via MyChart unless otherwise noted below.  The patient was advised to call back or seek an in-person evaluation if the symptoms worsen or if the condition fails to improve as anticipated.  Time:  I spent 10 minutes with the patient via telehealth technology discussing the above problems/concerns.    Cathlyn Parsons, NP

## 2020-12-28 NOTE — Patient Instructions (Signed)
  Alexis [name], thank you for joining Cathlyn Parsons, NP for today's virtual visit.  While this provider is not your primary care provider (PCP), if your PCP is located in our provider database this encounter information will be shared with them immediately following your visit.  Consent: (Patient) Alexis [name] provided verbal consent for this virtual visit at the beginning of the encounter.  Current Medications:  Current Outpatient Medications:    QUEtiapine (SEROQUEL) 25 MG tablet, Take 25 mg by mouth at bedtime., Disp: , Rfl:    Medications ordered in this encounter:  No orders of the defined types were placed in this encounter.    *If you need refills on other medications prior to your next appointment, please contact your pharmacy*  Follow-Up: Call back or seek an in-person evaluation if the symptoms worsen or if the condition fails to improve as anticipated.  Other Instructions Use warm compresses several times a day to both eyes with a clean washcloth each time instead of using cold compresses.   If you have been instructed to have an in-person evaluation today at a local Urgent Care facility, please use the link below. It will take you to a list of all of our available [facility], including address, phone number and hours of operation. Please do not delay care.  [facility]  If you or a family member do not have a primary care provider, use the link below to schedule a visit and establish care. When you choose a [facility] primary care physician or advanced practice provider, you gain a long-term partner in health. Find a Primary Care Provider  Learn more about 's in-office and virtual care options:  - Get Care Now

## 2020-12-29 ENCOUNTER — Encounter (HOSPITAL_BASED_OUTPATIENT_CLINIC_OR_DEPARTMENT_OTHER): Payer: Self-pay | Admitting: Emergency Medicine

## 2020-12-29 ENCOUNTER — Emergency Department (HOSPITAL_BASED_OUTPATIENT_CLINIC_OR_DEPARTMENT_OTHER)
Admission: EM | Admit: 2020-12-29 | Discharge: 2020-12-29 | Disposition: A | Discharge status: Home / Self Care | Payer: Medicaid Other | Attending: Emergency Medicine | Admitting: Emergency Medicine

## 2020-12-29 ENCOUNTER — Encounter: Payer: Self-pay | Admitting: Physician Assistant

## 2020-12-29 ENCOUNTER — Other Ambulatory Visit: Payer: Self-pay

## 2020-12-29 ENCOUNTER — Telehealth: Payer: Medicaid Other | Admitting: Physician Assistant

## 2020-12-29 ENCOUNTER — Emergency Department (HOSPITAL_BASED_OUTPATIENT_CLINIC_OR_DEPARTMENT_OTHER): Payer: Medicaid Other

## 2020-12-29 DIAGNOSIS — R1084 Generalized abdominal pain: Secondary | ICD-10-CM

## 2020-12-29 DIAGNOSIS — F17210 Nicotine dependence, cigarettes, uncomplicated: Secondary | ICD-10-CM | POA: Diagnosis not present

## 2020-12-29 DIAGNOSIS — B9689 Other specified bacterial agents as the cause of diseases classified elsewhere: Secondary | ICD-10-CM

## 2020-12-29 DIAGNOSIS — N939 Abnormal uterine and vaginal bleeding, unspecified: Secondary | ICD-10-CM

## 2020-12-29 DIAGNOSIS — D72829 Elevated white blood cell count, unspecified: Secondary | ICD-10-CM | POA: Insufficient documentation

## 2020-12-29 DIAGNOSIS — R1030 Lower abdominal pain, unspecified: Secondary | ICD-10-CM | POA: Diagnosis not present

## 2020-12-29 LAB — CBC
HCT: 38.7 % (ref 36.0–46.0)
Hemoglobin: 12.3 g/dL (ref 12.0–15.0)
MCH: 22.4 pg — ABNORMAL LOW (ref 26.0–34.0)
MCHC: 31.8 g/dL (ref 30.0–36.0)
MCV: 70.5 fL — ABNORMAL LOW (ref 80.0–100.0)
Platelets: 299 10*3/uL (ref 150–400)
RBC: 5.49 MIL/uL — ABNORMAL HIGH (ref 3.87–5.11)
RDW: 16.5 % — ABNORMAL HIGH (ref 11.5–15.5)
WBC: 12.1 10*3/uL — ABNORMAL HIGH (ref 4.0–10.5)
nRBC: 0 % (ref 0.0–0.2)

## 2020-12-29 LAB — URINALYSIS, ROUTINE W REFLEX MICROSCOPIC
Bilirubin Urine: NEGATIVE
Glucose, UA: NEGATIVE mg/dL
Hgb urine dipstick: NEGATIVE
Ketones, ur: NEGATIVE mg/dL
Leukocytes,Ua: NEGATIVE
Nitrite: NEGATIVE
Protein, ur: NEGATIVE mg/dL
Specific Gravity, Urine: 1.02 (ref 1.005–1.030)
pH: 7 (ref 5.0–8.0)

## 2020-12-29 LAB — COMPREHENSIVE METABOLIC PANEL
ALT: <5 U/L (ref 0–44)
AST: 12 U/L — ABNORMAL LOW (ref 15–41)
Albumin: 4.4 g/dL (ref 3.5–5.0)
Alkaline Phosphatase: 49 U/L (ref 38–126)
Anion gap: 7 (ref 5–15)
BUN: 6 mg/dL (ref 6–20)
CO2: 24 mmol/L (ref 22–32)
Calcium: 9.2 mg/dL (ref 8.9–10.3)
Chloride: 106 mmol/L (ref 98–111)
Creatinine, Ser: 0.67 mg/dL (ref 0.44–1.00)
GFR, Estimated: >60 mL/min (ref >60)
Glucose, Bld: 76 mg/dL (ref 70–99)
Potassium: 4.2 mmol/L (ref 3.5–5.1)
Sodium: 137 mmol/L (ref 135–145)
Total Bilirubin: 0.8 mg/dL (ref 0.3–1.2)
Total Protein: 7.5 g/dL (ref 6.5–8.1)

## 2020-12-29 LAB — PREGNANCY, URINE: Preg Test, Ur: NEGATIVE

## 2020-12-29 LAB — WET PREP, GENITAL
Sperm: NONE SEEN
Trich, Wet Prep: NONE SEEN
Yeast Wet Prep HPF POC: NONE SEEN

## 2020-12-29 LAB — LIPASE, BLOOD: Lipase: 11 U/L (ref 11–51)

## 2020-12-29 MED ORDER — ACETAMINOPHEN 325 MG PO TABS
650.0000 mg | ORAL_TABLET | Freq: Once | ORAL | Status: DC
  Filled 2020-12-29: qty 2

## 2020-12-29 MED ORDER — ONDANSETRON HCL 4 MG/2ML IJ SOLN
4.0000 mg | Freq: Once | INTRAMUSCULAR | Status: AC
  Administered 2020-12-29: 4 mg via INTRAVENOUS

## 2020-12-29 MED ORDER — IOHEXOL 300 MG/ML  SOLN
100.0000 mL | Freq: Once | INTRAMUSCULAR | Status: AC | PRN
  Administered 2020-12-29: 100 mL via INTRAVENOUS

## 2020-12-29 MED ORDER — IBUPROFEN 600 MG PO TABS: 600.0000 mg | ORAL_TABLET | Freq: Four times a day (QID) | ORAL | 0 refills | Status: DC | PRN

## 2020-12-29 MED ORDER — METRONIDAZOLE 500 MG PO TABS
500.0000 mg | ORAL_TABLET | Freq: Two times a day (BID) | ORAL | 0 refills | Status: AC
Start: 2020-12-29 — End: 2021-01-05

## 2020-12-29 MED ORDER — MORPHINE SULFATE (PF) 4 MG/ML IV SOLN
4.0000 mg | Freq: Once | INTRAVENOUS | Status: AC
Start: 2020-12-29 — End: 2020-12-29
  Administered 2020-12-29: 4 mg via INTRAVENOUS
  Filled 2020-12-29: qty 1

## 2020-12-29 MED ORDER — KETOROLAC TROMETHAMINE 15 MG/ML IJ SOLN
15.0000 mg | Freq: Once | INTRAMUSCULAR | Status: AC
  Administered 2020-12-29: 15 mg via INTRAVENOUS
  Filled 2020-12-29: qty 1

## 2020-12-29 NOTE — ED Notes (Signed)
Patient is resting at this time. Rn rounded on patient, patient noted to be laying in the dark with her head on pillow and eyes closed in no active distress. Awaiting provider to discuss imaging results and will update vitals at that time.

## 2020-12-29 NOTE — ED Notes (Signed)
In and out Cath performed. Patient tolerated procedure well. Awaiting Urine pregnancy results to request stronger pain medication. Patient is aware of plan of care.

## 2020-12-29 NOTE — ED Provider Notes (Signed)
MEDCENTER [facility] EMERGENCY DEPT Provider Note   CSN: [id number] Arrival date & time: 12/29/20  1538     History Chief Complaint  Patient presents with   Abdominal Pain   Vaginal Bleeding    Alexis [name] is a 25 y.o. female who presents to the emergency department with lower abdominal pain and vaginal bleeding since yesterday.  Patient has been having heavy vaginal bleeding with a lot of clots. She has irregular periods and they are typically not heavy.  She rates her abdominal pain 10/10 in severity.  She is unsure whether or not she is pregnant.  She has not taking anything for her abdominal pain or vaginal bleeding.  She denies any nausea, vomiting, diarrhea, fever, chills, urinary complaints, chest pain, shortness of breath.  No other vaginal discharge or foul odor.   Abdominal Pain Associated symptoms: vaginal bleeding   Vaginal Bleeding Associated symptoms: abdominal pain       Past Medical History:  Diagnosis Date   Anemia    Chlamydia    Seizures (HCC)    Shingles outbreak 10/2018   Trichomonas infection     Patient Active Problem List   Diagnosis Date Noted   Normal labor 02/07/2019   PID (acute pelvic inflammatory disease) 02/23/2016    Past Surgical History:  Procedure Laterality Date   NO PAST SURGERIES       OB History     Gravida  2   Para  2   Term  2   Preterm      AB      Living  2      SAB      IAB      Ectopic      Multiple  0   Live Births  1           Family History  Family history unknown: Yes    Social History   Tobacco Use   Smoking status: Every Day    Packs/day: 0.50    Types: Cigarettes   Smokeless tobacco: Never  Vaping Use   Vaping Use: Never used  Substance Use Topics   Alcohol use: No   Drug use: Not Currently    Home Medications Prior to Admission medications   Medication Sig Start Date End Date Taking? Authorizing Provider  QUEtiapine (SEROQUEL) 25 MG tablet Take 25 mg by  mouth at bedtime.    [provider]  trimethoprim-polymyxin b (POLYTRIM) ophthalmic solution Place 1 drop into both eyes every 4 (four) hours. 12/28/20   Cathlyn Parsons, NP    Allergies    Patient has no known allergies.  Review of Systems   Review of Systems  Gastrointestinal:  Positive for abdominal pain.  Genitourinary:  Positive for vaginal bleeding.  All other systems reviewed and are negative.  Physical Exam Updated Vital Signs BP (!) 128/91 (BP Location: Right Arm)   Pulse 81   Temp 98.6 F (37 C) (Oral)   Resp 17   Ht 5\' 5"  (1.651 m)   Wt 70.3 kg   SpO2 100%   BMI 25.79 kg/m   Physical Exam Vitals and nursing note reviewed.  Constitutional:      General: She is not in acute distress.    Appearance: Normal appearance.  HENT:     Head: Normocephalic and atraumatic.  Eyes:     General:        Right eye: No discharge.        Left eye: No  discharge.  Cardiovascular:     Comments: Regular rate and rhythm.  S1/S2 are distinct without any evidence of murmur, rubs, or gallops.  Radial pulses are 2+ bilaterally.  Dorsalis pedis pulses are 2+ bilaterally.  No evidence of pedal edema. Pulmonary:     Comments: Clear to auscultation bilaterally.  Normal effort.  No respiratory distress.  No evidence of wheezes, rales, or rhonchi heard throughout. Abdominal:     General: Abdomen is flat. Bowel sounds are normal. There is no distension.     Tenderness: There is no guarding or rebound.     Comments: Moderate diffuse abdominal tenderness.  Genitourinary:    Vagina: Normal.     Comments: External anatomy was normal.  Labia bilaterally were normal.  There was trace amount of blood coming from the vaginal canal.  Cervix was normal.  Mild to moderate amount of blood in the vaginal canal and from the cervical os.  There was mild right adnexal tenderness on bimanual exam. Musculoskeletal:        General: Normal range of motion.     Cervical back: Neck supple.  Skin:     General: Skin is warm and dry.     Findings: No rash.  Neurological:     General: No focal deficit present.     Mental Status: She is alert.  Psychiatric:        Mood and Affect: Mood normal.        Behavior: Behavior normal.    ED Results / Procedures / Treatments   Labs (all labs ordered are listed, but only abnormal results are displayed) Labs Reviewed  COMPREHENSIVE METABOLIC PANEL - Abnormal; Notable for the following components:      Result Value   AST 12 (*)    All other components within normal limits  CBC - Abnormal; Notable for the following components:   WBC 12.1 (*)    RBC 5.49 (*)    MCV 70.5 (*)    MCH 22.4 (*)    RDW 16.5 (*)    All other components within normal limits  WET PREP, GENITAL  LIPASE, BLOOD  URINALYSIS, ROUTINE W REFLEX MICROSCOPIC  PREGNANCY, URINE    EKG None  Radiology CT ABDOMEN PELVIS W CONTRAST  Result Date: 12/29/2020 CLINICAL DATA:  Lower abdominal/suprapubic pain and vaginal bleeding beginning last night. Fever. Difficulty urinating. EXAM: CT ABDOMEN AND PELVIS WITH CONTRAST TECHNIQUE: Multidetector CT imaging of the abdomen and pelvis was performed using the standard protocol following bolus administration of intravenous contrast. CONTRAST:  OMNIPAQUE IOHEXOL 300 MG/ML  SOLN COMPARISON:  None. FINDINGS: Lower chest: Clear lung bases. Hepatobiliary: No focal liver abnormality is seen. No gallstones, gallbladder wall thickening, or biliary dilatation. Pancreas: Unremarkable. Spleen: Unremarkable. Adrenals/Urinary Tract: Unremarkable adrenal glands. Excreted contrast material in the renal collecting systems limiting assessment for calculi. No renal mass or hydronephrosis. Small amount of heterogeneous hyperattenuation in the bladder lumen which could reflect contrast (unlikely hemorrhage given the patient's normal urinalysis). Stomach/Bowel: The stomach is unremarkable. There is no evidence of bowel obstruction or inflammation. The  appendix is likely retrocecal without evidence of appendicitis. Vascular/Lymphatic: Normal caliber of the abdominal aorta. No enlarged lymph nodes. Reproductive: IUD terminating in the uterine fundus. No adnexal mass. Other: Trace pelvic free fluid.  No pneumoperitoneum. Musculoskeletal: No acute osseous abnormality or suspicious osseous lesion. IMPRESSION: No acute abnormality identified in the abdomen or pelvis. Electronically Signed   By: Sebastian Ache M.D.   On: 12/29/2020 20:47  Procedures Procedures   Medications Ordered in ED Medications  morphine 4 MG/ML injection 4 mg (4 mg Intravenous Given 12/29/20 2003)  iohexol (OMNIPAQUE) 300 MG/ML solution 100 mL (100 mLs Intravenous Contrast Given 12/29/20 2013)  ondansetron (ZOFRAN) injection 4 mg (4 mg Intravenous Given 12/29/20 2007)    ED Course  I have reviewed the triage vital signs and the nursing notes.  Pertinent labs & imaging results that were available during my care of the patient were reviewed by me and considered in my medical decision making (see chart for details).  Clinical Course as of 12/29/20 2219  Tue Dec 29, 2020  2101 Patient has a low suspicion for STDs.  She is active with 1 partner would like to hold off on STD testing at this time.  I will perform a pelvic exam and wet prep.  On my reevaluation, she is feeling much better but still in mild amount of pain. [CF]  2210 I discussed this case with my attending physician who cosigned this note including patient's presenting symptoms, physical exam, and planned diagnostics and interventions. Attending physician stated agreement with plan or made changes to plan which were implemented.   [CF]    Clinical Course User Index [CF] Jolyn Lent   MDM Rules/Calculators/A&P                          Alexis [name] is a 25 y.o. female who presents the emergency department today for evaluation of diffuse abdominal pain and vaginal bleeding.  Given the severity of  her pain on physical exam I will obtain some basic labs and CT scan of the abdomen and pelvis.  Ultrasound is not available at this time.  CBC revealed mild leukocytosis was without any signs of anemia.  UA was negative.  CMP was normal.  Pregnancy was negative. CT abdomen was without any acute abnormalities.  Have a low suspicion for ovarian torsion, appendicitis, diverticulitis, cystitis, pyelonephritis, kidney stone, hepatobiliary pathology.  Patient is feeling much better on reevaluation.  Is unclear at this time what is exactly causing her pain.  However, given that she is a regular and currently on her menstrual cycle this could be just regular menstrual cramps.  Her pain was I will control with morphine in the department today.  I will give her 1 shot of Toradol with instructions to start taking 600 mg of ibuprofen every 6 hours.  Patient expressed full understanding and she is safe for discharge.  All questions and concerns addressed.  Wet prep is pending. Dr. Renaye Rakers my attending will follow up on result and discharge patients as long as everything checks out.    Final Clinical Impression(s) / ED Diagnoses Final diagnoses:  Generalized abdominal pain  Vaginal bleeding    Rx / DC Orders ED Discharge Orders     None        Jolyn Lent 12/29/20 2223    Terald Sleeper, MD 12/30/20 1143

## 2020-12-29 NOTE — ED Notes (Addendum)
RN called to room due to pt screaming into hallway. Pt screaming about long wait time, have not been seen by doctor yet.  RN informed pt that we are currently waiting for a provider to sign up for pt to evaluate test results and determine plan of care.  Pt continues to scream profanity at RN, expressing frustration at long wait time, etc. Pt reports at this time she is going to wait to be seen by a doctor. EDP MD Trifan and PA Fredricka Bonine made aware. Call bell within reach, will continue to monitor.

## 2020-12-29 NOTE — Patient Instructions (Signed)
Based on what you shared with me, I feel your condition warrants further evaluation as soon as possible at an Emergency department.      [facility] [address]  Get Driving Directions  [address]  [place], [place] [address]  Open 24/7/365    Emergency Department- [facility]  Get Driving Directions  [phone]  [address]  [place], [place] [address]  Open 24/7/365      [facility]  Get Driving Directions  [phone]  [address]  [place], [place] [address]  Open 24/7/365    [place]  Emergency Department- [facility]  Get Driving Directions  [phone]  [address]  [place], [place] [address]  Open 24/7/365    [place]  Emergency Department- [facility] [place]  Get Driving Directions  [address]  [place], [place] [address]  Open 24/7/365    [place]  Emergency Department- [place] [facility]  Get Driving Directions  [phone]  [address]  [place],  [address]  Open 24/7/365    

## 2020-12-29 NOTE — ED Triage Notes (Signed)
Pt arrives to ED with c/o of lower/suprapubic abdominal pain and heavy vaginal bleeding. Pt report this started last night. She reports she is going through a tampon and pad every hour. She is having large clots.The pain is sharp. No N/V/D. She reports that she has not been able to urinate today. O fevers/chills.

## 2020-12-29 NOTE — Discharge Instructions (Addendum)
Please take 600 mg ibuprofen every 6 hours for menstrual cramps and vaginal bleeding.  I like for you to follow-up with your OB/GYN for further evaluation.  Please turn to the emergency room sooner if you experience worsening pain, dizziness, shortness of breath, loss of consciousness, or any other concerns you might have.   You were also diagnosed with bacterial vaginosis today.  I prescribed a medicine called Flagyl to take for the next 7 days to treat this condition.  Please do not drink any alcohol while using this antibiotic, as it will make you very sick.

## 2020-12-29 NOTE — Progress Notes (Signed)
Virtual Visit Consent   [name], you are scheduled for a virtual visit with a [facility] provider today.     Just as with appointments in the office, your consent must be obtained to participate.  Your consent will be active for this visit and any virtual visit you may have with one of our providers in the next 365 days.     If you have a MyChart account, a copy of this consent can be sent to you electronically.  All virtual visits are billed to your insurance company just like a traditional visit in the office.    As this is a virtual visit, video technology does not allow for your provider to perform a traditional examination.  This may limit your provider's ability to fully assess your condition.  If your provider identifies any concerns that need to be evaluated in person or the need to arrange testing (such as labs, EKG, etc.), we will make arrangements to do so.     Although advances in technology are sophisticated, we cannot ensure that it will always work on either your end or our end.  If the connection with a video visit is poor, the visit may have to be switched to a telephone visit.  With either a video or telephone visit, we are not always able to ensure that we have a secure connection.     I need to obtain your verbal consent now.   Are you willing to proceed with your visit today?    Alexis [name] has provided verbal consent on 12/29/2020 for a virtual visit (video or telephone).   Piedad Climes, [place]   Date: 12/29/2020 3:03 PM   Virtual Visit via Video Note   I, Piedad Climes, connected with  Alexis [name]  ([id number], 02/21/1996) on 12/29/20 at  3:00 PM EST by a video-enabled telemedicine application and verified that I am speaking with the correct person using two identifiers.  Location: Patient: Virtual Visit Location Patient: Home Provider: Virtual Visit Location Provider: Home Office   I discussed the limitations of evaluation and  management by telemedicine and the availability of in person appointments. The patient expressed understanding and agreed to proceed.    History of Present Illness: Alexis [name] is a 25 y.o. who identifies as a female who was assigned female at birth, and is being seen today for severe abdominal pain. Started last night with significant sore all over her body, turning into severe aches with severe lower abdominal pain bilaterally about 10/10 on pain scale and not responding to medications. Hard to ambulate secondary to pain. Needs to urinate badly but has been unable to do see. Started her period yesterday as spotting but now with severe vaginal flow with clotting.   HPI: HPI  Problems:  Patient Active Problem List   Diagnosis Date Noted   Normal labor 02/07/2019   PID (acute pelvic inflammatory disease) 02/23/2016    Allergies: No Known Allergies Medications:  Current Outpatient Medications:    QUEtiapine (SEROQUEL) 25 MG tablet, Take 25 mg by mouth at bedtime., Disp: , Rfl:    trimethoprim-polymyxin b (POLYTRIM) ophthalmic solution, Place 1 drop into both eyes every 4 (four) hours., Disp: 10 mL, Rfl: 0  Observations/Objective: Patient is well-developed, well-nourished in painful distress.  Restless, ambulating at home.  Head is normocephalic, atraumatic.  No labored breathing. Speech is clear and coherent with logical content.  Patient is alert and oriented at baseline.   Assessment  and Plan: 1. Vaginal bleeding  2. Lower abdominal pain Severe pain with substantial bleeding and large clot formation. Giving acuity and severity of symptoms she has been instructed to seek care at nearest ER. Her finace is going to take her directly to one of our facilities now.   Follow Up Instructions: I discussed the assessment and treatment plan with the patient. The patient was provided an opportunity to ask questions and all were answered. The patient agreed with the plan and demonstrated  an understanding of the instructions.  A copy of instructions were sent to the patient via MyChart unless otherwise noted below.   The patient was advised to call back or seek an in-person evaluation if the symptoms worsen or if the condition fails to improve as anticipated.  Time:  I spent 10 minutes with the patient via telehealth technology discussing the above problems/concerns.    Piedad Climes, PA-C

## 2020-12-30 ENCOUNTER — Encounter (HOSPITAL_COMMUNITY): Payer: Self-pay

## 2020-12-30 ENCOUNTER — Emergency Department (HOSPITAL_COMMUNITY)
Admission: EM | Admit: 2020-12-30 | Discharge: 2020-12-30 | Discharge status: Against Medical Advice | Payer: Medicaid Other | Attending: Emergency Medicine | Admitting: Emergency Medicine

## 2020-12-30 ENCOUNTER — Emergency Department: Admit: 2020-12-30 | Payer: Self-pay

## 2020-12-30 ENCOUNTER — Other Ambulatory Visit: Payer: Self-pay

## 2020-12-30 ENCOUNTER — Telehealth: Payer: Medicaid Other | Admitting: Family Medicine

## 2020-12-30 ENCOUNTER — Emergency Department (INDEPENDENT_AMBULATORY_CARE_PROVIDER_SITE_OTHER)
Admission: EM | Admit: 2020-12-30 | Discharge: 2020-12-30 | Disposition: A | Discharge status: Home / Self Care | Payer: Medicaid Other | Source: Home / Self Care

## 2020-12-30 DIAGNOSIS — R109 Unspecified abdominal pain: Secondary | ICD-10-CM | POA: Diagnosis present

## 2020-12-30 DIAGNOSIS — R1084 Generalized abdominal pain: Secondary | ICD-10-CM

## 2020-12-30 DIAGNOSIS — Z91199 Patient's noncompliance with other medical treatment and regimen due to unspecified reason: Secondary | ICD-10-CM

## 2020-12-30 LAB — CBC WITH DIFFERENTIAL/PLATELET
Abs Immature Granulocytes: 0.12 10*3/uL — ABNORMAL HIGH (ref 0.00–0.07)
Basophils Absolute: 0 10*3/uL (ref 0.0–0.1)
Basophils Relative: 0 %
Eosinophils Absolute: 0 10*3/uL (ref 0.0–0.5)
Eosinophils Relative: 0 %
HCT: 38.4 % (ref 36.0–46.0)
Hemoglobin: 12.5 g/dL (ref 12.0–15.0)
Immature Granulocytes: 1 %
Lymphocytes Relative: 3 %
Lymphs Abs: 0.7 10*3/uL (ref 0.7–4.0)
MCH: 22.8 pg — ABNORMAL LOW (ref 26.0–34.0)
MCHC: 32.6 g/dL (ref 30.0–36.0)
MCV: 69.9 fL — ABNORMAL LOW (ref 80.0–100.0)
Monocytes Absolute: 0.7 10*3/uL (ref 0.1–1.0)
Monocytes Relative: 3 %
Neutro Abs: 19.7 10*3/uL — ABNORMAL HIGH (ref 1.7–7.7)
Neutrophils Relative %: 93 %
Platelets: 284 10*3/uL (ref 150–400)
RBC: 5.49 MIL/uL — ABNORMAL HIGH (ref 3.87–5.11)
RDW: 16.2 % — ABNORMAL HIGH (ref 11.5–15.5)
WBC: 21.2 10*3/uL — ABNORMAL HIGH (ref 4.0–10.5)
nRBC: 0 % (ref 0.0–0.2)

## 2020-12-30 LAB — COMPREHENSIVE METABOLIC PANEL
ALT: 8 U/L (ref 0–44)
AST: 13 U/L — ABNORMAL LOW (ref 15–41)
Albumin: 4.3 g/dL (ref 3.5–5.0)
Alkaline Phosphatase: 53 U/L (ref 38–126)
Anion gap: 8 (ref 5–15)
BUN: 6 mg/dL (ref 6–20)
CO2: 23 mmol/L (ref 22–32)
Calcium: 9.1 mg/dL (ref 8.9–10.3)
Chloride: 106 mmol/L (ref 98–111)
Creatinine, Ser: 0.66 mg/dL (ref 0.44–1.00)
GFR, Estimated: >60 mL/min (ref >60)
Glucose, Bld: 94 mg/dL (ref 70–99)
Potassium: 3.7 mmol/L (ref 3.5–5.1)
Sodium: 137 mmol/L (ref 135–145)
Total Bilirubin: 1.7 mg/dL — ABNORMAL HIGH (ref 0.3–1.2)
Total Protein: 8.2 g/dL — ABNORMAL HIGH (ref 6.5–8.1)

## 2020-12-30 LAB — LIPASE, BLOOD: Lipase: 22 U/L (ref 11–51)

## 2020-12-30 NOTE — ED Triage Notes (Signed)
Pt c/o abd pain x 4 days. Was seen at [facility] yesterday. Had CT done. Dx with cyst. Micah Flesher to [facility] today. Also saw another [facility] and given a shot of Rocephin. Pain 10/10 Tylenol and Motrin prn.

## 2020-12-30 NOTE — ED Notes (Addendum)
This RN went into the patient's room to ask for a urine sample and she stated she wanted to be catheterized. I asked her why she was asking for a catheter and asked her to try going to the bathroom and she began yelling at this RN stating that I was not listening to her and was using profanity toward this RN. I asked her to not speak to me with that manner. She grabbed all of her belongings and walked out of the room and around the nurses station as if she was leaving. She was yelling and cussing at staff the entire time. I informed her that I would need to remove her IV if she was leaving but she stated she would "take it out myself." Pt observed walking out but then after speaking with other staff, she was observed ambulating back into her room 23 but requesting a different nurse. The AD then went into the patient's room to deescalate the situation but shortly after the patient was again yelling, cursing and walking out of her room and accusing the staff of being "racist."

## 2020-12-30 NOTE — ED Triage Notes (Signed)
Per EMS-RUQ pain-started yesterday-the day before she started having vaginal bleeding which she said was not related to her menstrual cycle-100 mcg of Fentanyl and 4 mg of Zofran given in route

## 2020-12-30 NOTE — ED Notes (Addendum)
While out in the unit, this writer heard yelling and cussing with security and [employer] present.  Spoke with patient's nurse to find out what the situation was with the patient and why she was yelling and cussing.  Patient upset that the nurse asked her for a urine sample and that she had not been put in a room sooner.  I then went into the patient's room to speak with her.  Patient stating that she cannot urinate and has not been able to, that they need to cath her at [facility] yesterday.  I let the patient know that the nurse cannot just cath her, that an EDP has to give an order for that.  I also let the patient know that she cannot yell and cuss at staff, that she she continues to do that, she will be asked to leave, that I was aware she had been yelling and cussing on her phone in the lobby.  Patient immediately jumped up and stated that I could just take her IV out and she would leave to go somewhere else.  However as I was getting my gloves on she left the room, and was asking for a african [place] staff member to take her IV out but also did not wait for one to take out her IV and kept leave the unit. Once patient arrived to the lobby with me behind her asking her to stop so we could remove her IV, I had the [employer] officer in the lobby stop her in the parking lot. The patient finally agreed to come into the lobby and allow Sherrilyn Rist, RN to remove her IV and she then left.  Patient had also left her phone charger and that was also returned to her.

## 2020-12-30 NOTE — Progress Notes (Signed)
NS  Appears pt might be in the ED at [facility]  Did send the link twice prior to no showing patient.

## 2020-12-30 NOTE — Discharge Instructions (Addendum)
Advised/instructed patient to go to [facility] now for immediate evaluation of abdominal pain and WBC of 21.2.  Patient is accompanied by her husband this evening who will be driving her.  Patient agreed and verbalized understanding of these instructions and this plan of care this evening.

## 2020-12-30 NOTE — ED Notes (Signed)
Pt wheeled back to room via wheelchair - pt appears very upset that she was in the waiting room for 5 hours - verbally aggressive, loud and not redirectable.

## 2020-12-30 NOTE — ED Provider Notes (Signed)
[facility]    CSN: [id number] Arrival date & time: 12/30/20  1932      History   Chief Complaint Chief Complaint  Patient presents with   Abdominal Pain    HPI Alexis [name] is a 25 y.o. female.   HPI 25 year old female presents with abdominal pain for 4 days.  Patient was evaluated Draw [facility] ED yesterday and had CT of abdomen pelvis performed please see that encounter note.  Patient reports going to another urgent care and given Rocephin and doxycycline for pain which she is currently taking.  WBC today at 1215 was 21.2.  Past Medical History:  Diagnosis Date   Anemia    Chlamydia    Seizures (HCC)    Shingles outbreak 10/2018   Trichomonas infection     Patient Active Problem List   Diagnosis Date Noted   Normal labor 02/07/2019   PID (acute pelvic inflammatory disease) 02/23/2016    Past Surgical History:  Procedure Laterality Date   NO PAST SURGERIES      OB History     Gravida  2   Para  2   Term  2   Preterm      AB      Living  2      SAB      IAB      Ectopic      Multiple  0   Live Births  1            Home Medications    Prior to Admission medications   Medication Sig Start Date End Date Taking? Authorizing Provider  ibuprofen (ADVIL) 600 MG tablet Take 1 tablet (600 mg total) by mouth every 6 (six) hours as needed for up to 30 doses for moderate pain, cramping or mild pain. 12/29/20   Terald Sleeper, MD  metroNIDAZOLE (FLAGYL) 500 MG tablet Take 1 tablet (500 mg total) by mouth 2 (two) times daily for 7 days. 12/29/20 01/05/21  Terald Sleeper, MD  QUEtiapine (SEROQUEL) 25 MG tablet Take 25 mg by mouth at bedtime.    [provider]  trimethoprim-polymyxin b (POLYTRIM) ophthalmic solution Place 1 drop into both eyes every 4 (four) hours. 12/28/20   Cathlyn Parsons, NP    Family History Family History  Family history unknown: Yes    Social History Social History   Tobacco Use   Smoking  status: Every Day    Packs/day: 0.50    Types: Cigarettes   Smokeless tobacco: Never  Vaping Use   Vaping Use: Never used  Substance Use Topics   Alcohol use: No   Drug use: Not Currently     Allergies   Patient has no known allergies.   Review of Systems Review of Systems  Gastrointestinal:  Positive for abdominal pain.  All other systems reviewed and are negative.   Physical Exam Triage Vital Signs ED Triage Vitals  Enc Vitals Group     BP      Pulse      Resp      Temp      Temp src      SpO2      Weight      Height      Head Circumference      Peak Flow      Pain Score      Pain Loc      Pain Edu?      Excl. in GC?  No data found.  Updated Vital Signs There were no vitals taken for this visit.    Physical Exam Vitals and nursing note reviewed.  Constitutional:      Appearance: Normal appearance. She is normal weight.  HENT:     Head: Normocephalic and atraumatic.     Nose: Nose normal.     Mouth/Throat:     Mouth: Mucous membranes are moist.     Pharynx: Oropharynx is clear.  Eyes:     Extraocular Movements: Extraocular movements intact.     Conjunctiva/sclera: Conjunctivae normal.     Pupils: Pupils are equal, round, and reactive to light.  Cardiovascular:     Rate and Rhythm: Normal rate and regular rhythm.     Pulses: Normal pulses.     Heart sounds: Normal heart sounds.  Pulmonary:     Effort: Pulmonary effort is normal.     Breath sounds: Normal breath sounds.  Abdominal:     General: There is no distension.     Palpations: There is no mass.     Tenderness: There is abdominal tenderness. There is rebound. There is no guarding.     Hernia: No hernia is present.     Comments: Hypoactive bowel sounds x4 severely TTP over right upper quadrant.  Musculoskeletal:     Cervical back: Normal range of motion and neck supple.  Skin:    General: Skin is warm and dry.  Neurological:     General: No focal deficit present.     Mental  Status: She is alert and oriented to person, place, and time.     UC Treatments / Results  Labs (all labs ordered are listed, but only abnormal results are displayed) Labs Reviewed - No data to display  EKG   Radiology CT ABDOMEN PELVIS W CONTRAST  Result Date: 12/29/2020 CLINICAL DATA:  Lower abdominal/suprapubic pain and vaginal bleeding beginning last night. Fever. Difficulty urinating. EXAM: CT ABDOMEN AND PELVIS WITH CONTRAST TECHNIQUE: Multidetector CT imaging of the abdomen and pelvis was performed using the standard protocol following bolus administration of intravenous contrast. CONTRAST:  OMNIPAQUE IOHEXOL 300 MG/ML  SOLN COMPARISON:  None. FINDINGS: Lower chest: Clear lung bases. Hepatobiliary: No focal liver abnormality is seen. No gallstones, gallbladder wall thickening, or biliary dilatation. Pancreas: Unremarkable. Spleen: Unremarkable. Adrenals/Urinary Tract: Unremarkable adrenal glands. Excreted contrast material in the renal collecting systems limiting assessment for calculi. No renal mass or hydronephrosis. Small amount of heterogeneous hyperattenuation in the bladder lumen which could reflect contrast (unlikely hemorrhage given the patient's normal urinalysis). Stomach/Bowel: The stomach is unremarkable. There is no evidence of bowel obstruction or inflammation. The appendix is likely retrocecal without evidence of appendicitis. Vascular/Lymphatic: Normal caliber of the abdominal aorta. No enlarged lymph nodes. Reproductive: IUD terminating in the uterine fundus. No adnexal mass. Other: Trace pelvic free fluid.  No pneumoperitoneum. Musculoskeletal: No acute osseous abnormality or suspicious osseous lesion. IMPRESSION: No acute abnormality identified in the abdomen or pelvis. Electronically Signed   By: Sebastian Ache M.D.   On: 12/29/2020 20:47    Procedures Procedures (including critical care time)  Medications Ordered in UC Medications - No data to display  Initial  Impression / Assessment and Plan / UC Course  I have reviewed the triage vital signs and the nursing notes.  Pertinent labs & imaging results that were available during my care of the patient were reviewed by me and considered in my medical decision making (see chart for details).     MDM:  1.  Generalized abdominal pain-Advised/instructed patient to go to [facility] now for immediate evaluation of abdominal pain and WBC of 21.2.  Patient is accompanied by her husband this evening who will be driving her.  Patient agreed and verbalized understanding of these instructions and this plan of care this evening.  Patient discharged, hemodynamically stable. Final Clinical Impressions(s) / UC Diagnoses   Final diagnoses:  Generalized abdominal pain     Discharge Instructions      Advised/instructed patient to go to [facility] now for immediate evaluation of abdominal pain and WBC of 21.2.  Patient is accompanied by her husband this evening who will be driving her.  Patient agreed and verbalized understanding of these instructions and this plan of care this evening.     ED Prescriptions   None    PDMP not reviewed this encounter.   Trevor Iha, FNP 12/30/20 2035

## 2020-12-30 NOTE — ED Provider Notes (Signed)
Emergency Medicine Provider Triage Evaluation Note  Alexis [name] , [name] 25 y.o. female  was evaluated in triage.  Pt complains of abdominal pain.  Generalized in nature.  Seen at [facility] yesterday.  At that point had complaint of vaginal bleeding with heavy clots.  Today her pain is more generalized.  Had CT scan at that point.  No urinary complaints.  Multiple episodes of NBNB emesis.  No diarrhea.  Denies EtOH use, chronic NSAID use  Review of Systems  Positive: Abdominal pain, emesis Negative:   Physical Exam  BP (!) 110/97 (BP Location: Left Arm)   Pulse 97   Temp 98.5 F (36.9 C) (Oral)   Resp (!) 22   Ht 5\' 5"  (1.651 m)   Wt 69.9 kg   SpO2 100%   BMI 25.63 kg/m  Gen:   Awake, no distress   Resp:  Normal effort  MSK:   Moves extremities without difficulty  Other:    Medical Decision Making  Medically screening exam initiated at 10:57 AM.  Appropriate orders placed.  Alexis [name] was informed that the remainder of the evaluation will be completed by another provider, this initial triage assessment does not replace that evaluation, and the importance of remaining in the ED until their evaluation is complete.  Abdominal pain, emesis   Alexis Deshotel A, PA-C 12/30/20 1058    [place], 13/09/22, DO 12/30/20 1437

## 2020-12-31 DIAGNOSIS — F32A Depression, unspecified: Secondary | ICD-10-CM | POA: Insufficient documentation

## 2021-01-11 ENCOUNTER — Ambulatory Visit (HOSPITAL_COMMUNITY)
Admission: EM | Admit: 2021-01-11 | Discharge: 2021-01-11 | Disposition: A | Discharge status: Home / Self Care | Payer: Medicaid Other | Attending: Urgent Care | Admitting: Urgent Care

## 2021-01-11 ENCOUNTER — Other Ambulatory Visit: Payer: Self-pay

## 2021-01-11 ENCOUNTER — Encounter (HOSPITAL_COMMUNITY): Payer: Self-pay

## 2021-01-11 ENCOUNTER — Ambulatory Visit (INDEPENDENT_AMBULATORY_CARE_PROVIDER_SITE_OTHER): Payer: Medicaid Other

## 2021-01-11 DIAGNOSIS — R519 Headache, unspecified: Secondary | ICD-10-CM | POA: Diagnosis not present

## 2021-01-11 DIAGNOSIS — R6884 Jaw pain: Secondary | ICD-10-CM

## 2021-01-11 DIAGNOSIS — J3489 Other specified disorders of nose and nasal sinuses: Secondary | ICD-10-CM

## 2021-01-11 MED ORDER — KETOROLAC TROMETHAMINE 60 MG/2ML IM SOLN
60.0000 mg | Freq: Once | INTRAMUSCULAR | Status: AC
  Administered 2021-01-11: 60 mg via INTRAMUSCULAR

## 2021-01-11 MED ORDER — NAPROXEN 500 MG PO TABS: 500.0000 mg | ORAL_TABLET | Freq: Two times a day (BID) | ORAL | 0 refills | Status: DC

## 2021-01-11 MED ORDER — KETOROLAC TROMETHAMINE 60 MG/2ML IM SOLN
INTRAMUSCULAR | Status: AC
  Filled 2021-01-11: qty 2
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
## 2021-02-04 ENCOUNTER — Ambulatory Visit: Payer: Medicaid Other | Admitting: Neurology

## 2021-02-05 ENCOUNTER — Encounter: Payer: Self-pay | Admitting: Obstetrics and Gynecology

## 2021-02-05 ENCOUNTER — Other Ambulatory Visit: Payer: Self-pay

## 2021-02-05 ENCOUNTER — Ambulatory Visit: Payer: Medicaid Other | Admitting: Obstetrics and Gynecology

## 2021-02-05 VITALS — BP 144/94 | HR 72 | Temp 97.5°F | Ht 65.0 in | Wt 161.8 lb

## 2021-02-05 DIAGNOSIS — Z30432 Encounter for removal of intrauterine contraceptive device: Secondary | ICD-10-CM | POA: Diagnosis not present

## 2021-02-05 DIAGNOSIS — Z30431 Encounter for routine checking of intrauterine contraceptive device: Secondary | ICD-10-CM

## 2021-02-05 NOTE — Progress Notes (Signed)
° °  IUD Removal Procedure Note   Patient is 25 y.o. [name] who is here for Liletta IUD removal. She would like IUD removed secondary to frequent vaginal infections and pelvic pain. She has had issues with IUD. She understands that she could get pregnant after removal of IUD if she does not use another form of contraception. She has no other complaints today. Reviewed risks of removal including pain, bleeding, difficult removal and inability to remove IUD which may require surgical removal in OR. She affirms that she would like IUD removed.  BP (!) 144/94 (BP Location: Left Arm, Patient Position: Sitting, Cuff Size: Normal)    Pulse 72    Temp (!) 97.5 F (36.4 C) (Oral)    Ht 5\' 5"  (1.651 m)    Wt 161 lb 12.8 oz (73.4 kg)    LMP 01/08/2021 (Exact Date)    Breastfeeding No    BMI 26.92 kg/m   Patient with normal appearing external female genitalia. Graves speculum placed in vagina and Liletta IUD strings easily visualized. Strings grasped with ring forceps and removed easily. Minimal bleeding noted. All instruments removed from vagina. Patient tolerated procedure very well.    She was given post removal instructions. She is planning on using condoms for contraception.   Return 1 year for annual or prn.  There was 10 minutes face-to-face time spent discussing reasons she desires IUD removed, options of BC after IUD removal and all questions answered. There was 10 minutes of chart review time spent prior to this encounter. Total time spent = 20 minutes.   01/10/2021, MSN, CNM 02/05/2021 11:42 AM

## 2021-02-21 DIAGNOSIS — A599 Trichomoniasis, unspecified: Secondary | ICD-10-CM

## 2021-02-21 HISTORY — DX: Trichomoniasis, unspecified: A59.9

## 2021-02-21 NOTE — L&D Delivery Note (Signed)
Delivery Note At 9:28 PM a {baby status:3041425} female was delivered via Vaginal, Spontaneous (Presentation: Left Occiput Anterior).  APGAR: 9, 9; weight 5 lb 7.5 oz (2480 g).   Placenta status: Spontaneous, Intact.  Cord: 3 vessels with the following complications: Knot.  Cord pH: ***  Anesthesia: Epidural Episiotomy: None Lacerations: None Suture Repair: {suture HOZY:2482500} Est. Blood Loss (mL): 102  Mom to {mom status:3041454}.  Baby to {baby status:3041455}.  Alexis [name] is a 26 y.o. female [id number] with IUP at [redacted]w[redacted]d admitted for **** .  She progressed with/without augmentation to complete and pushed **** to deliver.  Cord clamping delayed by several minutes then clamped by CNM and cut by ***.  Placenta intact and spontaneous, bleeding minimal.  ***** laceration repaired without difficulty.  Mom and baby stable prior to transfer to postpartum. She plans on breastfeeding. She requests *** for birth control.   Misty Stanley Leftwich-Kirby 10/30/2021, 12:01 AM

## 2021-03-08 ENCOUNTER — Telehealth: Payer: Medicaid Other | Admitting: Nurse Practitioner

## 2021-03-08 DIAGNOSIS — R110 Nausea: Secondary | ICD-10-CM

## 2021-03-08 NOTE — Progress Notes (Signed)
Thank you for taking an at home test. Now that we know you are pregnant our best recommendation would be to get established with an OBGYN if you do not have one.   Let us know if you need a list of providers in the area.   We do not routinely prescribe medication for women that are pregnant, it is preferred your OBGYN discusses with you safe medications to use in pregnancy.   For today the best thing to do is concentrate on staying hydrated. If you are unable to keep liquids down, or if you have not urinated in 12 hours you will need to go to the ER for hydration therapy.   Start by chewing on some ice, sipping on water and ginger ale.   Please let us know if you have any questions.    HOME CARE: Drink clear liquids.  This is very important! Dehydration (the lack of fluid) can lead to a serious complication.  Start off with 1 tablespoon every 5 minutes for 8 hours. You may begin eating bland foods after 8 hours without vomiting.  Start with saltine crackers, white bread, rice, mashed potatoes, applesauce. After 48 hours on a bland diet, you may resume a normal diet. Try to go to sleep.  Sleep often empties the stomach and relieves the need to vomit.  GET HELP RIGHT AWAY IF:  Your symptoms do not improve or worsen within 2 days after treatment. You have a fever for over 3 days. You cannot keep down fluids after trying the medication.  MAKE SURE YOU:  Understand these instructions. Will watch your condition. Will get help right away if you are not doing well or get worse.    Thank you for choosing an e-visit.  Your e-visit answers were reviewed by a board certified advanced clinical practitioner to complete your personal care plan. Depending upon the condition, your plan could have included both over the counter or prescription medications.  Please review your pharmacy choice. Make sure the pharmacy is open so you can pick up prescription now. If there is a problem, you may contact  your provider through [employer] and have the prescription routed to another pharmacy.  Your safety is important to [place]. If you have drug allergies check your prescription carefully.   For the next 24 hours you can use MyChart to ask questions about today's visit, request a non-urgent call back, or ask for a work or school excuse. You will get an email in the next two days asking about your experience. I hope that your e-visit has been valuable and will speed your recovery.   I spent approximately 7 minutes reviewing the patient's history, current symptoms and coordinating their plan of care today.

## 2021-03-09 ENCOUNTER — Other Ambulatory Visit: Payer: Self-pay

## 2021-03-09 ENCOUNTER — Inpatient Hospital Stay (HOSPITAL_COMMUNITY)
Admission: AD | Admit: 2021-03-09 | Discharge: 2021-03-10 | Disposition: A | Discharge status: Home / Self Care | Payer: Medicaid Other | Attending: Obstetrics and Gynecology | Admitting: Obstetrics and Gynecology

## 2021-03-09 ENCOUNTER — Encounter (HOSPITAL_COMMUNITY): Payer: Self-pay | Admitting: Obstetrics and Gynecology

## 2021-03-09 DIAGNOSIS — R102 Pelvic and perineal pain: Secondary | ICD-10-CM | POA: Insufficient documentation

## 2021-03-09 DIAGNOSIS — Z3A00 Weeks of gestation of pregnancy not specified: Secondary | ICD-10-CM | POA: Insufficient documentation

## 2021-03-09 DIAGNOSIS — O26891 Other specified pregnancy related conditions, first trimester: Secondary | ICD-10-CM | POA: Insufficient documentation

## 2021-03-09 DIAGNOSIS — O26899 Other specified pregnancy related conditions, unspecified trimester: Secondary | ICD-10-CM

## 2021-03-09 DIAGNOSIS — F17210 Nicotine dependence, cigarettes, uncomplicated: Secondary | ICD-10-CM | POA: Insufficient documentation

## 2021-03-09 DIAGNOSIS — O219 Vomiting of pregnancy, unspecified: Secondary | ICD-10-CM | POA: Insufficient documentation

## 2021-03-09 DIAGNOSIS — R109 Unspecified abdominal pain: Secondary | ICD-10-CM

## 2021-03-09 DIAGNOSIS — O99331 Smoking (tobacco) complicating pregnancy, first trimester: Secondary | ICD-10-CM | POA: Diagnosis not present

## 2021-03-09 DIAGNOSIS — O3680X0 Pregnancy with inconclusive fetal viability, not applicable or unspecified: Secondary | ICD-10-CM | POA: Insufficient documentation

## 2021-03-09 LAB — COMPREHENSIVE METABOLIC PANEL
ALT: 11 U/L (ref 0–44)
AST: 14 U/L — ABNORMAL LOW (ref 15–41)
Albumin: 3.6 g/dL (ref 3.5–5.0)
Alkaline Phosphatase: 42 U/L (ref 38–126)
Anion gap: 7 (ref 5–15)
BUN: 7 mg/dL (ref 6–20)
CO2: 25 mmol/L (ref 22–32)
Calcium: 9.1 mg/dL (ref 8.9–10.3)
Chloride: 105 mmol/L (ref 98–111)
Creatinine, Ser: 0.72 mg/dL (ref 0.44–1.00)
GFR, Estimated: >60 mL/min (ref >60)
Glucose, Bld: 84 mg/dL (ref 70–99)
Potassium: 3.8 mmol/L (ref 3.5–5.1)
Sodium: 137 mmol/L (ref 135–145)
Total Bilirubin: 0.6 mg/dL (ref 0.3–1.2)
Total Protein: 6.4 g/dL — ABNORMAL LOW (ref 6.5–8.1)

## 2021-03-09 LAB — URINALYSIS, ROUTINE W REFLEX MICROSCOPIC
Bilirubin Urine: NEGATIVE
Glucose, UA: NEGATIVE mg/dL
Hgb urine dipstick: NEGATIVE
Ketones, ur: NEGATIVE mg/dL
Nitrite: NEGATIVE
Protein, ur: NEGATIVE mg/dL
Specific Gravity, Urine: 1.025 (ref 1.005–1.030)
pH: 6 (ref 5.0–8.0)

## 2021-03-09 LAB — POCT PREGNANCY, URINE: Preg Test, Ur: POSITIVE — AB

## 2021-03-09 LAB — URINALYSIS, MICROSCOPIC (REFLEX)

## 2021-03-09 LAB — CBC
HCT: 35.3 % — ABNORMAL LOW (ref 36.0–46.0)
Hemoglobin: 11.2 g/dL — ABNORMAL LOW (ref 12.0–15.0)
MCH: 22.4 pg — ABNORMAL LOW (ref 26.0–34.0)
MCHC: 31.7 g/dL (ref 30.0–36.0)
MCV: 70.6 fL — ABNORMAL LOW (ref 80.0–100.0)
Platelets: 316 10*3/uL (ref 150–400)
RBC: 5 MIL/uL (ref 3.87–5.11)
RDW: 15.8 % — ABNORMAL HIGH (ref 11.5–15.5)
WBC: 9.2 10*3/uL (ref 4.0–10.5)
nRBC: 0 % (ref 0.0–0.2)

## 2021-03-09 NOTE — MAU Note (Signed)
Having pain in lower abd for 2-3days. Was in hospital in November with intestinal infection and pain is similar to what she had then. Wanted to be sure if she had another infection or if the pain is related to pregnancy. No VB. LMP 02/05/21

## 2021-03-10 ENCOUNTER — Inpatient Hospital Stay (HOSPITAL_COMMUNITY): Payer: Medicaid Other

## 2021-03-10 DIAGNOSIS — O26899 Other specified pregnancy related conditions, unspecified trimester: Secondary | ICD-10-CM

## 2021-03-10 DIAGNOSIS — R109 Unspecified abdominal pain: Secondary | ICD-10-CM

## 2021-03-10 DIAGNOSIS — O3680X0 Pregnancy with inconclusive fetal viability, not applicable or unspecified: Secondary | ICD-10-CM

## 2021-03-10 LAB — HCG, QUANTITATIVE, PREGNANCY: hCG, Beta Chain, Quant, S: 2798 m[IU]/mL — ABNORMAL HIGH (ref <5)

## 2021-03-10 MED ORDER — DOXYLAMINE-PYRIDOXINE 10-10 MG PO TBEC
DELAYED_RELEASE_TABLET | ORAL | 0 refills | Status: DC
Start: 2021-03-10 — End: 2021-06-17

## 2021-03-10 MED ORDER — METOCLOPRAMIDE HCL 10 MG PO TABS: 10.0000 mg | ORAL_TABLET | Freq: Three times a day (TID) | ORAL | 0 refills | Status: DC | PRN

## 2021-03-10 NOTE — Discharge Instructions (Addendum)
PLEASE follow up in 48 hours for a lab/nurse visit to check the pregnancy level in your blood like we discussed. I will see if we can have you swab at that time.   It is VERY important that you follow up sooner if this belly pain gets worse, you are unable to keep any fluids down, or worsening concerns. Try to drink small sips of room temperature fluids. Small frequent meals if tolerated.   Safe Medications in Pregnancy   Pain: Tylenol: 2 regular strength every 4 hours OR              2 Extra strength every 6 hours  Nausea/Vomiting:  Bonine Dramamine Emetrol Ginger extract Sea bands Meclizine  Nausea medication to take during pregnancy:  Unisom (doxylamine succinate 25 mg tablets) Take one tablet daily at bedtime. If symptoms are not adequately controlled, the dose can be increased to a maximum recommended dose of two tablets daily (1/2 tablet in the morning, 1/2 tablet mid-afternoon and one at bedtime). Vitamin B6 100mg  tablets. Take one tablet twice a day (up to 200 mg per day)

## 2021-03-10 NOTE — MAU Provider Note (Signed)
History     CSN: [id number]  Arrival date and time: 03/09/21 2130   None     Chief Complaint  Patient presents with   Abdominal Pain   Alexis [name] is 26 yo G40P2002 female presenting for evaluation of abdominal pain.   She recently found out she was pregnant a few days ago. Reports an approximate 3 day history of lower/suprapubic sharp abdominal pain that starts low and radiates upwards on both sides. It has not worsened in severity since onset, but has not improved so she sought out evaluation. Feels similar to her period cramping that are usually sharp as well. Lasts for about 5-10 minutes and coming every couple of hours. Associated with nausea and few episodes of emesis, mainly in the morning for the past few days. Denies any fever, dysuria, vaginal discharge, vaginal bleeding, or change in BM. Normal brown BM this morning. No known sick contacts. Sexually active with one partner. Tobacco use and history of THC use, quit THC about 2 weeks ago.    Patient remained in the waiting room while her labs and ultrasound results returned. She became significantly irritated and frustrated that she needed to leave to take care of her children at home. After discussion, she was willing to discuss her results but does not want to proceed with any further evaluation tonight.    Past Medical History:  Diagnosis Date   Anemia    Chlamydia    Seizures ([place])    Shingles outbreak 10/2018   Trichomonas infection     Past Surgical History:  Procedure Laterality Date   NO PAST SURGERIES      Family History  Family history unknown: Yes    Social History   Tobacco Use   Smoking status: Every Day    Packs/day: 0.50    Types: Cigarettes    Passive exposure: Never   Smokeless tobacco: Never  Vaping Use   Vaping Use: Never used  Substance Use Topics   Alcohol use: No   Drug use: Not Currently    Allergies: No Known Allergies  Medications Prior to Admission  Medication Sig Dispense Refill  Last Dose   ibuprofen (ADVIL) 600 MG tablet Take 1 tablet (600 mg total) by mouth every 6 (six) hours as needed for up to 30 doses for moderate pain, cramping or mild pain. 30 tablet 0    naproxen (NAPROSYN) 500 MG tablet Take 1 tablet (500 mg total) by mouth 2 (two) times daily with a meal. 30 tablet 0    QUEtiapine (SEROQUEL) 25 MG tablet Take 25 mg by mouth at bedtime.      trimethoprim-polymyxin b (POLYTRIM) ophthalmic solution Place 1 drop into both eyes every 4 (four) hours. 10 mL 0     Review of Systems  Constitutional:  Negative for chills, fatigue and fever.  Respiratory:  Negative for shortness of breath.   Cardiovascular:  Negative for chest pain.  Gastrointestinal:  Positive for abdominal pain, nausea and vomiting. Negative for blood in stool, constipation, diarrhea and rectal pain.  Genitourinary:  Negative for dysuria, urgency, vaginal bleeding and vaginal discharge.  Skin:  Negative for rash.  Neurological:  Negative for dizziness and light-headedness.  Physical Exam   Blood pressure 113/69, pulse 85, temperature 98.4 F (36.9 C), resp. rate 16, height 5\' 5"  (1.651 m), weight 72.6 kg, last menstrual period 02/05/2021, SpO2 100 %.  Physical Exam Constitutional:      General: She is not in acute distress.    Appearance: She  is well-developed. She is not ill-appearing or toxic-appearing.  HENT:     Head: Normocephalic and atraumatic.     Mouth/Throat:     Mouth: Mucous membranes are moist.  Eyes:     Extraocular Movements: Extraocular movements intact.  Cardiovascular:     Rate and Rhythm: Normal rate.  Pulmonary:     Effort: Pulmonary effort is normal.  Abdominal:     Hernia: No hernia is present.     Comments: Soft and non-distended. Negative murphy's sign and no Mcburney point tenderness. Negative Rovsing's. Mild tenderness to lower/suprapubic abdomen without rebounding or guarding, otherwise no tenderness elsewhere. No masses palpated.   Skin:    General: Skin is  warm and dry.  Neurological:     Mental Status: She is alert and oriented to person, place, and time.    MAU Course   MDM Abdominal cramping in first trimester:  UA, UPT CBC, HCG ABO/Rh- >> O positive blood type.  US OB Comp Less 14 weeks with Transvaginal   Bhcg 2,798. [place] with IUGS and "questionable" yolk sac. Discussed with pt the diagnosis of pregnancy of unknown anatomic location.  Three possibilities of outcome are: a healthy pregnancy that is too early to see a yolk sac to confirm the pregnancy is in the uterus, a pregnancy that is not healthy and has not developed and will not develop, and an ectopic pregnancy that is in the abdomen that cannot be identified at this time (hopefully less likely with possible yolk sac seen). Discussed that we need to follow the progression of this pregnancy carefully.  Recommended repeat Bhcg level in 48 hours. Message to [facility] sent to schedule for 1/20.   Assessment and Plan   1. Pregnancy of unknown anatomic location See above. Repeat Bhcg in 48 hours.   2. Abdominal pain affecting pregnancy Lower and sharp for several days with benign abdomen on exam. Unclear etiology, may be related to early pregnancy development, 2/2 N/V of early pregnancy. UA without signs c/w infection and no urinary sx. Doubt appendicitis given location of pain, no leukocytosis, or anorexia. Despite no vaginal discharge changes recently, given her history of gc/ch (most recent +gc in 11/22), recommended at least gc/ch/wet prep swabs tonight with pelvic exam, however patient declined to return home to her children. Hopeful to have swabs collected during 1/20 visit.   3. Nausea and vomiting in pregnancy Rx'd diclegis and reglan (if unable to get diclegis with insurance or less effective). Discussed small frequent meals, small sips of fluid, sea bands, and ginger.   Message sent for 1/20 lab/RN visit at [facility], however if unable to be done, may need to fu in the MAU. Strongly  discussed return precautions especially if this pain worsens or unable to tolerate liquids/dehydration etc.     Alexis [name] 03/10/2021, 2:20 AM

## 2021-03-10 NOTE — Progress Notes (Signed)
Written and verbal d/c instructions given by Dr Higinio Plan

## 2021-03-10 NOTE — MAU Note (Signed)
Dr Annia Friendly brought pt into [facility] to discuss plan of care

## 2021-03-11 ENCOUNTER — Telehealth: Payer: Self-pay | Admitting: Obstetrics and Gynecology

## 2021-03-11 NOTE — Telephone Encounter (Signed)
Attempted to reach patient about scheduling an appointment. Was not able to reach. Was able to leave a voicemail message for her to call the office.

## 2021-03-12 ENCOUNTER — Ambulatory Visit (INDEPENDENT_AMBULATORY_CARE_PROVIDER_SITE_OTHER): Payer: Medicaid Other | Admitting: *Deleted

## 2021-03-12 ENCOUNTER — Other Ambulatory Visit (HOSPITAL_COMMUNITY)
Admission: RE | Admit: 2021-03-12 | Discharge: 2021-03-12 | Disposition: A | Discharge status: Home / Self Care | Payer: Medicaid Other | Source: Ambulatory Visit

## 2021-03-12 ENCOUNTER — Other Ambulatory Visit: Payer: Self-pay

## 2021-03-12 VITALS — BP 105/71 | HR 92 | Temp 97.8°F | Ht 65.0 in | Wt 158.2 lb

## 2021-03-12 DIAGNOSIS — O3680X0 Pregnancy with inconclusive fetal viability, not applicable or unspecified: Secondary | ICD-10-CM

## 2021-03-12 DIAGNOSIS — O26899 Other specified pregnancy related conditions, unspecified trimester: Secondary | ICD-10-CM | POA: Insufficient documentation

## 2021-03-12 DIAGNOSIS — R102 Pelvic and perineal pain: Secondary | ICD-10-CM

## 2021-03-12 DIAGNOSIS — O26891 Other specified pregnancy related conditions, first trimester: Secondary | ICD-10-CM

## 2021-03-12 DIAGNOSIS — R109 Unspecified abdominal pain: Secondary | ICD-10-CM

## 2021-03-12 LAB — BETA HCG QUANT (REF LAB): hCG Quant: 7498 m[IU]/mL

## 2021-03-12 NOTE — Progress Notes (Signed)
° °  SUBJECTIVE:   [name] presents to [facility] for follow-up quant hCG blood draw today. She was seen in MAU for abdominal pain on 03/09/2021. Patient endorses abdominal pain and denies bleeding today. Discussed with patient, we are following hCG levels today. Results will be back in approximately 2 hours. Valid contact number for patient confirmed. I will call the patient with results.   No UTI symptoms. Denies history of known exposure to STD.  Patient's last menstrual period was 02/05/2021.  OBJECTIVE:   She appears well, afebrile. Urine dipstick: not done.  ASSESSMENT:  STAT Beta Hcg Self-vaginal swab to rule out vaginal infections  PLAN:   Beta Hcg results pending. GC, chlamydia, trichomonas, BVAG, CVAG probe sent to lab. Treatment: To be determined once lab results are received ROV prn if symptoms persist or worsen.   Clovis Pu 03/12/2021 9:59 AM

## 2021-03-13 ENCOUNTER — Other Ambulatory Visit: Payer: Self-pay

## 2021-03-13 DIAGNOSIS — O3680X0 Pregnancy with inconclusive fetal viability, not applicable or unspecified: Secondary | ICD-10-CM

## 2021-03-15 ENCOUNTER — Inpatient Hospital Stay (HOSPITAL_COMMUNITY)
Admission: AD | Admit: 2021-03-15 | Discharge: 2021-03-15 | Disposition: A | Discharge status: Home / Self Care | Payer: Medicaid Other | Attending: Obstetrics and Gynecology | Admitting: Obstetrics and Gynecology

## 2021-03-15 ENCOUNTER — Inpatient Hospital Stay (HOSPITAL_COMMUNITY): Payer: Medicaid Other

## 2021-03-15 ENCOUNTER — Other Ambulatory Visit: Payer: Self-pay

## 2021-03-15 DIAGNOSIS — O99331 Smoking (tobacco) complicating pregnancy, first trimester: Secondary | ICD-10-CM | POA: Diagnosis not present

## 2021-03-15 DIAGNOSIS — A599 Trichomoniasis, unspecified: Secondary | ICD-10-CM | POA: Diagnosis not present

## 2021-03-15 DIAGNOSIS — R109 Unspecified abdominal pain: Secondary | ICD-10-CM | POA: Insufficient documentation

## 2021-03-15 DIAGNOSIS — Z3490 Encounter for supervision of normal pregnancy, unspecified, unspecified trimester: Secondary | ICD-10-CM

## 2021-03-15 DIAGNOSIS — F17210 Nicotine dependence, cigarettes, uncomplicated: Secondary | ICD-10-CM | POA: Diagnosis not present

## 2021-03-15 DIAGNOSIS — Z3A01 Less than 8 weeks gestation of pregnancy: Secondary | ICD-10-CM | POA: Insufficient documentation

## 2021-03-15 DIAGNOSIS — O3680X0 Pregnancy with inconclusive fetal viability, not applicable or unspecified: Secondary | ICD-10-CM | POA: Diagnosis not present

## 2021-03-15 DIAGNOSIS — O98311 Other infections with a predominantly sexual mode of transmission complicating pregnancy, first trimester: Secondary | ICD-10-CM | POA: Insufficient documentation

## 2021-03-15 LAB — URINALYSIS, ROUTINE W REFLEX MICROSCOPIC
Bilirubin Urine: NEGATIVE
Glucose, UA: NEGATIVE mg/dL
Hgb urine dipstick: NEGATIVE
Ketones, ur: 20 mg/dL — AB
Nitrite: NEGATIVE
Protein, ur: NEGATIVE mg/dL
Specific Gravity, Urine: 1.021 (ref 1.005–1.030)
pH: 5 (ref 5.0–8.0)

## 2021-03-15 LAB — CBC
HCT: 34.6 % — ABNORMAL LOW (ref 36.0–46.0)
Hemoglobin: 11.5 g/dL — ABNORMAL LOW (ref 12.0–15.0)
MCH: 22.9 pg — ABNORMAL LOW (ref 26.0–34.0)
MCHC: 33.2 g/dL (ref 30.0–36.0)
MCV: 68.9 fL — ABNORMAL LOW (ref 80.0–100.0)
Platelets: 304 10*3/uL (ref 150–400)
RBC: 5.02 MIL/uL (ref 3.87–5.11)
RDW: 15.2 % (ref 11.5–15.5)
WBC: 11.3 10*3/uL — ABNORMAL HIGH (ref 4.0–10.5)
nRBC: 0 % (ref 0.0–0.2)

## 2021-03-15 LAB — CERVICOVAGINAL ANCILLARY ONLY
Bacterial Vaginitis (gardnerella): POSITIVE — AB
Candida Glabrata: NEGATIVE
Candida Vaginitis: POSITIVE — AB
Chlamydia: NEGATIVE
Comment: NEGATIVE
Comment: NEGATIVE
Comment: NEGATIVE
Comment: NEGATIVE
Comment: NEGATIVE
Comment: NORMAL
Neisseria Gonorrhea: NEGATIVE
Trichomonas: POSITIVE — AB

## 2021-03-15 LAB — COMPREHENSIVE METABOLIC PANEL
ALT: 11 U/L (ref 0–44)
AST: 17 U/L (ref 15–41)
Albumin: 3.9 g/dL (ref 3.5–5.0)
Alkaline Phosphatase: 42 U/L (ref 38–126)
Anion gap: 7 (ref 5–15)
BUN: <5 mg/dL — ABNORMAL LOW (ref 6–20)
CO2: 22 mmol/L (ref 22–32)
Calcium: 9 mg/dL (ref 8.9–10.3)
Chloride: 107 mmol/L (ref 98–111)
Creatinine, Ser: 0.63 mg/dL (ref 0.44–1.00)
GFR, Estimated: >60 mL/min (ref >60)
Glucose, Bld: 78 mg/dL (ref 70–99)
Potassium: 3.6 mmol/L (ref 3.5–5.1)
Sodium: 136 mmol/L (ref 135–145)
Total Bilirubin: 0.9 mg/dL (ref 0.3–1.2)
Total Protein: 7.1 g/dL (ref 6.5–8.1)

## 2021-03-15 LAB — HCG, QUANTITATIVE, PREGNANCY: hCG, Beta Chain, Quant, S: 26248 m[IU]/mL — ABNORMAL HIGH (ref <5)

## 2021-03-15 LAB — TYPE AND SCREEN
ABO/RH(D): O POS
Antibody Screen: NEGATIVE

## 2021-03-15 MED ORDER — ACETAMINOPHEN 500 MG PO TABS
1000.0000 mg | ORAL_TABLET | Freq: Once | ORAL | Status: AC
  Administered 2021-03-15: 1000 mg via ORAL
  Filled 2021-03-15: qty 2

## 2021-03-15 MED ORDER — ACETAMINOPHEN 325 MG PO TABS: 650.0000 mg | ORAL_TABLET | ORAL | 0 refills | Status: AC | PRN

## 2021-03-15 MED ORDER — METRONIDAZOLE 500 MG PO TABS
500.0000 mg | ORAL_TABLET | Freq: Once | ORAL | Status: AC
  Administered 2021-03-15: 500 mg via ORAL
  Filled 2021-03-15: qty 1

## 2021-03-15 MED ORDER — METRONIDAZOLE 500 MG PO TABS: 500.0000 mg | ORAL_TABLET | Freq: Two times a day (BID) | ORAL | 0 refills | Status: DC

## 2021-03-15 NOTE — MAU Provider Note (Signed)
History     CSN: [id number]  Arrival date and time: 03/15/21 2037  Event Date/Time  First Provider Initiated Contact with Patient 03/15/21 2123     Chief Complaint  Patient presents with   Abdominal Pain   HPI Alexis [name] is a 26 y.o. G3P2002 at [redacted]w[redacted]d who presents to MAU with chief complaint of worsening abdominal pain. This is a recurrent problem, onset last week and worsening over time. Patient's pain is suprapubic and radiates laterally around her right mid-abdomen and towards her right rib cage. She denies vaginal bleeding, dysuria, abnormal vaginal discharge, fever, falls, or recent illness.   Patient reports recent diagnosis of Trichomonas. She has not received treatment.    OB History     Gravida  3   Para  2   Term  2   Preterm      AB      Living  2      SAB      IAB      Ectopic      Multiple  0   Live Births  1           Past Medical History:  Diagnosis Date   Anemia    Chlamydia    Seizures ([facility])    Shingles outbreak 10/2018   Trichomonas infection     Past Surgical History:  Procedure Laterality Date   NO PAST SURGERIES      Family History  Family history unknown: Yes    Social History   Tobacco Use   Smoking status: Every Day    Packs/day: 0.50    Types: Cigarettes    Passive exposure: Never   Smokeless tobacco: Never  Vaping Use   Vaping Use: Never used  Substance Use Topics   Alcohol use: No   Drug use: Not Currently    Allergies: No Known Allergies  Medications Prior to Admission  Medication Sig Dispense Refill Last Dose   Doxylamine-Pyridoxine (DICLEGIS) 10-10 MG TBEC Take 2 tabs at bedtime. If needed, add another tab in the morning. If needed, add another tab in the afternoon, up to 4 tabs/day. 100 tablet 0    metoCLOPramide (REGLAN) 10 MG tablet Take 1 tablet (10 mg total) by mouth every 8 (eight) hours as needed for nausea or vomiting. 15 tablet 0     Review of Systems  Constitutional:  Negative for  fever.  Gastrointestinal:  Positive for abdominal pain.  Genitourinary:  Negative for dysuria, flank pain, urgency, vaginal bleeding and vaginal discharge.  All other systems reviewed and are negative. Physical Exam   Last menstrual period 02/05/2021.  Physical Exam Vitals and nursing note reviewed. Exam conducted with a chaperone present.  Constitutional:      Appearance: She is well-developed. She is not ill-appearing.  Cardiovascular:     Rate and Rhythm: Normal rate.     Heart sounds: Normal heart sounds.  Pulmonary:     Effort: Pulmonary effort is normal.  Abdominal:     General: Abdomen is flat.     Palpations: Abdomen is soft.     Tenderness: There is no abdominal tenderness. There is no right CVA tenderness or left CVA tenderness.  Skin:    General: Skin is warm.     Capillary Refill: Capillary refill takes less than 2 seconds.  Neurological:     Mental Status: She is alert.    MAU Course/MDM  Procedures --Abnormal UA with squam epi suggesting contaminated catch --Pertinent negatives: dysuria, flank  pain, CVAT  Orders Placed This Encounter  Procedures   US OB Transvaginal   CBC   Comprehensive metabolic panel   hCG, quantitative, pregnancy   Urinalysis, Routine w reflex microscopic Urine, Clean Catch   Type and screen [facility]   Discharge patient   Results for orders placed or performed during the hospital encounter of 03/15/21 (from the past 24 hour(s))  CBC     Status: Abnormal   Collection Time: 03/15/21  9:29 PM  Result Value Ref Range   WBC 11.3 (H) 4.0 - 10.5 K/uL   RBC 5.02 3.87 - 5.11 MIL/uL   Hemoglobin 11.5 (L) 12.0 - 15.0 g/dL   HCT 22.9 (L) 79.8 - 92.1 %   MCV 68.9 (L) 80.0 - 100.0 fL   MCH 22.9 (L) 26.0 - 34.0 pg   MCHC 33.2 30.0 - 36.0 g/dL   RDW 19.4 17.4 - 08.1 %   Platelets 304 150 - 400 K/uL   nRBC 0.0 0.0 - 0.2 %  Comprehensive metabolic panel     Status: Abnormal   Collection Time: 03/15/21  9:29 PM  Result  Value Ref Range   Sodium 136 135 - 145 mmol/L   Potassium 3.6 3.5 - 5.1 mmol/L   Chloride 107 98 - 111 mmol/L   CO2 22 22 - 32 mmol/L   Glucose, Bld 78 70 - 99 mg/dL   BUN <5 (L) 6 - 20 mg/dL   Creatinine, Ser 4.48 0.44 - 1.00 mg/dL   Calcium 9.0 8.9 - 18.5 mg/dL   Total Protein 7.1 6.5 - 8.1 g/dL   Albumin 3.9 3.5 - 5.0 g/dL   AST 17 15 - 41 U/L   ALT 11 0 - 44 U/L   Alkaline Phosphatase 42 38 - 126 U/L   Total Bilirubin 0.9 0.3 - 1.2 mg/dL   GFR, Estimated >63 >14 mL/min   Anion gap 7 5 - 15  hCG, quantitative, pregnancy     Status: Abnormal   Collection Time: 03/15/21  9:29 PM  Result Value Ref Range   hCG, Beta Chain, Quant, S 26,248 (H) <5 mIU/mL  Type and screen [facility]     Status: None   Collection Time: 03/15/21  9:29 PM  Result Value Ref Range   ABO/RH(D) O POS    Antibody Screen NEG    Sample Expiration      03/18/2021,2359 Performed at [facility], [address], [place], [place] [address]   Urinalysis, Routine w reflex microscopic Urine, Clean Catch     Status: Abnormal   Collection Time: 03/15/21 10:22 PM  Result Value Ref Range   Color, Urine YELLOW YELLOW   APPearance HAZY (A) CLEAR   Specific Gravity, Urine 1.021 1.005 - 1.030   pH 5.0 5.0 - 8.0   Glucose, UA NEGATIVE NEGATIVE mg/dL   Hgb urine dipstick NEGATIVE NEGATIVE   Bilirubin Urine NEGATIVE NEGATIVE   Ketones, ur 20 (A) NEGATIVE mg/dL   Protein, ur NEGATIVE NEGATIVE mg/dL   Nitrite NEGATIVE NEGATIVE   Leukocytes,Ua MODERATE (A) NEGATIVE   RBC / HPF 11-20 0 - 5 RBC/hpf   WBC, UA 11-20 0 - 5 WBC/hpf   Bacteria, UA FEW (A) NONE SEEN   Squamous Epithelial / LPF 6-10 0 - 5   Mucus PRESENT    US OB Transvaginal  Result Date: 03/15/2021 CLINICAL DATA:  Pregnancy of unknown anatomic location. Pregnant patient in first-trimester pregnancy with worsening abdominal pain in the  setting of Trichomonas (not treated). EXAM: TRANSVAGINAL OB ULTRASOUND TECHNIQUE: Transvaginal  ultrasound was performed for complete evaluation of the gestation as well as the maternal uterus, adnexal regions, and pelvic cul-de-sac. COMPARISON:  Obstetric ultrasound 5 days ago 03/10/2021 FINDINGS: Intrauterine gestational sac: Single Yolk sac:  Visualized. Embryo:  Questionable. Cardiac Activity: Not Visualized. MSD: 12.5 mm   6 w   0 d CRL:   1.6 mm, dating not available due to small size Subchorionic hemorrhage:  None visualized. Maternal uterus/adnexae: The uterus is retroverted. Both ovaries are visualized. Corpus luteal cyst in the left ovary. No adnexal mass. Small amount of simple free fluid in the pelvis. IMPRESSION: 1. Single intrauterine gestational sac with definite yolk sac on the current exam. The gestational sac is increased in size from prior. There is a questionable fetal pole but no cardiac activity yet demonstrated. Findings likely represent early intrauterine pregnancy. Recommend continued trending of beta HCG, and follow-up ultrasound as indicated. 2. No subchorionic hemorrhage. Electronically Signed   By: Narda RutherfordMelanie  Sanford M.D.   On: 03/15/2021 22:20    Meds ordered this encounter  Medications   metroNIDAZOLE (FLAGYL) tablet 500 mg    First dose, has outside prescription for remainder   acetaminophen (TYLENOL) tablet 1,000 mg   metroNIDAZOLE (FLAGYL) 500 MG tablet    Sig: Take 1 tablet (500 mg total) by mouth 2 (two) times daily.    Dispense:  13 tablet    Refill:  0    Order Specific Question:   Supervising Provider    Answer:   Fabio BeringDUNCAN, PAULA [[id number]]   acetaminophen (TYLENOL) 325 MG tablet    Sig: Take 2 tablets (650 mg total) by mouth every 4 (four) hours as needed for moderate pain.    Dispense:  180 tablet    Refill:  0    Order Specific Question:   Supervising Provider    Answer:   Milas HockDUNCAN, PAULA [[id number]]   Assessment and Plan  --26 y.o. [phone] with confirmed IUP  --Trichomonas positive in office --Treatment for Trichomonas initiated in MAU --Discharge home  in stable condition  Calvert CantorSamantha C Daveena Elmore, CNM 03/16/2021, 1:52 AM

## 2021-03-15 NOTE — MAU Note (Signed)
Pt c/o pain in her lower abd that radiates up to her right side. Pain has been getting worse for the past few days. Has been followed for possible ectopic pregnancy.  MSE performed by S. Reita Cliche., CNM at 2123. Labs and U/S ordered.

## 2021-03-15 NOTE — Discharge Instructions (Signed)

## 2021-03-28 ENCOUNTER — Inpatient Hospital Stay (HOSPITAL_COMMUNITY)
Admission: AD | Admit: 2021-03-28 | Discharge: 2021-03-28 | Disposition: A | Discharge status: Home / Self Care | Payer: Medicaid Other | Attending: Obstetrics and Gynecology | Admitting: Obstetrics and Gynecology

## 2021-03-28 ENCOUNTER — Encounter (HOSPITAL_COMMUNITY): Payer: Self-pay | Admitting: Obstetrics and Gynecology

## 2021-03-28 ENCOUNTER — Other Ambulatory Visit: Payer: Self-pay

## 2021-03-28 DIAGNOSIS — O23591 Infection of other part of genital tract in pregnancy, first trimester: Secondary | ICD-10-CM | POA: Diagnosis not present

## 2021-03-28 DIAGNOSIS — Z3A01 Less than 8 weeks gestation of pregnancy: Secondary | ICD-10-CM | POA: Insufficient documentation

## 2021-03-28 DIAGNOSIS — B3731 Acute candidiasis of vulva and vagina: Secondary | ICD-10-CM | POA: Diagnosis not present

## 2021-03-28 DIAGNOSIS — O26891 Other specified pregnancy related conditions, first trimester: Secondary | ICD-10-CM | POA: Diagnosis present

## 2021-03-28 DIAGNOSIS — K5900 Constipation, unspecified: Secondary | ICD-10-CM | POA: Insufficient documentation

## 2021-03-28 DIAGNOSIS — O98811 Other maternal infectious and parasitic diseases complicating pregnancy, first trimester: Secondary | ICD-10-CM | POA: Diagnosis not present

## 2021-03-28 DIAGNOSIS — N898 Other specified noninflammatory disorders of vagina: Secondary | ICD-10-CM | POA: Insufficient documentation

## 2021-03-28 DIAGNOSIS — O219 Vomiting of pregnancy, unspecified: Secondary | ICD-10-CM | POA: Diagnosis not present

## 2021-03-28 DIAGNOSIS — M545 Low back pain, unspecified: Secondary | ICD-10-CM | POA: Insufficient documentation

## 2021-03-28 DIAGNOSIS — O98311 Other infections with a predominantly sexual mode of transmission complicating pregnancy, first trimester: Secondary | ICD-10-CM | POA: Diagnosis not present

## 2021-03-28 DIAGNOSIS — O99611 Diseases of the digestive system complicating pregnancy, first trimester: Secondary | ICD-10-CM | POA: Diagnosis not present

## 2021-03-28 DIAGNOSIS — R109 Unspecified abdominal pain: Secondary | ICD-10-CM | POA: Diagnosis not present

## 2021-03-28 DIAGNOSIS — A5901 Trichomonal vulvovaginitis: Secondary | ICD-10-CM

## 2021-03-28 DIAGNOSIS — O209 Hemorrhage in early pregnancy, unspecified: Secondary | ICD-10-CM | POA: Insufficient documentation

## 2021-03-28 DIAGNOSIS — A599 Trichomoniasis, unspecified: Secondary | ICD-10-CM

## 2021-03-28 LAB — COMPREHENSIVE METABOLIC PANEL
ALT: 9 U/L (ref 0–44)
AST: 14 U/L — ABNORMAL LOW (ref 15–41)
Albumin: 3.6 g/dL (ref 3.5–5.0)
Alkaline Phosphatase: 37 U/L — ABNORMAL LOW (ref 38–126)
Anion gap: 7 (ref 5–15)
BUN: <5 mg/dL — ABNORMAL LOW (ref 6–20)
CO2: 22 mmol/L (ref 22–32)
Calcium: 8.9 mg/dL (ref 8.9–10.3)
Chloride: 106 mmol/L (ref 98–111)
Creatinine, Ser: 0.56 mg/dL (ref 0.44–1.00)
GFR, Estimated: >60 mL/min (ref >60)
Glucose, Bld: 86 mg/dL (ref 70–99)
Potassium: 3.9 mmol/L (ref 3.5–5.1)
Sodium: 135 mmol/L (ref 135–145)
Total Bilirubin: 0.6 mg/dL (ref 0.3–1.2)
Total Protein: 6.4 g/dL — ABNORMAL LOW (ref 6.5–8.1)

## 2021-03-28 LAB — URINALYSIS, ROUTINE W REFLEX MICROSCOPIC
Bilirubin Urine: NEGATIVE
Glucose, UA: NEGATIVE mg/dL
Hgb urine dipstick: NEGATIVE
Ketones, ur: NEGATIVE mg/dL
Leukocytes,Ua: NEGATIVE
Nitrite: NEGATIVE
Protein, ur: NEGATIVE mg/dL
Specific Gravity, Urine: 1.01 (ref 1.005–1.030)
pH: 8.5 — ABNORMAL HIGH (ref 5.0–8.0)

## 2021-03-28 LAB — CBC WITH DIFFERENTIAL/PLATELET
Abs Immature Granulocytes: 0.04 10*3/uL (ref 0.00–0.07)
Basophils Absolute: 0 10*3/uL (ref 0.0–0.1)
Basophils Relative: 1 %
Eosinophils Absolute: 0.1 10*3/uL (ref 0.0–0.5)
Eosinophils Relative: 1 %
HCT: 32.9 % — ABNORMAL LOW (ref 36.0–46.0)
Hemoglobin: 10.9 g/dL — ABNORMAL LOW (ref 12.0–15.0)
Immature Granulocytes: 1 %
Lymphocytes Relative: 20 %
Lymphs Abs: 1.6 10*3/uL (ref 0.7–4.0)
MCH: 22.7 pg — ABNORMAL LOW (ref 26.0–34.0)
MCHC: 33.1 g/dL (ref 30.0–36.0)
MCV: 68.4 fL — ABNORMAL LOW (ref 80.0–100.0)
Monocytes Absolute: 0.4 10*3/uL (ref 0.1–1.0)
Monocytes Relative: 5 %
Neutro Abs: 6 10*3/uL (ref 1.7–7.7)
Neutrophils Relative %: 72 %
Platelets: 280 10*3/uL (ref 150–400)
RBC: 4.81 MIL/uL (ref 3.87–5.11)
RDW: 15.1 % (ref 11.5–15.5)
WBC: 8.2 10*3/uL (ref 4.0–10.5)
nRBC: 0 % (ref 0.0–0.2)

## 2021-03-28 MED ORDER — PROMETHAZINE HCL 25 MG/ML IJ SOLN
25.0000 mg | Freq: Once | INTRAVENOUS | Status: AC
  Administered 2021-03-28: 25 mg via INTRAVENOUS
  Filled 2021-03-28: qty 1

## 2021-03-28 MED ORDER — TERCONAZOLE 0.4 % VA CREA: 1.0000 | TOPICAL_CREAM | Freq: Every day | VAGINAL | 1 refills | Status: DC

## 2021-03-28 MED ORDER — POLYETHYLENE GLYCOL 3350 17 GM/SCOOP PO POWD: 17.0000 g | Freq: Every day | ORAL | 2 refills | Status: DC | PRN

## 2021-03-28 MED ORDER — TRAMADOL HCL 50 MG PO TABS
100.0000 mg | ORAL_TABLET | Freq: Once | ORAL | Status: AC
  Administered 2021-03-28: 100 mg via ORAL
  Filled 2021-03-28: qty 2

## 2021-03-28 MED ORDER — METRONIDAZOLE 500 MG PO TABS
2000.0000 mg | ORAL_TABLET | Freq: Once | ORAL | Status: AC
  Administered 2021-03-28: 2000 mg via ORAL
  Filled 2021-03-28: qty 4

## 2021-03-28 MED ORDER — PROMETHAZINE HCL 25 MG PO TABS: 25.0000 mg | ORAL_TABLET | Freq: Four times a day (QID) | ORAL | 2 refills | Status: DC | PRN

## 2021-03-28 MED ORDER — BISACODYL 10 MG RE SUPP: 10.0000 mg | RECTAL | 0 refills | Status: DC | PRN

## 2021-03-28 NOTE — MAU Provider Note (Addendum)
Chief Complaint: Abdominal Pain   Event Date/Time   First Provider Initiated Contact with Patient 03/28/21 1534     SUBJECTIVE HPI: Alexis [name] is a 26 y.o. G3P2002 at [redacted]w[redacted]d who presents to Maternity Admissions reporting severe, worsening abd pain x 2 weeks. Was seen in MAU 03/15/21 for abd pain. IUP verified, but FP not seen. Dx Trichomonas. Rx Flagyl 500 mg PO BID but unable to keep down any doses.   Hx Sepsis due to Enteritis and Pyelonephritis in 11/22.   Location: Low and right sided able pain Quality: sharp, intermittent Severity: 10/10 on pain scale Duration: 2 weeks Context: early Pregnancy. IUP verified 03/15/21.  Timing: Intermittent Modifying factors: Nothing. Tried Tylenol w/out improvement.  Associated signs and symptoms: Neg for fever, chills, urinary complaints, diarrhea, sick contacts. Pos for constipation (NO BM X 2 WEEKS), vaginal discharge and irritation, spotting, N/V.   O pos  Past Medical History:  Diagnosis Date   Anemia    Chlamydia    Seizures ([place])    Shingles outbreak 10/2018   Trichomonas infection    OB History  Gravida Para Term Preterm AB Living  3 2 2     2   SAB IAB Ectopic Multiple Live Births        0 1    # Outcome Date GA Lbr Len/2nd Weight Sex Delivery Anes PTL Lv  3 Current           2 Term 02/08/19 [redacted]w[redacted]d 16:10 / 01:46 2705 g F Vag-Spont EPI  LIV  1 Term 10/27/09 [redacted]w[redacted]d   M Vag-Spont      Past Surgical History:  Procedure Laterality Date   NO PAST SURGERIES     Social History   Socioeconomic History   Marital status: Single    Spouse name: Not on file   Number of children: Not on file   Years of education: Not on file   Highest education level: Not on file  Occupational History   Not on file  Tobacco Use   Smoking status: Every Day    Packs/day: 0.50    Types: Cigarettes    Passive exposure: Never   Smokeless tobacco: Never  Vaping Use   Vaping Use: Never used  Substance and Sexual Activity   Alcohol use: No    Drug use: Not Currently   Sexual activity: Yes    Birth control/protection: I.U.D.    Comment: IUD Removed 02/05/21  Other Topics Concern   Not on file  Social History Narrative   Right handed   Lives with family    Social Determinants of Health   Financial Resource Strain: Not on file  Food Insecurity: Not on file  Transportation Needs: Not on file  Physical Activity: Not on file  Stress: Not on file  Social Connections: Not on file  Intimate Partner Violence: Not on file   Family History  Family history unknown: Yes   No current facility-administered medications on file prior to encounter.   Current Outpatient Medications on File Prior to Encounter  Medication Sig Dispense Refill   acetaminophen (TYLENOL) 325 MG tablet Take 2 tablets (650 mg total) by mouth every 4 (four) hours as needed for moderate pain. 180 tablet 0   Doxylamine-Pyridoxine (DICLEGIS) 10-10 MG TBEC Take 2 tabs at bedtime. If needed, add another tab in the morning. If needed, add another tab in the afternoon, up to 4 tabs/day. 100 tablet 0   No Known Allergies  I have reviewed patient's Past Medical  Hx, Surgical Hx, Family Hx, Social Hx, medications and allergies.   Review of Systems  Constitutional:  Negative for appetite change, chills and fever.  Gastrointestinal:  Positive for abdominal pain, constipation, nausea and vomiting. Negative for blood in stool and diarrhea.  Genitourinary:  Positive for vaginal bleeding and vaginal discharge. Negative for difficulty urinating, dysuria, flank pain, frequency, hematuria, pelvic pain and urgency.  Musculoskeletal:  Positive for back pain (low back).  Neurological:  Negative for seizures and weakness.   OBJECTIVE Patient Vitals for the past 24 hrs:  BP Temp Temp src Pulse Resp SpO2 Height Weight  03/28/21 2029 109/69 -- -- 80 17 100 % -- --  03/28/21 1425 127/73 98.4 F (36.9 C) Oral 93 20 100 % -- --  03/28/21 1421 -- -- -- -- -- -- 5\' 5"  (1.651 m) 71.2  kg   Constitutional: Well-developed, well-nourished female in mild distress.  Cardiovascular: normal rate Respiratory: normal rate and effort.  GI: Abd soft, mod TTP low and right sided abd, not specifically McBurney's point. Fundus non-palpable. Pos BS x 4 MS: Extremities nontender, no edema, normal ROM Neurologic: Alert and oriented x 4.  GU: Neg CVAT.  SPECULUM EXAM: Deferred due to recent exam, untreated infections.   LAB RESULTS Results for orders placed or performed during the hospital encounter of 03/28/21 (from the past 24 hour(s))  Urinalysis, Routine w reflex microscopic Urine, Clean Catch     Status: Abnormal   Collection Time: 03/28/21  4:07 PM  Result Value Ref Range   Color, Urine YELLOW YELLOW   APPearance CLEAR CLEAR   Specific Gravity, Urine 1.010 1.005 - 1.030   pH 8.5 (H) 5.0 - 8.0   Glucose, UA NEGATIVE NEGATIVE mg/dL   Hgb urine dipstick NEGATIVE NEGATIVE   Bilirubin Urine NEGATIVE NEGATIVE   Ketones, ur NEGATIVE NEGATIVE mg/dL   Protein, ur NEGATIVE NEGATIVE mg/dL   Nitrite NEGATIVE NEGATIVE   Leukocytes,Ua NEGATIVE NEGATIVE  CBC with Differential/Platelet     Status: Abnormal   Collection Time: 03/28/21  4:15 PM  Result Value Ref Range   WBC 8.2 4.0 - 10.5 K/uL   RBC 4.81 3.87 - 5.11 MIL/uL   Hemoglobin 10.9 (L) 12.0 - 15.0 g/dL   HCT 32.9 (L) 36.0 - 46.0 %   MCV 68.4 (L) 80.0 - 100.0 fL   MCH 22.7 (L) 26.0 - 34.0 pg   MCHC 33.1 30.0 - 36.0 g/dL   RDW 15.1 11.5 - 15.5 %   Platelets 280 150 - 400 K/uL   nRBC 0.0 0.0 - 0.2 %   Neutrophils Relative % 72 %   Neutro Abs 6.0 1.7 - 7.7 K/uL   Lymphocytes Relative 20 %   Lymphs Abs 1.6 0.7 - 4.0 K/uL   Monocytes Relative 5 %   Monocytes Absolute 0.4 0.1 - 1.0 K/uL   Eosinophils Relative 1 %   Eosinophils Absolute 0.1 0.0 - 0.5 K/uL   Basophils Relative 1 %   Basophils Absolute 0.0 0.0 - 0.1 K/uL   Immature Granulocytes 1 %   Abs Immature Granulocytes 0.04 0.00 - 0.07 K/uL  Comprehensive metabolic  panel     Status: Abnormal   Collection Time: 03/28/21  4:15 PM  Result Value Ref Range   Sodium 135 135 - 145 mmol/L   Potassium 3.9 3.5 - 5.1 mmol/L   Chloride 106 98 - 111 mmol/L   CO2 22 22 - 32 mmol/L   Glucose, Bld 86 70 - 99 mg/dL   BUN <  5 (L) 6 - 20 mg/dL   Creatinine, Ser 0.56 0.44 - 1.00 mg/dL   Calcium 8.9 8.9 - 10.3 mg/dL   Total Protein 6.4 (L) 6.5 - 8.1 g/dL   Albumin 3.6 3.5 - 5.0 g/dL   AST 14 (L) 15 - 41 U/L   ALT 9 0 - 44 U/L   Alkaline Phosphatase 37 (L) 38 - 126 U/L   Total Bilirubin 0.6 0.3 - 1.2 mg/dL   GFR, Estimated >60 >60 mL/min   Anion gap 7 5 - 15    IMAGING Pt informed that the ultrasound is considered a limited OB ultrasound and is not intended to be a complete ultrasound exam.  Patient also informed that the ultrasound is not being completed with the intent of assessing for fetal or placental anomalies or any pelvic abnormalities.  Explained that the purpose of todays ultrasound is to assess for  viability.  Patient acknowledges the purpose of the exam and the limitations of the study.  FHR 138.    MAU COURSE Orders Placed This Encounter  Procedures   Culture, OB Urine   CBC with Differential/Platelet   Comprehensive metabolic panel   Urinalysis, Routine w reflex microscopic Urine, Clean Catch   Diet NPO time specified   Apply warm compress   Soap suds enema   Insert peripheral IV   Discharge patient   Meds ordered this encounter  Medications   promethazine (PHENERGAN) 25 mg in lactated ringers 1,000 mL infusion   traMADol (ULTRAM) tablet 100 mg   metroNIDAZOLE (FLAGYL) tablet 2,000 mg   promethazine (PHENERGAN) 25 MG tablet    Sig: Take 1 tablet (25 mg total) by mouth every 6 (six) hours as needed for nausea or vomiting.    Dispense:  30 tablet    Refill:  2    Order Specific Question:   Supervising Provider    Answer:   Llana Aliment   polyethylene glycol powder (GLYCOLAX/MIRALAX) 17 GM/SCOOP powder    Sig: Take 17 g by  mouth daily as needed for moderate constipation.    Dispense:  500 g    Refill:  2    Order Specific Question:   Supervising Provider    Answer:   Radene Gunning [id number]   bisacodyl (DULCOLAX) 10 MG suppository    Sig: Place 1 suppository (10 mg total) rectally as needed for moderate constipation.    Dispense:  12 suppository    Refill:  0    Order Specific Question:   Supervising Provider    Answer:   Llana Aliment   terconazole (TERAZOL 7) 0.4 % vaginal cream    Sig: Place 1 applicator vaginally at bedtime.    Dispense:  45 g    Refill:  1    Order Specific Question:   Supervising Provider    Answer:   Radene Gunning [id number]    MAU Course Soapsuds enema performed. Moderate BM. Pain much better.  Nausea resolved w/ Phenergan. Able to keep down crackers, Ultram and Flagyl.   MDM - Abd pain due to constipation: Mod BM w/ enema. D/C home with Miralax and Dulcolax. Increase fluids and fiber. Live IUP verified. Low suspicion for appendicitis due to location of pain and absence of fever, leukocytosis and resolution w/ BM.  - Utreated Trichomonas. Flagyl given in MAU.  - Untreated symptomatic yeast infection. Rx Terazol.  - N/V pregnancy. Fill Diclegis Rx. Phenergan Rx for exacerbations.   ASSESSMENT 1. Abdominal pain during pregnancy, first trimester  2. Constipation during pregnancy in first trimester   3. Nausea and vomiting in pregnancy   4. Trichomonal vaginitis during pregnancy in first trimester   5. Vaginal yeast infection   6. Trichomonas infection     PLAN Discharge home in stable condition. First trimester and abd pain precautions Increase fluids and fiber Allergies as of 03/28/2021   No Known Allergies      Medication List     STOP taking these medications    metoCLOPramide 10 MG tablet Commonly known as: Reglan   metroNIDAZOLE 500 MG tablet Commonly known as: FLAGYL       TAKE these medications    acetaminophen 325 MG tablet Commonly  known as: Tylenol Take 2 tablets (650 mg total) by mouth every 4 (four) hours as needed for moderate pain.   bisacodyl 10 MG suppository Commonly known as: Dulcolax Place 1 suppository (10 mg total) rectally as needed for moderate constipation.   Doxylamine-Pyridoxine 10-10 MG Tbec Commonly known as: Diclegis Take 2 tabs at bedtime. If needed, add another tab in the morning. If needed, add another tab in the afternoon, up to 4 tabs/day.   polyethylene glycol powder 17 GM/SCOOP powder Commonly known as: GLYCOLAX/MIRALAX Take 17 g by mouth daily as needed for moderate constipation.   promethazine 25 MG tablet Commonly known as: PHENERGAN Take 1 tablet (25 mg total) by mouth every 6 (six) hours as needed for nausea or vomiting.   terconazole 0.4 % vaginal cream Commonly known as: TERAZOL 7 Place 1 applicator vaginally at bedtime.       Tamala Julian, [place], [place] 03/28/2021  10:28 PM

## 2021-03-28 NOTE — MAU Note (Signed)
Alexis [name] is a 26 y.o. at [redacted]w[redacted]d here in MAU reporting: ongoing pain since first MAU visit. States it is to the point where she cannot even have a bowel movement, last BM was 2 weeks ago. Had some spotting 1-2 days ago. Has been unable to keep down the treatment for trich.    Onset of complaint: ongoing  Pain score: 10/10  Vitals:   03/28/21 1425  BP: 127/73  Pulse: 93  Resp: 20  Temp: 98.4 F (36.9 C)  SpO2: 100%     Lab orders placed from triage: UA

## 2021-03-28 NOTE — MAU Note (Signed)
Pt called out, states she had a successful BM after holding enema for about 10 min.  States she feels better, but is still nauseous.  IV phen. Started

## 2021-03-29 ENCOUNTER — Ambulatory Visit: Admission: RE | Admit: 2021-03-29 | Payer: Medicaid Other | Source: Ambulatory Visit

## 2021-03-29 LAB — CULTURE, OB URINE: Special Requests: NORMAL

## 2021-03-31 ENCOUNTER — Telehealth: Payer: Self-pay

## 2021-03-31 ENCOUNTER — Telehealth (HOSPITAL_BASED_OUTPATIENT_CLINIC_OR_DEPARTMENT_OTHER): Payer: Self-pay | Admitting: Obstetrics & Gynecology

## 2021-03-31 NOTE — Telephone Encounter (Signed)
Spoke with patient about rescheduling her ultrasound appointment, and then getting an appointment at the [facility] office.

## 2021-03-31 NOTE — Telephone Encounter (Signed)
Called patient and her number was unavailable at this time.I called her mom that's listed  in her contacts and left a detail message to please call our office to setup her OB appointments with Dr.Mary Hyacinth Meeker at [facility] at [facility]

## 2021-04-06 ENCOUNTER — Inpatient Hospital Stay (HOSPITAL_COMMUNITY): Payer: Medicaid Other

## 2021-04-06 ENCOUNTER — Inpatient Hospital Stay (HOSPITAL_COMMUNITY)
Admission: AD | Admit: 2021-04-06 | Discharge: 2021-04-06 | Disposition: A | Discharge status: Home / Self Care | Payer: Medicaid Other | Attending: Obstetrics & Gynecology | Admitting: Obstetrics & Gynecology

## 2021-04-06 ENCOUNTER — Other Ambulatory Visit: Payer: Self-pay

## 2021-04-06 ENCOUNTER — Encounter (HOSPITAL_COMMUNITY): Payer: Self-pay | Admitting: Obstetrics & Gynecology

## 2021-04-06 DIAGNOSIS — R110 Nausea: Secondary | ICD-10-CM

## 2021-04-06 DIAGNOSIS — Z630 Problems in relationship with spouse or partner: Secondary | ICD-10-CM | POA: Insufficient documentation

## 2021-04-06 DIAGNOSIS — Z3490 Encounter for supervision of normal pregnancy, unspecified, unspecified trimester: Secondary | ICD-10-CM | POA: Diagnosis not present

## 2021-04-06 DIAGNOSIS — R109 Unspecified abdominal pain: Secondary | ICD-10-CM | POA: Insufficient documentation

## 2021-04-06 DIAGNOSIS — O3680X0 Pregnancy with inconclusive fetal viability, not applicable or unspecified: Secondary | ICD-10-CM | POA: Diagnosis present

## 2021-04-06 DIAGNOSIS — Z3A08 8 weeks gestation of pregnancy: Secondary | ICD-10-CM | POA: Insufficient documentation

## 2021-04-06 DIAGNOSIS — O26891 Other specified pregnancy related conditions, first trimester: Secondary | ICD-10-CM | POA: Diagnosis not present

## 2021-04-06 LAB — CBC WITH DIFFERENTIAL/PLATELET
Abs Immature Granulocytes: 0.04 10*3/uL (ref 0.00–0.07)
Basophils Absolute: 0 10*3/uL (ref 0.0–0.1)
Basophils Relative: 0 %
Eosinophils Absolute: 0.1 10*3/uL (ref 0.0–0.5)
Eosinophils Relative: 1 %
HCT: 32.2 % — ABNORMAL LOW (ref 36.0–46.0)
Hemoglobin: 10.8 g/dL — ABNORMAL LOW (ref 12.0–15.0)
Immature Granulocytes: 1 %
Lymphocytes Relative: 11 %
Lymphs Abs: 0.9 10*3/uL (ref 0.7–4.0)
MCH: 22.7 pg — ABNORMAL LOW (ref 26.0–34.0)
MCHC: 33.5 g/dL (ref 30.0–36.0)
MCV: 67.8 fL — ABNORMAL LOW (ref 80.0–100.0)
Monocytes Absolute: 0.4 10*3/uL (ref 0.1–1.0)
Monocytes Relative: 5 %
Neutro Abs: 6.7 10*3/uL (ref 1.7–7.7)
Neutrophils Relative %: 82 %
Platelets: 305 10*3/uL (ref 150–400)
RBC: 4.75 MIL/uL (ref 3.87–5.11)
RDW: 15.4 % (ref 11.5–15.5)
WBC: 8.2 10*3/uL (ref 4.0–10.5)
nRBC: 0 % (ref 0.0–0.2)

## 2021-04-06 LAB — HCG, QUANTITATIVE, PREGNANCY: hCG, Beta Chain, Quant, S: 150277 m[IU]/mL — ABNORMAL HIGH (ref <5)

## 2021-04-06 MED ORDER — METOCLOPRAMIDE HCL 10 MG PO TABS
10.0000 mg | ORAL_TABLET | Freq: Once | ORAL | Status: AC
  Administered 2021-04-06: 10 mg via ORAL
  Filled 2021-04-06: qty 1

## 2021-04-06 MED ORDER — CYCLOBENZAPRINE HCL 5 MG PO TABS
10.0000 mg | ORAL_TABLET | Freq: Once | ORAL | Status: AC
  Administered 2021-04-06: 10 mg via ORAL
  Filled 2021-04-06: qty 2

## 2021-04-06 MED ORDER — ACETAMINOPHEN 500 MG PO TABS
1000.0000 mg | ORAL_TABLET | Freq: Once | ORAL | Status: AC
Start: 2021-04-06 — End: 2021-04-06
  Administered 2021-04-06: 1000 mg via ORAL
  Filled 2021-04-06: qty 2
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
## 2021-04-06 NOTE — Discharge Instructions (Signed)
Safe Medications in Pregnancy    Acne: Benzoyl Peroxide Salicylic Acid  Backache/Headache: Tylenol: 2 regular strength every 4 hours OR              2 Extra strength every 6 hours  Colds/Coughs/Allergies: Benadryl (alcohol free) 25 mg every 6 hours as needed Breath right strips Claritin Cepacol throat lozenges Chloraseptic throat spray Cold-Eeze- up to three times per day Cough drops, alcohol free Flonase (by prescription only) Guaifenesin Mucinex Robitussin DM (plain only, alcohol free) Saline nasal spray/drops Sudafed (pseudoephedrine) & Actifed ** use only after [redacted] weeks gestation and if you do not have high blood pressure Tylenol Vicks Vaporub Zinc lozenges Zyrtec   Constipation: Colace Ducolax suppositories Fleet enema Glycerin suppositories Metamucil Milk of magnesia Miralax Senokot Smooth move tea  Diarrhea: Kaopectate Imodium A-D  *NO pepto Bismol  Hemorrhoids: Anusol Anusol HC Preparation H Tucks  Indigestion: Tums Maalox Mylanta Zantac  Pepcid  Insomnia: Benadryl (alcohol free) 25mg every 6 hours as needed Tylenol PM Unisom, no Gelcaps  Leg Cramps: Tums MagGel  Nausea/Vomiting:  Bonine Dramamine Emetrol Ginger extract Sea bands Meclizine  Nausea medication to take during pregnancy:  Unisom (doxylamine succinate 25 mg tablets) Take one tablet daily at bedtime. If symptoms are not adequately controlled, the dose can be increased to a maximum recommended dose of two tablets daily (1/2 tablet in the morning, 1/2 tablet mid-afternoon and one at bedtime). Vitamin B6 100mg tablets. Take one tablet twice a day (up to 200 mg per day).  Skin Rashes: Aveeno products Benadryl cream or 25mg every 6 hours as needed Calamine Lotion 1% cortisone cream  Yeast infection: Gyne-lotrimin 7 Monistat 7   **If taking multiple medications, please check labels to avoid duplicating the same active ingredients **take  medication as directed on the label ** Do not exceed 4000 mg of tylenol in 24 hours **Do not take medications that contain aspirin or ibuprofen         Prenatal Care Providers           [facility] - accepts patients without insurance  Phone: [phone]  [facility]   Phone: [phone]  [facility]       Phone: [phone]            [facility] @ [place]     Phone: [phone]          [facility] [place]   Phone: [phone]  [facility] - accepts patients without insurance  Phone: [phone]  [facility] Phone: [phone]     [facility] - accepts patients without insurance Phone: [phone]  [facility]  Phone: [phone]  [facility] Phone: [phone]  [facility] Phone: [phone]  [facility] Phone: [phone]  [facility] Phone: [phone]  [facility]  Phone: [phone]  
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
## 2021-04-07 ENCOUNTER — Ambulatory Visit: Payer: Medicaid Other

## 2021-04-19 ENCOUNTER — Encounter (HOSPITAL_BASED_OUTPATIENT_CLINIC_OR_DEPARTMENT_OTHER): Payer: Self-pay | Admitting: Obstetrics & Gynecology

## 2021-04-19 ENCOUNTER — Other Ambulatory Visit: Payer: Self-pay

## 2021-04-19 ENCOUNTER — Other Ambulatory Visit (HOSPITAL_COMMUNITY)
Admission: RE | Admit: 2021-04-19 | Discharge: 2021-04-19 | Disposition: A | Discharge status: Home / Self Care | Payer: Medicaid Other | Source: Ambulatory Visit | Attending: Obstetrics & Gynecology | Admitting: Obstetrics & Gynecology

## 2021-04-19 ENCOUNTER — Ambulatory Visit (INDEPENDENT_AMBULATORY_CARE_PROVIDER_SITE_OTHER): Payer: Medicaid Other | Admitting: Obstetrics & Gynecology

## 2021-04-19 ENCOUNTER — Ambulatory Visit (INDEPENDENT_AMBULATORY_CARE_PROVIDER_SITE_OTHER): Payer: Medicaid Other | Admitting: *Deleted

## 2021-04-19 ENCOUNTER — Encounter (HOSPITAL_BASED_OUTPATIENT_CLINIC_OR_DEPARTMENT_OTHER): Payer: Self-pay | Admitting: *Deleted

## 2021-04-19 VITALS — BP 107/71 | HR 92 | Wt 156.9 lb

## 2021-04-19 VITALS — BP 107/71 | HR 92 | Ht 65.0 in | Wt 156.6 lb

## 2021-04-19 DIAGNOSIS — F17200 Nicotine dependence, unspecified, uncomplicated: Secondary | ICD-10-CM

## 2021-04-19 DIAGNOSIS — Z3A10 10 weeks gestation of pregnancy: Secondary | ICD-10-CM | POA: Diagnosis not present

## 2021-04-19 DIAGNOSIS — Z8619 Personal history of other infectious and parasitic diseases: Secondary | ICD-10-CM | POA: Insufficient documentation

## 2021-04-19 DIAGNOSIS — Z3481 Encounter for supervision of other normal pregnancy, first trimester: Secondary | ICD-10-CM | POA: Diagnosis not present

## 2021-04-19 DIAGNOSIS — O09899 Supervision of other high risk pregnancies, unspecified trimester: Secondary | ICD-10-CM | POA: Insufficient documentation

## 2021-04-19 DIAGNOSIS — O0990 Supervision of high risk pregnancy, unspecified, unspecified trimester: Secondary | ICD-10-CM | POA: Insufficient documentation

## 2021-04-19 DIAGNOSIS — O09891 Supervision of other high risk pregnancies, first trimester: Secondary | ICD-10-CM

## 2021-04-19 DIAGNOSIS — O0991 Supervision of high risk pregnancy, unspecified, first trimester: Secondary | ICD-10-CM

## 2021-04-19 DIAGNOSIS — O99011 Anemia complicating pregnancy, first trimester: Secondary | ICD-10-CM

## 2021-04-19 DIAGNOSIS — Z2839 Other underimmunization status: Secondary | ICD-10-CM

## 2021-04-19 DIAGNOSIS — G40909 Epilepsy, unspecified, not intractable, without status epilepticus: Secondary | ICD-10-CM | POA: Insufficient documentation

## 2021-04-19 LAB — POCT URINALYSIS DIPSTICK OB
Appearance: NORMAL
Bilirubin, UA: NEGATIVE
Blood, UA: NEGATIVE
Glucose, UA: NEGATIVE
Ketones, UA: NEGATIVE
Leukocytes, UA: NEGATIVE
Nitrite, UA: NEGATIVE
POC,PROTEIN,UA: NEGATIVE
Spec Grav, UA: 1.025 (ref 1.010–1.025)
Urobilinogen, UA: 0.2 E.U./dL
pH, UA: 6 (ref 5.0–8.0)

## 2021-04-19 LAB — HEPATITIS C ANTIBODY: HCV Ab: NEGATIVE

## 2021-04-19 MED ORDER — BLOOD PRESSURE KIT DEVI: 1.0000 | Freq: Once | 0 refills | Status: DC

## 2021-04-19 MED ORDER — PRENATAL PLUS VITAMIN/MINERAL 27-1 MG PO TABS: 1.0000 | ORAL_TABLET | Freq: Every day | ORAL | 11 refills | Status: DC

## 2021-04-19 MED ORDER — BLOOD PRESSURE KIT DEVI: 1.0000 | Freq: Once | 0 refills | Status: AC

## 2021-04-19 NOTE — Progress Notes (Signed)
New OB Intake    I explained I am completing New OB Intake today. We discussed her EDD of 11/13/21 that is based on u/s done 2/14 showing 8+4. Pt is G3/P2. I reviewed her allergies, medications, Medical/Surgical/OB history, and appropriate screenings. I informed her of [facility] services. Based on history, this is a/an  pregnancy uncomplicated .   Patient Active Problem List   Diagnosis Date Noted   Supervision of high risk pregnancy, antepartum 04/19/2021   History of trichomoniasis 04/19/2021   Seizure disorder (HCC) 04/19/2021   Smoker 04/19/2021   History of gonorrhea 04/19/2021   Depression 12/31/2020    Concerns addressed today  Delivery Plans:  Plans to deliver at [facility].   MyChart/Babyscripts MyChart access verified. I explained pt will have some visits in office and some virtually. Babyscripts instructions given and order placed. Patient verifies receipt of registration text/e-mail. Account successfully created and app downloaded.  Blood Pressure Cuff  Blood pressure cuff ordered for patient to pick-up from [employer]. Explained after first prenatal appt pt will check weekly and document in Babyscripts.  Weight scale: Patient does / does not  have weight scale. Weight scale ordered for patient to pick up from [employer].   Anatomy US Explained first scheduled [place] will be around 19 weeks. Anatomy US scheduled for 06/18/21 at 1045. Pt notified to arrive at 1015. .   Labs Discussed Avelina Laine genetic screening with patient. Would like both Panorama and Horizon drawn at new OB visit.Also if interested in genetic testing, tell patient she will need AFP 15-21 weeks to complete genetic testing .Routine prenatal labs needed.  Covid Vaccine Patient has not covid vaccine.   Informed patient of [facility] website  and placed link in her AVS.     Placed OB Box on problem list and updated  First visit review I reviewed new OB appt with pt. I explained she will have a  pelvic exam, ob bloodwork with genetic screening, and PAP smear. Explained pt will be seen by Dr.Miller at first visit; encounter routed to appropriate provider. Explained that patient will be seen by pregnancy navigator following visit with provider  Harrie Jeans, RN 04/19/2021  11:06 AM

## 2021-04-19 NOTE — Progress Notes (Signed)
History:   Alexis [name] is a 26 y.o. G3P2002 at [redacted]w[redacted]d by LMP being seen today for her first obstetrical visit.  Her obstetrical history is significant for smoker. Patient is considering breast and bottle feeding.  Pregnancy history fully reviewed.  Patient reports no complaints.     HISTORY: OB History  Gravida Para Term Preterm AB Living  3 2 2  0 0 2  SAB IAB Ectopic Multiple Live Births  0 0 0 0 1    # Outcome Date GA Lbr Len/2nd Weight Sex Delivery Anes PTL Lv  3 Current           2 Term 02/08/19 [redacted]w[redacted]d 16:10 / 01:46 5 lb 15.4 oz (2.705 kg) F Vag-Spont EPI  LIV     Name: Medford,GIRL Lux     Apgar1: 8  Apgar5: 9  1 Term 10/27/09 [redacted]w[redacted]d   M Vag-Spont       Pt thinks she's had a pap smear but I cannot find this in Epic.  Obtained today.  Past Medical History:  Diagnosis Date   Beta thalassemia trait    Family history of breast cancer in mother    Gonorrhea 12/2020   treated   Seizures ([place])    last seizure in 2021   Shingles outbreak 10/2018   Trichomonas infection 02/2021   Past Surgical History:  Procedure Laterality Date   NO PAST SURGERIES     Family History  Problem Relation Age of Onset   Cancer Mother 92       breast   Heart disease Father    Heart disease Paternal Grandmother    Heart disease Paternal Grandfather    Social History   Tobacco Use   Smoking status: Every Day    Packs/day: 0.25    Types: Cigarettes    Passive exposure: Never   Smokeless tobacco: Never  Vaping Use   Vaping Use: Never used  Substance Use Topics   Alcohol use: No   Drug use: Not Currently   No Known Allergies Current Outpatient Medications on File Prior to Visit  Medication Sig Dispense Refill   Doxylamine-Pyridoxine (DICLEGIS) 10-10 MG TBEC Take 2 tabs at bedtime. If needed, add another tab in the morning. If needed, add another tab in the afternoon, up to 4 tabs/day. (Patient not taking: Reported on 04/19/2021) 100 tablet 0   polyethylene glycol powder  (GLYCOLAX/MIRALAX) 17 GM/SCOOP powder Take 17 g by mouth daily as needed for moderate constipation. 500 g 2   promethazine (PHENERGAN) 25 MG tablet Take 1 tablet (25 mg total) by mouth every 6 (six) hours as needed for nausea or vomiting. 30 tablet 2   No current facility-administered medications on file prior to visit.    Review of Systems Pertinent items noted in HPI and remainder of comprehensive ROS otherwise negative.  1. Supervision of high risk pregnancy in first trimester - on PNV  2. [redacted] weeks gestation of pregnancy - CBC - Hepatitis B surface antigen - HIV Antibody (routine testing w rflx) - HIV (Save tube for possible reflex) - RPR - Rubella screen - Hepatitis C antibody - Urine Culture - Cytology - PAP( [place]) - Ambulatory referral to [place] Scre[facility]  3. Seizure disorder ([place]) - Reached out to neurologist, Dr. Delice Lesch to ensure no follow up in pregnancy needed - h/o one seizure 12/2019 with negative neurologic eval.  Was on Topamax but stopped about 6 months ago  4. History of trichomoniasis - repeat  testing obtained today  5. History of gonorrhea - repeat testing obtained today  6. Smoker - pt down to 4-5 cigarettes a day.  Highly encouraged cessation.    7.  Positive depression screening - referral to integrative behavioral health made today   Bedside Ultrasound for FHR check: Viable intrauterine pregnancy with positive cardiac activity noted, fetal heart rate 166bpm Patient informed that the ultrasound is considered a limited obstetric ultrasound and is not intended to be a complete ultrasound exam.  Patient also informed that the ultrasound is not being completed with the intent of assessing for fetal or placental anomalies or any pelvic abnormalities.  Explained that the purpose of todays ultrasound is to assess for fetal heart rate.  Patient acknowledges the purpose of the exam and the limitations of the  study. General: well-developed, well-nourished female in no acute distress  Breasts:  normal appearance, no masses or tenderness bilaterally  Skin: normal coloration and turgor, no rashes  Neurologic: oriented, normal, negative, normal mood  Extremities: normal strength, tone, and muscle mass, ROM of all joints is normal  HEENT PERRLA, extraocular movement intact and sclera clear, anicteric  Neck supple and no masses  Cardiovascular: regular rate and rhythm  Respiratory:  no respiratory distress, normal breath sounds  Abdomen: soft, non-tender; bowel sounds normal; no masses,  no organomegaly  Pelvic: normal external genitalia, no lesions, normal vaginal mucosa, normal vaginal discharge, normal cervix, pap smear done. Uterine size:   10 weeks, full    Assessment:    Pregnancy: [id number] Patient Active Problem List   Diagnosis Date Noted   Supervision of other normal pregnancy, antepartum 04/19/2021   History of trichomoniasis 04/19/2021   Seizure disorder ([place]) 04/19/2021   Smoker 04/19/2021   Trichomonas infection 03/15/2021   Depression 12/31/2020     Plan:    1. [redacted] weeks gestation of pregnancy - on PNV - CBC - Hepatitis B surface antigen - HIV Antibody (routine testing w rflx) - HIV (Save tube for possible reflex) - RPR - Rubella screen - Hepatitis C antibody - Urine Culture - Cytology - PAP( [place]) - Ambulatory referral to [facility] Scre[facility]  2. Seizure disorder [facility]) - sent message to Dr. Delice Lesch about need for follow up  3. History of trichomoniasis - Trichomonas  testing done today  4. History of gonorrhea - GC/Chl repeated  5. Smoker - pt smoking 4-5 cigarettes a day and is trying to quit   Initial labs drawn. Continue prenatal vitamins. Problem list reviewed and updated. Genetic Screening discussed, NIPS: ordered. Ultrasound discussed; fetal anatomic survey: ordered. Anticipatory guidance about prenatal visits given  including labs, ultrasounds, and testing. Discussed usage of Babyscripts and virtual visits as additional source of managing and completing prenatal visits in midst of coronavirus and pandemic.   Encouraged to complete MyChart Registration for her ability to review results, send requests, and have questions addressed.  The nature of [place] for [facility]Faculty Practice with multiple MDs and Advanced Practice Providers was explained to patient; also emphasized that residents, students are part of our team. Routine obstetric precautions reviewed. Encouraged to seek out care at office or emergency room [facility] MAU preferred) for urgent and/or emergent concerns. Return in about 4 weeks (around 05/17/2021).     Felipa Emory, MD, [place], [facility] for [facility], [place]

## 2021-04-20 DIAGNOSIS — O99011 Anemia complicating pregnancy, first trimester: Secondary | ICD-10-CM | POA: Insufficient documentation

## 2021-04-20 DIAGNOSIS — O99019 Anemia complicating pregnancy, unspecified trimester: Secondary | ICD-10-CM | POA: Insufficient documentation

## 2021-04-20 DIAGNOSIS — O99013 Anemia complicating pregnancy, third trimester: Secondary | ICD-10-CM | POA: Insufficient documentation

## 2021-04-20 DIAGNOSIS — O09899 Supervision of other high risk pregnancies, unspecified trimester: Secondary | ICD-10-CM | POA: Insufficient documentation

## 2021-04-20 DIAGNOSIS — Z2839 Other underimmunization status: Secondary | ICD-10-CM | POA: Insufficient documentation

## 2021-04-20 LAB — HEPATITIS C ANTIBODY: Hep C Virus Ab: NONREACTIVE

## 2021-04-20 LAB — CBC
Hematocrit: 31.1 % — ABNORMAL LOW (ref 34.0–46.6)
Hemoglobin: 10.2 g/dL — ABNORMAL LOW (ref 11.1–15.9)
MCH: 22.6 pg — ABNORMAL LOW (ref 26.6–33.0)
MCHC: 32.8 g/dL (ref 31.5–35.7)
MCV: 69 fL — ABNORMAL LOW (ref 79–97)
Platelets: 347 10*3/uL (ref 150–450)
RBC: 4.51 x10E6/uL (ref 3.77–5.28)
RDW: 15.8 % — ABNORMAL HIGH (ref 11.7–15.4)
WBC: 9.5 10*3/uL (ref 3.4–10.8)

## 2021-04-20 LAB — HIV ANTIBODY (ROUTINE TESTING W REFLEX): HIV Screen 4th Generation wRfx: NONREACTIVE

## 2021-04-20 LAB — RUBELLA SCREEN: Rubella Antibodies, IGG: <0.9 index — ABNORMAL LOW (ref >0.99)

## 2021-04-20 LAB — HEPATITIS B SURFACE ANTIGEN: Hepatitis B Surface Ag: NEGATIVE

## 2021-04-20 LAB — RPR: RPR Ser Ql: NONREACTIVE

## 2021-04-21 LAB — URINE CULTURE

## 2021-04-21 LAB — CYTOLOGY - PAP
Chlamydia: NEGATIVE
Comment: NEGATIVE
Comment: NEGATIVE
Comment: NORMAL
Neisseria Gonorrhea: NEGATIVE
Trichomonas: NEGATIVE

## 2021-04-22 ENCOUNTER — Other Ambulatory Visit: Payer: Self-pay

## 2021-04-22 ENCOUNTER — Inpatient Hospital Stay (HOSPITAL_COMMUNITY)
Admission: EM | Admit: 2021-04-22 | Discharge: 2021-04-22 | Disposition: A | Discharge status: Home / Self Care | Payer: Medicaid Other | Attending: Obstetrics & Gynecology | Admitting: Obstetrics & Gynecology

## 2021-04-22 ENCOUNTER — Encounter (HOSPITAL_BASED_OUTPATIENT_CLINIC_OR_DEPARTMENT_OTHER): Payer: Medicaid Other | Admitting: Obstetrics & Gynecology

## 2021-04-22 ENCOUNTER — Telehealth (HOSPITAL_BASED_OUTPATIENT_CLINIC_OR_DEPARTMENT_OTHER): Payer: Self-pay | Admitting: *Deleted

## 2021-04-22 ENCOUNTER — Encounter (HOSPITAL_COMMUNITY): Payer: Self-pay

## 2021-04-22 DIAGNOSIS — Y92009 Unspecified place in unspecified non-institutional (private) residence as the place of occurrence of the external cause: Secondary | ICD-10-CM | POA: Insufficient documentation

## 2021-04-22 DIAGNOSIS — O26891 Other specified pregnancy related conditions, first trimester: Secondary | ICD-10-CM | POA: Insufficient documentation

## 2021-04-22 DIAGNOSIS — R109 Unspecified abdominal pain: Secondary | ICD-10-CM

## 2021-04-22 DIAGNOSIS — O26899 Other specified pregnancy related conditions, unspecified trimester: Secondary | ICD-10-CM

## 2021-04-22 DIAGNOSIS — W19XXXA Unspecified fall, initial encounter: Secondary | ICD-10-CM | POA: Diagnosis not present

## 2021-04-22 DIAGNOSIS — Z3A10 10 weeks gestation of pregnancy: Secondary | ICD-10-CM | POA: Diagnosis not present

## 2021-04-22 MED ORDER — ACETAMINOPHEN 500 MG PO TABS
1000.0000 mg | ORAL_TABLET | Freq: Once | ORAL | Status: AC
  Administered 2021-04-22: 1000 mg via ORAL
  Filled 2021-04-22: qty 2

## 2021-04-22 NOTE — Telephone Encounter (Signed)
Pt called in, crying, stating that she is having severe pain in her upper abdomen on the right side. She describes it as a constant stabbing pain that is not getting any better. Pt provided with appt for evaluation.  ?

## 2021-04-22 NOTE — ED Provider Notes (Signed)
?[facility] ?Provider Note ? ? ?CSN: [id number] ?Arrival date & time: 04/22/21  1507 ? ?  ? ?History ? ?Chief Complaint  ?Patient presents with  ? Fall  ? ? ?Alexis [name] is a 26 y.o. female. ? ? ?Fall ? ?Patient is a 27 year old female states she is ~10-[redacted] weeks pregnant. States that she's been very tired with her pregnancy and nauseous (no vomiting) and states she walked through her house today just before arrival and her legs gave out causing her to fall onto her R abdomen - she struck a small doggy-door on the way down to the ground. She states she hasn't noticed any bruising but came to the ER because she wanted to confirm things were okay.  ? ?Had [place] previously she states but tells me she was told it was early to tell much ? ? ?  ? ?Home Medications ?Prior to Admission medications   ?Medication Sig Start Date End Date Taking? Authorizing Provider  ?Doxylamine-Pyridoxine (DICLEGIS) 10-10 MG TBEC Take 2 tabs at bedtime. If needed, add another tab in the morning. If needed, add another tab in the afternoon, up to 4 tabs/day. ?Patient not taking: Reported on 04/19/2021 03/10/21   [name], DO  ?polyethylene glycol powder (GLYCOLAX/MIRALAX) 17 GM/SCOOP powder Take 17 g by mouth daily as needed for moderate constipation. 03/28/21   Tamala Julian, [place], [place]  ?Prenatal Vit-Fe Fumarate-FA (PRENATAL PLUS VITAMIN/MINERAL) 27-1 MG TABS Take 1 tablet by mouth daily. 04/19/21   Megan Salon, MD  ?promethazine (PHENERGAN) 25 MG tablet Take 1 tablet (25 mg total) by mouth every 6 (six) hours as needed for nausea or vomiting. 03/28/21   Tamala Julian, [place], CNM  ?   ? ?Allergies    ?Patient has no known allergies.   ? ?Review of Systems   ?Review of Systems ? ?Physical Exam ?Updated Vital Signs ?BP 111/61 (BP Location: Right Arm)   Pulse 73   Temp 98.1 ?F (36.7 ?C) (Oral)   Resp 19   Ht 5\' 5"  (1.651 m)   Wt 70.8 kg   LMP 02/05/2021   SpO2 100%   BMI 25.96 kg/m?  ?Physical  Exam ?Vitals and nursing note reviewed.  ?Constitutional:   ?   General: She is not in acute distress. ?HENT:  ?   Head: Normocephalic and atraumatic.  ?   Nose: Nose normal.  ?Eyes:  ?   General: No scleral icterus. ?Cardiovascular:  ?   Rate and Rhythm: Normal rate and regular rhythm.  ?   Pulses: Normal pulses.  ?   Heart sounds: Normal heart sounds.  ?Pulmonary:  ?   Effort: Pulmonary effort is normal. No respiratory distress.  ?   Breath sounds: No wheezing.  ?Abdominal:  ?   Palpations: Abdomen is soft.  ?   Tenderness: There is no abdominal tenderness.  ?   Comments: Abd gravid, soft NTTP ?No bruising  ?Musculoskeletal:  ?   Cervical back: Normal range of motion.  ?   Right lower leg: No edema.  ?   Left lower leg: No edema.  ?Skin: ?   General: Skin is warm and dry.  ?   Capillary Refill: Capillary refill takes less than 2 seconds.  ?Neurological:  ?   Mental Status: She is alert. Mental status is at baseline.  ?Psychiatric:     ?   Mood and Affect: Mood normal.     ?   Behavior: Behavior normal.  ? ? ?  ED Results / Procedures / Treatments   ?Labs ?(all labs ordered are listed, but only abnormal results are displayed) ?Labs Reviewed - No data to display ? ?EKG ?None ? ?Radiology ?No results found. ? ?Procedures ?Ultrasound ED Abd ? ?Date/Time: 04/22/2021 5:26 PM ?Performed by: Tedd Sias, PA ?Authorized by: Tedd Sias, PA  ? ?Procedure details:  ?  Indications: abdominal pain   ?  Scope of abdominal ultrasound: IU pregnancy.  ?  Images: archived (uterus - IUP) ?   ?Comments:  ?    CR measurement consistent with 11w age w is consistent w patient story. Fetal HR ~160.   ? ? ? ? ? ?Medications Ordered in ED ?Medications  ?acetaminophen (TYLENOL) tablet 1,000 mg (1,000 mg Oral Given 04/22/21 1616)  ? ? ?ED Course/ Medical Decision Making/ A&P ?Clinical Course as of 04/28/21 1134  ?Thu Apr 22, 2021  ?1525 Legs gave out and she fell onto her right side onto a dog gate. This deflected her onto her back.   ?This occurred at 1:50 pm.  ? ?No vomiting or diarrhea.  ? [WF]  ?  ?Clinical Course User Index ?[WF] Tedd Sias, [place]  ? ?                        ?Medical Decision Making ?Risk ?OTC drugs. ? ? ?Pt is 26 year old pregnant female. Fall from standing today here to ensure baby is alright. No LOC, vomiting. Has been having some abd pain since the fall but abd without bruising or TTP.  ? ?PE unremarkable.  ? ?I personally did an abd [place] which showed intrauterine pregnancy w FHR of ~160. CR measurement consistent w ~11w pregnancy.  ? ?Discussed with MAU APP who recommends transfer for evaluation.  ? ?RN informs me we must have an accepting physician.  ? ?Attending who acceptts is Fatima Blank ? ?Pt agrees to plan. Pain improved with tylenol.  ? ?Transferred to MAU for observation and specialist evaluation . ? ?Final Clinical Impression(s) / ED Diagnoses ?Final diagnoses:  ?Fall at home, initial encounter  ?Abdominal pain affecting pregnancy  ?[redacted] weeks gestation of pregnancy  ? ? ?Rx / DC Orders ?ED Discharge Orders   ? ?      Ordered  ?  Discharge patient       ? 04/22/21 1828  ? ?  ?  ? ?  ? ? ?  ?Tedd Sias, [place] ?04/28/21 1136 ? ?  ?Tegeler, Gwenyth Allegra, MD ?04/28/21 1323 ? ?

## 2021-04-22 NOTE — MAU Provider Note (Signed)
Chief Complaint: Fall ? ? None  ?  ?Tx from ed fall, normal [place] ? ?SUBJECTIVE ?HPI: Alexis [name] is a 26 y.o. G3P2002 at [redacted]w[redacted]d  who presents to maternity admissions as transfer from ED following fall at home. She reports long stay in ED today and desires discharge home as soon as possible. She has mild abdominal cramping that is unchanged since her fall earlier today.  There is no vaginal bleeding.   ? ?HPI ? ?Past Medical History:  ?Diagnosis Date  ? Beta thalassemia trait   ? Family history of breast cancer in mother   ? Gonorrhea 12/2020  ? treated  ? Seizures ([place])   ? last seizure in 2021  ? Shingles outbreak 10/2018  ? Trichomonas infection 02/2021  ? ?Past Surgical History:  ?Procedure Laterality Date  ? NO PAST SURGERIES    ? ?Social History  ? ?Socioeconomic History  ? Marital status: Single  ?  Spouse name: Not on file  ? Number of children: Not on file  ? Years of education: Not on file  ? Highest education level: Not on file  ?Occupational History  ? Not on file  ?Tobacco Use  ? Smoking status: Every Day  ?  Packs/day: 0.25  ?  Types: Cigarettes  ?  Passive exposure: Never  ? Smokeless tobacco: Never  ?Vaping Use  ? Vaping Use: Never used  ?Substance and Sexual Activity  ? Alcohol use: No  ? Drug use: Not Currently  ? Sexual activity: Yes  ?Other Topics Concern  ? Not on file  ?Social History Narrative  ? Right handed  ? Lives with family   ? ?Social Determinants of Health  ? ?Financial Resource Strain: Low Risk   ? Difficulty of Paying Living Expenses: Not very hard  ?Food Insecurity: No Food Insecurity  ? Worried About Charity fundraiser in the Last Year: Never true  ? Ran Out of Food in the Last Year: Never true  ?Transportation Needs: Unmet Transportation Needs  ? Lack of Transportation (Medical): Yes  ? Lack of Transportation (Non-Medical): Yes  ?Physical Activity: Insufficiently Active  ? Days of Exercise per Week: 3 days  ? Minutes of Exercise per Session: 20 min  ?Stress: Stress Concern  Present  ? Feeling of Stress : Very much  ?Social Connections: Moderately Integrated  ? Frequency of Communication with Friends and Family: Three times a week  ? Frequency of Social Gatherings with Friends and Family: Once a week  ? Attends Religious Services: More than 4 times per year  ? Active Member of Clubs or Organizations: No  ? Attends Archivist Meetings: Never  ? Marital Status: Living with partner  ?Intimate Partner Violence: Not At Risk  ? Fear of Current or Ex-Partner: No  ? Emotionally Abused: No  ? Physically Abused: No  ? Sexually Abused: No  ? ?No current facility-administered medications on file prior to encounter.  ? ?Current Outpatient Medications on File Prior to Encounter  ?Medication Sig Dispense Refill  ? Doxylamine-Pyridoxine (DICLEGIS) 10-10 MG TBEC Take 2 tabs at bedtime. If needed, add another tab in the morning. If needed, add another tab in the afternoon, up to 4 tabs/day. (Patient not taking: Reported on 04/19/2021) 100 tablet 0  ? polyethylene glycol powder (GLYCOLAX/MIRALAX) 17 GM/SCOOP powder Take 17 g by mouth daily as needed for moderate constipation. 500 g 2  ? Prenatal Vit-Fe Fumarate-FA (PRENATAL PLUS VITAMIN/MINERAL) 27-1 MG TABS Take 1 tablet by mouth daily. [place]  tablet 11  ? promethazine (PHENERGAN) 25 MG tablet Take 1 tablet (25 mg total) by mouth every 6 (six) hours as needed for nausea or vomiting. 30 tablet 2  ? ?No Known Allergies ? ?ROS:  ?Review of Systems ? ? ?I have reviewed patient's Past Medical Hx, Surgical Hx, Family Hx, Social Hx, medications and allergies.  ? ?Physical Exam  ?Patient Vitals for the past 24 hrs: ? BP Temp Temp src Pulse Resp SpO2 Height Weight  ?04/22/21 1700 (!) 121/58 -- -- (!) 59 (!) 21 100 % -- --  ?04/22/21 1522 -- -- -- -- -- -- 5\' 5"  (1.651 m) 70.8 kg  ?04/22/21 1515 115/82 98.4 ?F (36.9 ?C) Oral 80 13 100 % -- --  ? ?Constitutional: Well-developed, well-nourished female in no acute distress.  ?Cardiovascular: normal  rate ?Respiratory: normal effort ?GI: Abd soft, non-tender. Pos BS x 4 ?MS: Extremities nontender, no edema, normal ROM ?Neurologic: Alert and oriented x 4.  ?GU: Neg CVAT. ? ? ? ? ?LAB RESULTS ?No results found for this or any previous visit (from the past 24 hour(s)). ? ?--/--/O POS (01/23 2129) ? ?IMAGING ?Reviewed bedside US done in ED today and viable pregnancy with normal FHT noted ? ?MAU Management/MDM: ?Orders Placed This Encounter  ?Procedures  ? FAST [place]  ? ED EKG  ? Discharge patient  ?  ?Meds ordered this encounter  ?Medications  ? acetaminophen (TYLENOL) tablet 1,000 mg  ?  ?Pt blood type O positive, pain minimal currently, no vaginal bleeding, normal [place] in ED today. D/C home with f/u in office as scheduled, warning signs/reasons to return to MAU reviewed.  ? ?ASSESSMENT ?1. Fall at home, initial encounter   ?2. Abdominal pain affecting pregnancy   ?3. [redacted] weeks gestation of pregnancy   ? ? ?PLAN ?Discharge home ?Allergies as of 04/22/2021   ?No Known Allergies ?  ? ?  ?Medication List  ?  ? ?TAKE these medications   ? ?Doxylamine-Pyridoxine 10-10 MG Tbec ?Commonly known as: Diclegis ?Take 2 tabs at bedtime. If needed, add another tab in the morning. If needed, add another tab in the afternoon, up to 4 tabs/day. ?  ?polyethylene glycol powder 17 GM/SCOOP powder ?Commonly known as: GLYCOLAX/MIRALAX ?Take 17 g by mouth daily as needed for moderate constipation. ?  ?Prenatal Plus Vitamin/Mineral 27-1 MG Tabs ?Take 1 tablet by mouth daily. ?  ?promethazine 25 MG tablet ?Commonly known as: PHENERGAN ?Take 1 tablet (25 mg total) by mouth every 6 (six) hours as needed for nausea or vomiting. ?  ? ?  ? ? Follow-up Information   ? ? [facility] Follow up.   ?Specialty: Obstetrics and Gynecology ?Why: As needed for emergencies ?Contact information: ?[address] ?[id number] mc ?[place] ?[phone] ? ?  ?  ? ? [facility] Follow up.   ?Why:  As scheduled ?Contact information: ?[place] ?[place] [phone] ? ?  ?  ? ?  ?  ? ?  ? ? ?Lattie Haw Leftwich-Kirby ?Certified Nurse-Midwife ?04/22/2021  ?6:45 PM ? ? ?  ?

## 2021-04-22 NOTE — ED Triage Notes (Signed)
Pt BIB [facility] from home and is [redacted] weeks pregnant and states that her leg gave out and she fell on a baby gate. Pt complains of R sided pain. Pt states that she wants to make sure that her baby is ok. ?VS: 108/70  80  98% ?

## 2021-04-22 NOTE — ED Notes (Signed)
Pt IV secured and given instructions to go straight to MAU. Report called to Charge RN at MAU. Pt verbalizes understanding of instructions.  ?

## 2021-04-22 NOTE — MAU Note (Signed)
Registration called and stated that patient wanted to leave and did not know why she was transported to MAU. L. Leftwich-Kirby and I went to talk to patient in room 120. No VB or LOF. States her pain is a 5 from her fall. Declines to be seen in MAU. Discharge orders placed by CNM. Return precautions given. FHR obtained in ED.  ?

## 2021-05-03 ENCOUNTER — Ambulatory Visit: Payer: Medicaid Other | Admitting: Neurology

## 2021-05-14 ENCOUNTER — Telehealth (HOSPITAL_BASED_OUTPATIENT_CLINIC_OR_DEPARTMENT_OTHER): Payer: Self-pay | Admitting: *Deleted

## 2021-05-14 NOTE — Telephone Encounter (Signed)
Pt called stating that she noticed some light pink spotting this morning when she wiped. She has not had intercourse within the last couple of days. She denies cramping, any other discharge, vaginal odor, or itching. Advised pt that she could come to the office for evaluation of fetal heart tones. Pt is going to try and get here before we close. Advised that if she is unable to get here before we close, she can go to MAU for evaluation. Pt verbalized understanding.  ?

## 2021-05-17 ENCOUNTER — Encounter (HOSPITAL_BASED_OUTPATIENT_CLINIC_OR_DEPARTMENT_OTHER): Payer: Medicaid Other | Admitting: Obstetrics & Gynecology

## 2021-05-17 ENCOUNTER — Encounter (HOSPITAL_BASED_OUTPATIENT_CLINIC_OR_DEPARTMENT_OTHER): Payer: Self-pay

## 2021-05-18 ENCOUNTER — Encounter (HOSPITAL_COMMUNITY): Payer: Self-pay | Admitting: Obstetrics and Gynecology

## 2021-05-18 ENCOUNTER — Other Ambulatory Visit: Payer: Self-pay

## 2021-05-18 ENCOUNTER — Inpatient Hospital Stay (HOSPITAL_BASED_OUTPATIENT_CLINIC_OR_DEPARTMENT_OTHER): Payer: Medicaid Other

## 2021-05-18 ENCOUNTER — Inpatient Hospital Stay (HOSPITAL_COMMUNITY)
Admission: AD | Admit: 2021-05-18 | Discharge: 2021-05-18 | Disposition: A | Discharge status: Home / Self Care | Payer: Medicaid Other | Attending: Obstetrics and Gynecology | Admitting: Obstetrics and Gynecology

## 2021-05-18 DIAGNOSIS — O4692 Antepartum hemorrhage, unspecified, second trimester: Secondary | ICD-10-CM

## 2021-05-18 DIAGNOSIS — Z3A14 14 weeks gestation of pregnancy: Secondary | ICD-10-CM | POA: Diagnosis not present

## 2021-05-18 DIAGNOSIS — D509 Iron deficiency anemia, unspecified: Secondary | ICD-10-CM | POA: Insufficient documentation

## 2021-05-18 DIAGNOSIS — O99332 Smoking (tobacco) complicating pregnancy, second trimester: Secondary | ICD-10-CM | POA: Diagnosis not present

## 2021-05-18 DIAGNOSIS — O26892 Other specified pregnancy related conditions, second trimester: Secondary | ICD-10-CM | POA: Diagnosis not present

## 2021-05-18 DIAGNOSIS — R1030 Lower abdominal pain, unspecified: Secondary | ICD-10-CM | POA: Diagnosis not present

## 2021-05-18 DIAGNOSIS — O99012 Anemia complicating pregnancy, second trimester: Secondary | ICD-10-CM | POA: Insufficient documentation

## 2021-05-18 DIAGNOSIS — O209 Hemorrhage in early pregnancy, unspecified: Secondary | ICD-10-CM | POA: Insufficient documentation

## 2021-05-18 DIAGNOSIS — R1084 Generalized abdominal pain: Secondary | ICD-10-CM | POA: Diagnosis not present

## 2021-05-18 DIAGNOSIS — F17210 Nicotine dependence, cigarettes, uncomplicated: Secondary | ICD-10-CM | POA: Diagnosis not present

## 2021-05-18 DIAGNOSIS — R109 Unspecified abdominal pain: Secondary | ICD-10-CM | POA: Diagnosis not present

## 2021-05-18 LAB — URINALYSIS, ROUTINE W REFLEX MICROSCOPIC
Bilirubin Urine: NEGATIVE
Glucose, UA: NEGATIVE mg/dL
Hgb urine dipstick: NEGATIVE
Ketones, ur: 80 mg/dL — AB
Nitrite: NEGATIVE
Protein, ur: NEGATIVE mg/dL
Specific Gravity, Urine: 1.018 (ref 1.005–1.030)
pH: 6 (ref 5.0–8.0)

## 2021-05-18 LAB — COMPREHENSIVE METABOLIC PANEL
ALT: 8 U/L (ref 0–44)
AST: 16 U/L (ref 15–41)
Albumin: 3.4 g/dL — ABNORMAL LOW (ref 3.5–5.0)
Alkaline Phosphatase: 26 U/L — ABNORMAL LOW (ref 38–126)
Anion gap: 7 (ref 5–15)
BUN: <5 mg/dL — ABNORMAL LOW (ref 6–20)
CO2: 22 mmol/L (ref 22–32)
Calcium: 8.8 mg/dL — ABNORMAL LOW (ref 8.9–10.3)
Chloride: 107 mmol/L (ref 98–111)
Creatinine, Ser: 0.56 mg/dL (ref 0.44–1.00)
GFR, Estimated: >60 mL/min (ref >60)
Glucose, Bld: 89 mg/dL (ref 70–99)
Potassium: 3.4 mmol/L — ABNORMAL LOW (ref 3.5–5.1)
Sodium: 136 mmol/L (ref 135–145)
Total Bilirubin: 0.5 mg/dL (ref 0.3–1.2)
Total Protein: 6.1 g/dL — ABNORMAL LOW (ref 6.5–8.1)

## 2021-05-18 LAB — CBC
HCT: 26.5 % — ABNORMAL LOW (ref 36.0–46.0)
Hemoglobin: 9 g/dL — ABNORMAL LOW (ref 12.0–15.0)
MCH: 23.4 pg — ABNORMAL LOW (ref 26.0–34.0)
MCHC: 34 g/dL (ref 30.0–36.0)
MCV: 68.8 fL — ABNORMAL LOW (ref 80.0–100.0)
Platelets: 264 10*3/uL (ref 150–400)
RBC: 3.85 MIL/uL — ABNORMAL LOW (ref 3.87–5.11)
RDW: 16.4 % — ABNORMAL HIGH (ref 11.5–15.5)
WBC: 12.1 10*3/uL — ABNORMAL HIGH (ref 4.0–10.5)
nRBC: 0 % (ref 0.0–0.2)

## 2021-05-18 LAB — GC/CHLAMYDIA PROBE AMP (~~LOC~~) NOT AT ARMC
Chlamydia: NEGATIVE
Comment: NEGATIVE
Comment: NORMAL
Neisseria Gonorrhea: NEGATIVE

## 2021-05-18 LAB — WET PREP, GENITAL
Sperm: NONE SEEN
Trich, Wet Prep: NONE SEEN
WBC, Wet Prep HPF POC: <10 (ref <10)
Yeast Wet Prep HPF POC: NONE SEEN

## 2021-05-18 MED ORDER — CYCLOBENZAPRINE HCL 5 MG PO TABS
10.0000 mg | ORAL_TABLET | Freq: Once | ORAL | Status: AC
  Administered 2021-05-18: 10 mg via ORAL
  Filled 2021-05-18: qty 2

## 2021-05-18 MED ORDER — ACETAMINOPHEN 500 MG PO TABS
1000.0000 mg | ORAL_TABLET | Freq: Once | ORAL | Status: AC
  Administered 2021-05-18: 1000 mg via ORAL
  Filled 2021-05-18: qty 2

## 2021-05-18 MED ORDER — VITAMIN C 250 MG PO TABS
250.0000 mg | ORAL_TABLET | Freq: Every day | ORAL | 1 refills | Status: DC
Start: 2021-05-18 — End: 2021-06-17

## 2021-05-18 MED ORDER — FERROUS SULFATE 325 (65 FE) MG PO TBEC
325.0000 mg | DELAYED_RELEASE_TABLET | ORAL | 2 refills | Status: DC
Start: 2021-05-18 — End: 2022-12-16
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
## 2021-05-18 NOTE — Discharge Instructions (Signed)
You have a prescription for iron and vitamin C at [facility] on [address] ?

## 2021-05-18 NOTE — MAU Provider Note (Signed)
?History  ?  ? ?CSN: [id number] ? ?Arrival date and time: 05/18/21 0040 ? ? Event Date/Time  ? First Provider Initiated Contact with Patient 05/18/21 0150   ?  ? ?Chief Complaint  ?Patient presents with  ? Vaginal Bleeding  ? ?[name] is a 26 y.o. year old G30P2002 female at [redacted]w[redacted]d weeks gestation who presents to MAU via EMS reporting VB and lower abdominal pain since 2000 tonight. She denies any recent SI. She is wearing peripad from home upon arrival with no blood staining. She rates the lower abdominal pain a 10/10; "it's going all over my stomach." She denies any recent SI. She receives [facility] with [facility]. ? ? ?OB History   ? ? Gravida  ?3  ? Para  ?2  ? Term  ?2  ? Preterm  ?   ? AB  ?   ? Living  ?2  ?  ? ? SAB  ?   ? IAB  ?   ? Ectopic  ?   ? Multiple  ?0  ? Live Births  ?1  ?   ?  ?  ? ? ?Past Medical History:  ?Diagnosis Date  ? Beta thalassemia trait   ? Family history of breast cancer in mother   ? Gonorrhea 12/2020  ? treated  ? Seizures ([facility])   ? last seizure in 2021  ? Shingles outbreak 10/2018  ? Trichomonas infection 02/2021  ? ? ?Past Surgical History:  ?Procedure Laterality Date  ? NO PAST SURGERIES    ? ? ?Family History  ?Problem Relation Age of Onset  ? Cancer Mother 75  ?     breast  ? Heart disease Father   ? Heart disease Paternal Grandmother   ? Heart disease Paternal Grandfather   ? ? ?Social History  ? ?Tobacco Use  ? Smoking status: Every Day  ?  Packs/day: 0.25  ?  Types: Cigarettes  ?  Passive exposure: Never  ? Smokeless tobacco: Never  ?Vaping Use  ? Vaping Use: Never used  ?Substance Use Topics  ? Alcohol use: No  ? Drug use: Not Currently  ? ? ?Allergies: No Known Allergies ? ?Medications Prior to Admission  ?Medication Sig Dispense Refill Last Dose  ? Prenatal Vit-Fe Fumarate-FA (PRENATAL PLUS VITAMIN/MINERAL) 27-1 MG TABS Take 1 tablet by mouth daily. 30 tablet 11 05/17/2021  ? Doxylamine-Pyridoxine (DICLEGIS) 10-10 MG TBEC Take 2 tabs at bedtime. If needed, add another tab in  the morning. If needed, add another tab in the afternoon, up to 4 tabs/day. (Patient not taking: Reported on 04/19/2021) 100 tablet 0   ? polyethylene glycol powder (GLYCOLAX/MIRALAX) 17 GM/SCOOP powder Take 17 g by mouth daily as needed for moderate constipation. 500 g 2   ? promethazine (PHENERGAN) 25 MG tablet Take 1 tablet (25 mg total) by mouth every 6 (six) hours as needed for nausea or vomiting. 30 tablet 2   ? ? ?Review of Systems  ?Constitutional: Negative.   ?HENT: Negative.    ?Eyes: Negative.   ?Respiratory: Negative.    ?Cardiovascular: Negative.   ?Gastrointestinal:  Positive for abdominal pain.  ?Endocrine: Negative.   ?Genitourinary:  Positive for pelvic pain and vaginal bleeding.  ?Musculoskeletal: Negative.   ?Skin: Negative.   ?Allergic/Immunologic: Negative.   ?Neurological: Negative.   ?Hematological: Negative.   ?Psychiatric/Behavioral: Negative.    ?Physical Exam  ? ?Blood pressure 121/74, pulse (!) 111, temperature 98.2 ?F (36.8 ?C), temperature source Oral, resp. rate 18, last  menstrual period 02/05/2021, SpO2 98 %. ? ?Physical Exam ?Vitals and nursing note reviewed. Exam conducted with a chaperone present.  ?Constitutional:   ?   Appearance: Normal appearance. She is normal weight.  ?Cardiovascular:  ?   Rate and Rhythm: Tachycardia present.  ?Pulmonary:  ?   Effort: Pulmonary effort is normal.  ?Abdominal:  ?   Palpations: Abdomen is soft.  ?   Tenderness: There is abdominal tenderness.  ?Genitourinary: ?   General: Normal vulva.  ?   Comments: Pelvic exam: External genitalia normal, SE: vaginal walls pink and well rugated, cervix is smooth, pink, no lesions, small amt of thin, white vaginal d/c -- WP, GC/CT done, cervix visually closed, Uterus is non-tender, S=D, no CMT or friability, RT adnexal tenderness.  ?Musculoskeletal:     ?   General: Normal range of motion.  ?Skin: ?   General: Skin is warm and dry.  ?   Capillary Refill: Capillary refill takes more than 3 seconds.  ?Neurological:   ?   Mental Status: She is alert and oriented to person, place, and time.  ?Psychiatric:     ?   Mood and Affect: Mood normal.     ?   Behavior: Behavior normal.     ?   Thought Content: Thought content normal.     ?   Judgment: Judgment normal.  ? ? ?MAU Course  ?Procedures ? ?MDM ?Wet Prep ?GC/CT -- Results pending  ?OB MFM Limited U/S ? ?Reassessment @ 0320: Patient found sleeping on stomach. When asked pain level, she states the "pain is still there. It's a 6/10 now." ? ?Results for orders placed or performed during the hospital encounter of 05/18/21 (from the past 24 hour(s))  ?Urinalysis, Routine w reflex microscopic Urine, Clean Catch     Status: Abnormal  ? Collection Time: 05/18/21  1:15 AM  ?Result Value Ref Range  ? Color, Urine YELLOW YELLOW  ? APPearance HAZY (A) CLEAR  ? Specific Gravity, Urine 1.018 1.005 - 1.030  ? pH 6.0 5.0 - 8.0  ? Glucose, UA NEGATIVE NEGATIVE mg/dL  ? Hgb urine dipstick NEGATIVE NEGATIVE  ? Bilirubin Urine NEGATIVE NEGATIVE  ? Ketones, ur 80 (A) NEGATIVE mg/dL  ? Protein, ur NEGATIVE NEGATIVE mg/dL  ? Nitrite NEGATIVE NEGATIVE  ? Leukocytes,Ua SMALL (A) NEGATIVE  ? RBC / HPF 0-5 0 - 5 RBC/hpf  ? WBC, UA 0-5 0 - 5 WBC/hpf  ? Bacteria, UA RARE (A) NONE SEEN  ? Squamous Epithelial / LPF 6-10 0 - 5  ? Mucus PRESENT   ?Wet prep, genital     Status: Abnormal  ? Collection Time: 05/18/21  2:00 AM  ?Result Value Ref Range  ? Yeast Wet Prep HPF POC NONE SEEN NONE SEEN  ? Trich, Wet Prep NONE SEEN NONE SEEN  ? Clue Cells Wet Prep HPF POC PRESENT (A) NONE SEEN  ? WBC, Wet Prep HPF POC <10 <10  ? Sperm NONE SEEN   ?CBC     Status: Abnormal  ? Collection Time: 05/18/21  2:07 AM  ?Result Value Ref Range  ? WBC 12.1 (H) 4.0 - 10.5 K/uL  ? RBC 3.85 (L) 3.87 - 5.11 MIL/uL  ? Hemoglobin 9.0 (L) 12.0 - 15.0 g/dL  ? HCT 26.5 (L) 36.0 - 46.0 %  ? MCV 68.8 (L) 80.0 - 100.0 fL  ? MCH 23.4 (L) 26.0 - 34.0 pg  ? MCHC 34.0 30.0 - 36.0 g/dL  ? RDW 16.4 (H) 11.5 -  15.5 %  ? Platelets 264 150 - 400 K/uL  ?  nRBC 0.0 0.0 - 0.2 %  ?Comprehensive metabolic panel     Status: Abnormal  ? Collection Time: 05/18/21  2:07 AM  ?Result Value Ref Range  ? Sodium 136 135 - 145 mmol/L  ? Potassium 3.4 (L) 3.5 - 5.1 mmol/L  ? Chloride 107 98 - 111 mmol/L  ? CO2 22 22 - 32 mmol/L  ? Glucose, Bld 89 70 - 99 mg/dL  ? BUN <5 (L) 6 - 20 mg/dL  ? Creatinine, Ser 0.56 0.44 - 1.00 mg/dL  ? Calcium 8.8 (L) 8.9 - 10.3 mg/dL  ? Total Protein 6.1 (L) 6.5 - 8.1 g/dL  ? Albumin 3.4 (L) 3.5 - 5.0 g/dL  ? AST 16 15 - 41 U/L  ? ALT 8 0 - 44 U/L  ? Alkaline Phosphatase 26 (L) 38 - 126 U/L  ? Total Bilirubin 0.5 0.3 - 1.2 mg/dL  ? GFR, Estimated >60 >60 mL/min  ? Anion gap 7 5 - 15  ?  ? ?  ?OB MFM Limited U/S ? ?Assessment and Plan  ?Abdominal pain during pregnancy in second trimester  ?- Information provided on abdominal pain ?  ?Vaginal bleeding in pregnancy, second trimester ?- Reassurance given that there is no evidence of VB ? ?Iron deficiency anemia of mother during pregnancy  ?- Information provided on anemia ?- Rx for FeSO4 and vitamin C QOD ?  ?[redacted] weeks gestation of pregnancy ? ?- Discharge patient  ?- Keep scheduled appt with [facility] ?- Patient verbalized an understanding of the plan of care and agrees.  ? ?Raelyn MoraRolitta Schuyler Behan, CNM ?05/18/2021, 1:50 AM  ?

## 2021-05-18 NOTE — MAU Note (Signed)
Alexis [name] is a 26 y.o. at [redacted]w[redacted]d here in MAU reporting: here via EMS stating she noticed VB and lower abdominal pressure that started around 2000. Was trying to find a way here and resulted to calling EMS after not being able to find a ride. Denies any recent intercourse. Pt wearing a pad and no blood seen on assessment.  ? ?Onset of complaint: 2000  ?Pain score: 10 ?Vitals:  ? 05/18/21 0045  ?BP: (!) 106/58  ?Pulse: 89  ?Resp: 18  ?Temp: 98.2 ?F (36.8 ?C)  ?SpO2: 98%  ?   ?FHT:166 ?Lab orders placed from triage: U/A  ? ?

## 2021-05-19 ENCOUNTER — Encounter (HOSPITAL_BASED_OUTPATIENT_CLINIC_OR_DEPARTMENT_OTHER): Payer: Medicaid Other | Admitting: Obstetrics & Gynecology

## 2021-05-26 ENCOUNTER — Telehealth (HOSPITAL_BASED_OUTPATIENT_CLINIC_OR_DEPARTMENT_OTHER): Payer: Self-pay | Admitting: *Deleted

## 2021-05-26 NOTE — Telephone Encounter (Signed)
LMOVM for pt to call office and reschedule missed appt ?

## 2021-05-31 ENCOUNTER — Telehealth: Payer: Medicaid Other | Admitting: Physician Assistant

## 2021-05-31 DIAGNOSIS — J020 Streptococcal pharyngitis: Secondary | ICD-10-CM | POA: Diagnosis not present

## 2021-05-31 MED ORDER — AMOXICILLIN 500 MG PO CAPS: 500.0000 mg | ORAL_CAPSULE | Freq: Two times a day (BID) | ORAL | 0 refills | Status: DC

## 2021-05-31 NOTE — Progress Notes (Signed)
?Virtual Visit Consent  ? ?Alexis [name], you are scheduled for a virtual visit with a [facility] provider today.   ?  ?Just as with appointments in the office, your consent must be obtained to participate.  Your consent will be active for this visit and any virtual visit you may have with one of our providers in the next 365 days.   ?  ?If you have a MyChart account, a copy of this consent can be sent to you electronically.  All virtual visits are billed to your insurance company just like a traditional visit in the office.   ? ?As this is a virtual visit, video technology does not allow for your provider to perform a traditional examination.  This may limit your provider's ability to fully assess your condition.  If your provider identifies any concerns that need to be evaluated in person or the need to arrange testing (such as labs, EKG, etc.), we will make arrangements to do so.   ?  ?Although advances in technology are sophisticated, we cannot ensure that it will always work on either your end or our end.  If the connection with a video visit is poor, the visit may have to be switched to a telephone visit.  With either a video or telephone visit, we are not always able to ensure that we have a secure connection.    ? ?I need to obtain your verbal consent now.   Are you willing to proceed with your visit today?  ?  ?NICKIA BOESEN has provided verbal consent on 05/31/2021 for a virtual visit (video or telephone). ?  ?Margaretann Loveless, PA-C  ? ?Date: 05/31/2021 3:59 PM ? ? ?Virtual Visit via Video Note  ? Delmer Islam, connected with  AVEEN STANSEL  ([id number], May 29, 1995) on 05/31/21 at  4:00 PM EDT by a video-enabled telemedicine application and verified that I am speaking with the correct person using two identifiers. ? ?Location: ?Patient: Virtual Visit Location Patient: Home ?Provider: Virtual Visit Location Provider: Home Office ?  ?I discussed the limitations of evaluation and  management by telemedicine and the availability of in person appointments. The patient expressed understanding and agreed to proceed.   ? ?History of Present Illness: ?Alexis [name] is a 26 y.o. who identifies as a female who was assigned female at birth, and is being seen today for URI/sinus symptoms. ? ?HPI: URI  ?This is a new problem. The current episode started yesterday. The problem has been gradually worsening. The maximum temperature recorded prior to her arrival was 100.4 - 100.9 F. The fever has been present for Less than 1 day. Associated symptoms include congestion, headaches, a plugged ear sensation (right), rhinorrhea, a sore throat and swollen glands. Pertinent negatives include no coughing, ear pain or sinus pain. She has tried acetaminophen (vicks, robitussin, tylenol) for the symptoms. The treatment provided no relief.   ?Pregnant [redacted]w[redacted]d ? ?Problems:  ?Patient Active Problem List  ? Diagnosis Date Noted  ? Rubella non-immune status, antepartum 04/20/2021  ? Anemia in pregnancy 04/20/2021  ? Supervision of high risk pregnancy, antepartum 04/19/2021  ? History of trichomoniasis 04/19/2021  ? Seizure disorder (HCC) 04/19/2021  ? Smoker 04/19/2021  ? History of gonorrhea 04/19/2021  ? Depression 12/31/2020  ?  ?Allergies: No Known Allergies ?Medications:  ?Current Outpatient Medications:  ?  amoxicillin (AMOXIL) 500 MG capsule, Take 1 capsule (500 mg total) by mouth 2 (two) times daily for 10 days., Disp: 20  capsule, Rfl: 0 ?  Doxylamine-Pyridoxine (DICLEGIS) 10-10 MG TBEC, Take 2 tabs at bedtime. If needed, add another tab in the morning. If needed, add another tab in the afternoon, up to 4 tabs/day. (Patient not taking: Reported on 04/19/2021), Disp: 100 tablet, Rfl: 0 ?  ferrous sulfate 325 (65 FE) MG EC tablet, Take 1 tablet (325 mg total) by mouth every other day., Disp: 30 tablet, Rfl: 2 ?  polyethylene glycol powder (GLYCOLAX/MIRALAX) 17 GM/SCOOP powder, Take 17 g by mouth daily as needed for  moderate constipation., Disp: 500 g, Rfl: 2 ?  Prenatal Vit-Fe Fumarate-FA (PRENATAL PLUS VITAMIN/MINERAL) 27-1 MG TABS, Take 1 tablet by mouth daily., Disp: 30 tablet, Rfl: 11 ?  promethazine (PHENERGAN) 25 MG tablet, Take 1 tablet (25 mg total) by mouth every 6 (six) hours as needed for nausea or vomiting., Disp: 30 tablet, Rfl: 2 ?  vitamin C (ASCORBIC ACID) 250 MG tablet, Take 1 tablet (250 mg total) by mouth daily., Disp: 60 tablet, Rfl: 1 ? ?Observations/Objective: ?Patient is well-developed, well-nourished in no acute distress.  ?Resting comfortably at home.  ?Head is normocephalic, atraumatic.  ?No labored breathing.  ?Speech is clear and coherent with logical content.  ?Patient is alert and oriented at baseline.  ? ? ?Assessment and Plan: ?1. Strep throat ?- amoxicillin (AMOXIL) 500 MG capsule; Take 1 capsule (500 mg total) by mouth 2 (two) times daily for 10 days.  Dispense: 20 capsule; Refill: 0 ? ?- Possible strep throat ?- Amoxicillin prescribed ?- Continue tylenol ?- Push fluids ?- Salt water gargles ?- Seek in person evaluation if symptoms continue to worsen or fail to improve with treatment ? ?Follow Up Instructions: ?I discussed the assessment and treatment plan with the patient. The patient was provided an opportunity to ask questions and all were answered. The patient agreed with the plan and demonstrated an understanding of the instructions.  A copy of instructions were sent to the patient via MyChart unless otherwise noted below.  ? ? ?The patient was advised to call back or seek an in-person evaluation if the symptoms worsen or if the condition fails to improve as anticipated. ? ?Time:  ?I spent 10 minutes with the patient via telehealth technology discussing the above problems/concerns.   ? ?Margaretann Loveless, PA-C ?

## 2021-05-31 NOTE — Patient Instructions (Signed)
?Alexis [name], thank you for joining Margaretann Loveless, PA-C for today's virtual visit.  While this provider is not your primary care provider (PCP), if your PCP is located in our provider database this encounter information will be shared with them immediately following your visit. ? ?Consent: ?(Patient) Alexis [name] provided verbal consent for this virtual visit at the beginning of the encounter. ? ?Current Medications: ? ?Current Outpatient Medications:  ?  amoxicillin (AMOXIL) 500 MG capsule, Take 1 capsule (500 mg total) by mouth 2 (two) times daily for 10 days., Disp: 20 capsule, Rfl: 0 ?  Doxylamine-Pyridoxine (DICLEGIS) 10-10 MG TBEC, Take 2 tabs at bedtime. If needed, add another tab in the morning. If needed, add another tab in the afternoon, up to 4 tabs/day. (Patient not taking: Reported on 04/19/2021), Disp: 100 tablet, Rfl: 0 ?  ferrous sulfate 325 (65 FE) MG EC tablet, Take 1 tablet (325 mg total) by mouth every other day., Disp: 30 tablet, Rfl: 2 ?  polyethylene glycol powder (GLYCOLAX/MIRALAX) 17 GM/SCOOP powder, Take 17 g by mouth daily as needed for moderate constipation., Disp: 500 g, Rfl: 2 ?  Prenatal Vit-Fe Fumarate-FA (PRENATAL PLUS VITAMIN/MINERAL) 27-1 MG TABS, Take 1 tablet by mouth daily., Disp: 30 tablet, Rfl: 11 ?  promethazine (PHENERGAN) 25 MG tablet, Take 1 tablet (25 mg total) by mouth every 6 (six) hours as needed for nausea or vomiting., Disp: 30 tablet, Rfl: 2 ?  vitamin C (ASCORBIC ACID) 250 MG tablet, Take 1 tablet (250 mg total) by mouth daily., Disp: 60 tablet, Rfl: 1  ? ?Medications ordered in this encounter:  ?Meds ordered this encounter  ?Medications  ? amoxicillin (AMOXIL) 500 MG capsule  ?  Sig: Take 1 capsule (500 mg total) by mouth 2 (two) times daily for 10 days.  ?  Dispense:  20 capsule  ?  Refill:  0  ?  Order Specific Question:   Supervising Provider  ?  Answer:   Eber Hong [[id number]]  ?  ? ?*If you need refills on other medications prior to your next  appointment, please contact your pharmacy* ? ?Follow-Up: ?Call back or seek an in-person evaluation if the symptoms worsen or if the condition fails to improve as anticipated. ? ?Other Instructions ? ?Strep Throat, Adult ?Strep throat is an infection of the throat. It is caused by germs (bacteria). Strep throat is common during the cold months of the year. It mostly affects children who are 72-18 years old. However, people of all ages can get it at any time of the year. This infection spreads from person to person through coughing, sneezing, or having close contact. ?What are the causes? ?This condition is caused by the Streptococcus pyogenes germ. ?What increases the risk? ?You care for young children. Children are more likely to get strep throat and may spread it to others. ?You go to crowded places. Germs can spread easily in such places. ?You kiss or touch someone who has strep throat. ?What are the signs or symptoms? ?Fever or chills. ?Redness, swelling, or pain in the tonsils or throat. ?Pain or trouble when swallowing. ?White or yellow spots on the tonsils or throat. ?Tender glands in the neck and under the jaw. ?Bad breath. ?Red rash all over the body. This is rare. ?How is this treated? ?Medicines that kill germs (antibiotics). ?Medicines that treat pain or fever. These include: ?Ibuprofen or acetaminophen. ?Aspirin, only for people who are over the age of 71. ?Cough drops. ?Throat sprays. ?Follow these  instructions at home: ?Medicines ? ?Take over-the-counter and prescription medicines only as told by your doctor. ?Take your antibiotic medicine as told by your doctor. Do not stop taking the antibiotic even if you start to feel better. ?Eating and drinking ? ?If you have trouble swallowing, eat soft foods until your throat feels better. ?Drink enough fluid to keep your pee (urine) pale yellow. ?To help with pain, you may have: ?Warm fluids, such as soup and tea. ?Cold fluids, such as frozen desserts or  popsicles. ?General instructions ?Rinse your mouth (gargle) with a salt-water mixture 3-4 times a day or as needed. To make a salt-water mixture, dissolve ?-1 tsp (3-6 g) of salt in 1 cup (237 mL) of warm water. ?Rest as much as you can. ?Stay home from work or school until you have been taking antibiotics for 24 hours. ?Do not smoke or use any products that contain nicotine or tobacco. If you need help quitting, ask your doctor. ?Keep all follow-up visits. ?How is this prevented? ? ?Do not share food, drinking cups, or personal items. They can cause the germs to spread. ?Wash your hands well with soap and water. Make sure that all people in your house wash their hands well. ?Have family members tested if they have a fever or a sore throat. They may need an antibiotic if they have strep throat. ?Contact a doctor if: ?You have swelling in your neck that keeps getting bigger. ?You get a rash, cough, or earache. ?You cough up a thick fluid that is green, yellow-brown, or bloody. ?You have pain that does not get better with medicine. ?Your symptoms get worse instead of getting better. ?You have a fever. ?Get help right away if: ?You vomit. ?You have a very bad headache. ?Your neck hurts or feels stiff. ?You have chest pain or are short of breath. ?You have drooling, very bad throat pain, or changes in your voice. ?Your neck is swollen, or the skin gets red and tender. ?Your mouth is dry, or you are peeing less than normal. ?You keep feeling more tired or have trouble waking up. ?Your joints are red or painful. ?These symptoms may be an emergency. Do not wait to see if the symptoms will go away. Get help right away. Call your local emergency services (911 in the [place] ?Summary ?Strep throat is an infection of the throat. It is caused by germs (bacteria). ?This infection can spread from person to person through coughing, sneezing, or having close contact. ?Take your medicines, including antibiotics, as told by your  doctor. Do not stop taking the antibiotic even if you start to feel better. ?To prevent the spread of germs, wash your hands well with soap and water. Have others do the same. Do not share food, drinking cups, or personal items. ?Get help right away if you have a bad headache, chest pain, shortness of breath, a stiff or painful neck, or you vomit. ?This information is not intended to replace advice given to you by your health care provider. Make sure you discuss any questions you have with your health care provider. ?Document Revised: 06/02/2020 Document Reviewed: 06/02/2020 ?Elsevier Patient Education ? 2022 Elsevier Inc[employer] ? ? ? ?If you have been instructed to have an in-person evaluation today at a local Urgent Care facility, please use the link below. It will take you to a list of all of our available [facility], including address, phone number and hours of operation. Please do not delay care.  ?[facility] ? ?If you or a family member do not have a primary care provider, use the link below to schedule a visit and establish care. When you choose a [facility] primary care physician or advanced practice provider, you gain a long-term partner in health. ?Find a Primary Care Provider ? ?Learn more about [facility]'s in-office and virtual care options: ?[facility] - Get Care Now ?

## 2021-06-01 ENCOUNTER — Telehealth (HOSPITAL_BASED_OUTPATIENT_CLINIC_OR_DEPARTMENT_OTHER): Payer: Self-pay | Admitting: *Deleted

## 2021-06-01 NOTE — Telephone Encounter (Signed)
Attempted to call pt to reschedule missed appt. LMOVM for pt to call back ?

## 2021-06-06 ENCOUNTER — Other Ambulatory Visit: Payer: Self-pay

## 2021-06-06 ENCOUNTER — Inpatient Hospital Stay (HOSPITAL_COMMUNITY)
Admission: AD | Admit: 2021-06-06 | Discharge: 2021-06-06 | Disposition: A | Discharge status: Home / Self Care | Payer: Medicaid Other | Attending: Obstetrics and Gynecology | Admitting: Obstetrics and Gynecology

## 2021-06-06 ENCOUNTER — Encounter (HOSPITAL_COMMUNITY): Payer: Self-pay | Admitting: Obstetrics and Gynecology

## 2021-06-06 DIAGNOSIS — F1220 Cannabis dependence, uncomplicated: Secondary | ICD-10-CM | POA: Insufficient documentation

## 2021-06-06 DIAGNOSIS — O26892 Other specified pregnancy related conditions, second trimester: Secondary | ICD-10-CM | POA: Diagnosis not present

## 2021-06-06 DIAGNOSIS — O99332 Smoking (tobacco) complicating pregnancy, second trimester: Secondary | ICD-10-CM | POA: Diagnosis not present

## 2021-06-06 DIAGNOSIS — O210 Mild hyperemesis gravidarum: Secondary | ICD-10-CM | POA: Diagnosis not present

## 2021-06-06 DIAGNOSIS — O99322 Drug use complicating pregnancy, second trimester: Secondary | ICD-10-CM | POA: Diagnosis present

## 2021-06-06 DIAGNOSIS — F12288 Cannabis dependence with other cannabis-induced disorder: Secondary | ICD-10-CM | POA: Diagnosis not present

## 2021-06-06 DIAGNOSIS — O98512 Other viral diseases complicating pregnancy, second trimester: Secondary | ICD-10-CM | POA: Diagnosis not present

## 2021-06-06 DIAGNOSIS — Z3A17 17 weeks gestation of pregnancy: Secondary | ICD-10-CM | POA: Diagnosis not present

## 2021-06-06 HISTORY — DX: Cannabis abuse, uncomplicated: F12.10

## 2021-06-06 LAB — WET PREP, GENITAL
Sperm: NONE SEEN
Trich, Wet Prep: NONE SEEN
WBC, Wet Prep HPF POC: >=10 — AB (ref <10)
Yeast Wet Prep HPF POC: NONE SEEN

## 2021-06-06 MED ORDER — DIPHENHYDRAMINE HCL 25 MG PO CAPS
50.0000 mg | ORAL_CAPSULE | Freq: Four times a day (QID) | ORAL | Status: DC | PRN
  Administered 2021-06-06: 50 mg via ORAL
  Filled 2021-06-06: qty 2

## 2021-06-06 MED ORDER — LACTATED RINGERS IV BOLUS
1000.0000 mL | Freq: Once | INTRAVENOUS | Status: AC
  Administered 2021-06-06: 1000 mL via INTRAVENOUS

## 2021-06-06 MED ORDER — HALOPERIDOL LACTATE 5 MG/ML IJ SOLN
2.0000 mg | Freq: Once | INTRAMUSCULAR | Status: AC
  Administered 2021-06-06: 2 mg via INTRAVENOUS
  Filled 2021-06-06: qty 0.4

## 2021-06-06 NOTE — MAU Note (Signed)
.  Alexis [name] is a 26 y.o. at [redacted]w[redacted]d arrived via EMS in MAU reporting: cramping that began at 1845. Pt states she has a history of smoking marjuana daily and has tried to slow down and stop and feels that this is the cause of her pain. No vaginal bleeding or bloody show. Pt was assisted out of her clothes into hospital gown. 2029 - 2031 Maryelizabeth Kaufmann CNM in-history obtained and speculum exam with swabs collected after pt consent. ? ?Onset of complaint: 1845 ?Pain score: 7 ?Vitals:  ? 06/06/21 2034  ?BP: 100/81  ?Pulse: 70  ?Resp: 17  ?Temp: 98.6 ?F (37 ?C)  ?SpO2: 100%  ?   ?FHT:155bpm ?    ?

## 2021-06-06 NOTE — Discharge Instructions (Signed)

## 2021-06-06 NOTE — MAU Provider Note (Addendum)
?History  ?  ? ?CSN: [id number] ? ?Arrival date and time: 06/06/21 2024 ? ? Event Date/Time  ? First Provider Initiated Contact with Patient 06/06/21 2028   ?  ? ?Chief Complaint  ?Patient presents with  ? Contractions  ? Abdominal Pain  ? ?HPI ?Alexis [name] is a 26 y.o. G3P2002 at [redacted]w[redacted]d who presents to MAU via EMS with chief complaint of contractions. This is a new problem, onset this morning and worsening throughout the day. Pain score 10/10. She denies aggravating or alleviating factors. She has not taken medication for this complaint.  ? ?Patient endorses recurrent severe vomiting. She estimates that she is vomiting upwards of ten times per day "and vomiting up my stomach lining". She does not have medication for this complaint. She reports remote history of THC use. She also reports cigarette smoking. ? ?Patient denies SI, HI, IPV. She previously experienced IPV but states she is no longer with that partner. She reports having a new partner with whom she feels safe. ? ?Patient receives care with [facility]. ? ?OB History   ? ? Gravida  ?3  ? Para  ?2  ? Term  ?2  ? Preterm  ?   ? AB  ?   ? Living  ?2  ?  ? ? SAB  ?   ? IAB  ?   ? Ectopic  ?   ? Multiple  ?0  ? Live Births  ?1  ?   ?  ?  ? ? ?Past Medical History:  ?Diagnosis Date  ? Beta thalassemia trait   ? Family history of breast cancer in mother   ? Gonorrhea 12/2020  ? treated  ? Seizures ([facility])   ? last seizure in 2021  ? Shingles outbreak 10/2018  ? Trichomonas infection 02/2021  ? ? ?Past Surgical History:  ?Procedure Laterality Date  ? NO PAST SURGERIES    ? ? ?Family History  ?Problem Relation Age of Onset  ? Cancer Mother 11  ?     breast  ? Heart disease Father   ? Heart disease Paternal Grandmother   ? Heart disease Paternal Grandfather   ? ? ?Social History  ? ?Tobacco Use  ? Smoking status: Every Day  ?  Packs/day: 0.25  ?  Types: Cigarettes  ?  Passive exposure: Never  ? Smokeless tobacco: Never  ?Vaping Use  ? Vaping Use: Never used   ?Substance Use Topics  ? Alcohol use: No  ? Drug use: Not Currently  ? ? ?Allergies: No Known Allergies ? ?Medications Prior to Admission  ?Medication Sig Dispense Refill Last Dose  ? amoxicillin (AMOXIL) 500 MG capsule Take 1 capsule (500 mg total) by mouth 2 (two) times daily for 10 days. 20 capsule 0   ? Doxylamine-Pyridoxine (DICLEGIS) 10-10 MG TBEC Take 2 tabs at bedtime. If needed, add another tab in the morning. If needed, add another tab in the afternoon, up to 4 tabs/day. (Patient not taking: Reported on 04/19/2021) 100 tablet 0   ? ferrous sulfate 325 (65 FE) MG EC tablet Take 1 tablet (325 mg total) by mouth every other day. 30 tablet 2   ? polyethylene glycol powder (GLYCOLAX/MIRALAX) 17 GM/SCOOP powder Take 17 g by mouth daily as needed for moderate constipation. 500 g 2   ? Prenatal Vit-Fe Fumarate-FA (PRENATAL PLUS VITAMIN/MINERAL) 27-1 MG TABS Take 1 tablet by mouth daily. 30 tablet 11   ? promethazine (PHENERGAN) 25 MG tablet Take 1 tablet (25  mg total) by mouth every 6 (six) hours as needed for nausea or vomiting. 30 tablet 2   ? vitamin C (ASCORBIC ACID) 250 MG tablet Take 1 tablet (250 mg total) by mouth daily. 60 tablet 1   ? ? ?Review of Systems  ?Gastrointestinal:  Positive for abdominal pain, nausea and vomiting.  ?All other systems reviewed and are negative. ?Physical Exam  ? ?Blood pressure 100/81, pulse 70, temperature 98.6 ?F (37 ?C), temperature source Oral, resp. rate 17, height 5\' 5"  (1.651 m), last menstrual period 02/05/2021, SpO2 100 %. ? ?Physical Exam ?Vitals and nursing note reviewed. Exam conducted with a chaperone present.  ?Constitutional:   ?   Appearance: She is well-developed. She is ill-appearing.  ?Cardiovascular:  ?   Rate and Rhythm: Normal rate and regular rhythm.  ?   Heart sounds: Normal heart sounds.  ?Pulmonary:  ?   Effort: Pulmonary effort is normal.  ?   Breath sounds: Normal breath sounds.  ?Abdominal:  ?   General: Abdomen is flat. Bowel sounds are normal.  ?    Palpations: Abdomen is soft.  ?   Tenderness: There is no abdominal tenderness. There is no guarding.  ?Genitourinary: ?   Comments: Pelvic exam: External genitalia normal, vaginal walls pink and well rugated, cervix visually closed, no lesions noted. Thin whitish blue discharge visible on labia, at introitus and throughout vault.  ? ? ?Skin: ?   Capillary Refill: Capillary refill takes less than 2 seconds.  ?Neurological:  ?   Mental Status: She is alert and oriented to person, place, and time.  ?Psychiatric:     ?   Mood and Affect: Mood normal.     ?   Behavior: Behavior normal.  ? ? ?MAU Course  ?Procedures ? ?MDM ?Orders Placed This Encounter  ?Procedures  ? Wet prep, genital  ? Urinalysis, Routine w reflex microscopic Urine, Clean Catch  ? Rapid urine drug screen (hospital performed)  ? ?Patient Vitals for the past 24 hrs: ? BP Temp Temp src Pulse Resp SpO2 Height  ?06/06/21 2034 100/81 98.6 ?F (37 ?C) Oral 70 17 100 % 5\' 5"  (1.651 m)  ? ?Meds ordered this encounter  ?Medications  ? lactated ringers bolus 1,000 mL  ? haloperidol lactate (HALDOL) injection 2 mg  ? diphenhydrAMINE (BENADRYL) capsule 50 mg  ? ?Report given to C. Maryruth Hancock, CNM who assumes care of patient at this time ? ?Mallie Snooks, MSA, MSN, CNM ?Certified Nurse Midwife, [place] [facility] [employer], [place] ?06/06/21 ?9:01 PM ? ?Patient reports feeling better after medications and fluids. No episodes of vomiting while in MAU ? ?Assessment and Plan  ? ?1. Cannabis hyperemesis syndrome concurrent with and due to cannabis dependence ([place])   ?2. [redacted] weeks gestation of pregnancy   ? ?-Discharge home in stable condition ?-Second trimester precautions discussed ?-Patient advised to follow-up with OB as scheduled for prenatal care ?-Patient may return to MAU as needed or if her condition were to change or worsen ? ?Wende Mott, CNM ?06/07/21 ?1:13 AM ? ?

## 2021-06-07 ENCOUNTER — Telehealth (HOSPITAL_BASED_OUTPATIENT_CLINIC_OR_DEPARTMENT_OTHER): Payer: Self-pay | Admitting: Obstetrics & Gynecology

## 2021-06-07 NOTE — Telephone Encounter (Signed)
Called patient and left several messages and sent her my chart message about what's the best number to reach her at. ?

## 2021-06-08 LAB — GC/CHLAMYDIA PROBE AMP (~~LOC~~) NOT AT ARMC
Chlamydia: NEGATIVE
Comment: NEGATIVE
Comment: NORMAL
Neisseria Gonorrhea: NEGATIVE

## 2021-06-10 ENCOUNTER — Encounter (HOSPITAL_BASED_OUTPATIENT_CLINIC_OR_DEPARTMENT_OTHER): Payer: Self-pay | Admitting: *Deleted

## 2021-06-10 DIAGNOSIS — D563 Thalassemia minor: Secondary | ICD-10-CM | POA: Insufficient documentation

## 2021-06-15 ENCOUNTER — Ambulatory Visit
Admission: EM | Admit: 2021-06-15 | Discharge: 2021-06-15 | Disposition: A | Discharge status: Home / Self Care | Payer: Medicaid Other | Attending: Urgent Care | Admitting: Urgent Care

## 2021-06-15 DIAGNOSIS — H60393 Other infective otitis externa, bilateral: Secondary | ICD-10-CM | POA: Diagnosis not present

## 2021-06-15 DIAGNOSIS — H9203 Otalgia, bilateral: Secondary | ICD-10-CM | POA: Diagnosis not present

## 2021-06-15 DIAGNOSIS — J309 Allergic rhinitis, unspecified: Secondary | ICD-10-CM | POA: Diagnosis not present

## 2021-06-15 DIAGNOSIS — Z3A19 19 weeks gestation of pregnancy: Secondary | ICD-10-CM | POA: Diagnosis not present

## 2021-06-15 MED ORDER — ACETAMINOPHEN 325 MG PO TABS
975.0000 mg | ORAL_TABLET | Freq: Once | ORAL | Status: AC
Start: 2021-06-15 — End: 2021-06-15
  Administered 2021-06-15: 975 mg via ORAL

## 2021-06-15 MED ORDER — CETIRIZINE HCL 10 MG PO TABS: 10.0000 mg | ORAL_TABLET | Freq: Every day | ORAL | 0 refills | Status: DC

## 2021-06-15 MED ORDER — FLUTICASONE PROPIONATE 50 MCG/ACT NA SUSP: 2.0000 | Freq: Every day | NASAL | 12 refills | Status: DC

## 2021-06-15 MED ORDER — OFLOXACIN 0.3 % OT SOLN: 5.0000 [drp] | Freq: Two times a day (BID) | OTIC | 0 refills | Status: DC

## 2021-06-15 NOTE — ED Triage Notes (Signed)
2-3 day h/o bilateral ear pain and HA. Notes nausea, decreased appetite and difficulty sleeping. Describes ear pain as pressure behind them. Pt is pregnant, so she has been taking tylenol and allergy meds w/o relief.  ?

## 2021-06-15 NOTE — Discharge Instructions (Addendum)
Please make sure you follow up with your obstetrician to request the headache medications you want. Unfortunately, do to your pregnancy I am not able to prescribe the controlled substances for this.  However your obstetrician may be willing to.  I recommend use Tylenol at a dose of 500 to 1000 mg once every 6-8 hours.  This medication is safe in pregnancy.  I do want her to start Flonase and Zyrtec as well to help with possible inner ear ear tube inflammation called eustachian tube dysfunction.  This can contribute to some of the ear pain.  To address an ear infection of the ear canal I recommend using ofloxacin otic.  This is an antibiotic to help you with that ear pain. ?

## 2021-06-15 NOTE — ED Provider Notes (Signed)
?[facility] ? ? ?MRN: [id number] DOB: 21-Mar-1995 ? ?Subjective:  ? ?Alexis [name] is a 26 y.o. female presenting for 2-3 day history of acute onset severe bilateral ear pains, frontal headaches. Feels significant pressure behind her ears.  Patient is [redacted] weeks pregnant.  Has been using Tylenol with minimal relief.  Has a history of migraines and is requesting butalbital.  No runny or stuffy nose, cough, chest pain, shortness of breath or wheezing. ? ?No current facility-administered medications for this encounter. ? ?Current Outpatient Medications:  ?  Doxylamine-Pyridoxine (DICLEGIS) 10-10 MG TBEC, Take 2 tabs at bedtime. If needed, add another tab in the morning. If needed, add another tab in the afternoon, up to 4 tabs/day. (Patient not taking: Reported on 04/19/2021), Disp: 100 tablet, Rfl: 0 ?  ferrous sulfate 325 (65 FE) MG EC tablet, Take 1 tablet (325 mg total) by mouth every other day., Disp: 30 tablet, Rfl: 2 ?  polyethylene glycol powder (GLYCOLAX/MIRALAX) 17 GM/SCOOP powder, Take 17 g by mouth daily as needed for moderate constipation., Disp: 500 g, Rfl: 2 ?  Prenatal Vit-Fe Fumarate-FA (PRENATAL PLUS VITAMIN/MINERAL) 27-1 MG TABS, Take 1 tablet by mouth daily., Disp: 30 tablet, Rfl: 11 ?  promethazine (PHENERGAN) 25 MG tablet, Take 1 tablet (25 mg total) by mouth every 6 (six) hours as needed for nausea or vomiting., Disp: 30 tablet, Rfl: 2 ?  vitamin C (ASCORBIC ACID) 250 MG tablet, Take 1 tablet (250 mg total) by mouth daily., Disp: 60 tablet, Rfl: 1  ? ?No Known Allergies ? ?Past Medical History:  ?Diagnosis Date  ? Beta thalassemia trait   ? Family history of breast cancer in mother   ? Gonorrhea 12/2020  ? treated  ? Marijuana abuse   ? Seizures (HCC)   ? last seizure in 2021  ? Shingles outbreak 10/2018  ? Trichomonas infection 02/2021  ?  ? ?Past Surgical History:  ?Procedure Laterality Date  ? NO PAST SURGERIES    ? ? ?Family History  ?Problem Relation Age of Onset  ? Cancer  Mother 73  ?     breast  ? Heart disease Father   ? Heart disease Paternal Grandmother   ? Heart disease Paternal Grandfather   ? ? ?Social History  ? ?Tobacco Use  ? Smoking status: Every Day  ?  Packs/day: 0.25  ?  Types: Cigarettes  ?  Passive exposure: Never  ? Smokeless tobacco: Never  ?Vaping Use  ? Vaping Use: Never used  ?Substance Use Topics  ? Alcohol use: No  ? Drug use: Not Currently  ? ? ?ROS ? ? ?Objective:  ? ?Vitals: ?BP (!) 102/59 (BP Location: Right Arm)   Pulse 90   Temp (!) 97.5 ?F (36.4 ?C) (Oral)   Resp 18   LMP 02/05/2021   SpO2 100%  ? ?Physical Exam ?Constitutional:   ?   General: She is not in acute distress. ?   Appearance: Normal appearance. She is well-developed and normal weight. She is not ill-appearing, toxic-appearing or diaphoretic.  ?HENT:  ?   Head: Normocephalic and atraumatic.  ?   Right Ear: Tympanic membrane, ear canal and external ear normal. No drainage or tenderness. No middle ear effusion. There is no impacted cerumen. Tympanic membrane is not erythematous.  ?   Left Ear: Tympanic membrane, ear canal and external ear normal. No drainage or tenderness.  No middle ear effusion. There is no impacted cerumen. Tympanic membrane is not erythematous.  ?  Ears:  ?   Comments: Exquisite tenderness of the ear canals worse over the left side with the use of the otoscope. ?   Nose: No congestion or rhinorrhea.  ?   Mouth/Throat:  ?   Mouth: Mucous membranes are moist. No oral lesions.  ?   Pharynx: No pharyngeal swelling, oropharyngeal exudate, posterior oropharyngeal erythema or uvula swelling.  ?   Tonsils: No tonsillar exudate or tonsillar abscesses.  ?Eyes:  ?   General: No scleral icterus.    ?   Right eye: No discharge.     ?   Left eye: No discharge.  ?   Extraocular Movements: Extraocular movements intact.  ?   Right eye: Normal extraocular motion.  ?   Left eye: Normal extraocular motion.  ?   Conjunctiva/sclera: Conjunctivae normal.  ?Cardiovascular:  ?   Rate and  Rhythm: Normal rate.  ?Pulmonary:  ?   Effort: Pulmonary effort is normal.  ?Musculoskeletal:  ?   Cervical back: Normal range of motion and neck supple.  ?Lymphadenopathy:  ?   Cervical: No cervical adenopathy.  ?Skin: ?   General: Skin is warm and dry.  ?Neurological:  ?   General: No focal deficit present.  ?   Mental Status: She is alert and oriented to person, place, and time.  ?   Cranial Nerves: No cranial nerve deficit.  ?   Motor: No weakness.  ?   Coordination: Coordination normal.  ?   Gait: Gait normal.  ?   Deep Tendon Reflexes: Reflexes normal.  ?Psychiatric:     ?   Mood and Affect: Mood normal.     ?   Behavior: Behavior normal.  ? ? ?Assessment and Plan :  ? ?PDMP not reviewed this encounter. ? ?1. Infective otitis externa of both ears   ?2. Acute otalgia, bilateral   ?3. [redacted] weeks gestation of pregnancy   ?4. Allergic rhinitis, unspecified seasonality, unspecified trigger   ? ?No signs of an acute encephalopathy.  Patient is pregnant and therefore I declined to fill DEA controlled substance medications as she requested.  Emphasized need for follow-up with her obstetrician to get these prescriptions.  No signs of otitis media.  Her ear canals actually benign but given the exquisite tenderness she had with palpation of the ear canal using the otoscope I offered ofloxacin otic to address a possible otitis externa.  Use Flonase, Zyrtec otherwise. Counseled patient on potential for adverse effects with medications prescribed/recommended today, ER and return-to-clinic precautions discussed, patient verbalized understanding. ? ?  ?Wallis Bamberg, PA-C ?06/15/21 1237 ? ?

## 2021-06-17 ENCOUNTER — Ambulatory Visit (INDEPENDENT_AMBULATORY_CARE_PROVIDER_SITE_OTHER): Payer: Medicaid Other | Admitting: Obstetrics & Gynecology

## 2021-06-17 VITALS — BP 115/65 | HR 77 | Wt 146.6 lb

## 2021-06-17 DIAGNOSIS — Z3A18 18 weeks gestation of pregnancy: Secondary | ICD-10-CM

## 2021-06-17 DIAGNOSIS — R634 Abnormal weight loss: Secondary | ICD-10-CM

## 2021-06-17 DIAGNOSIS — D563 Thalassemia minor: Secondary | ICD-10-CM

## 2021-06-17 DIAGNOSIS — Z8669 Personal history of other diseases of the nervous system and sense organs: Secondary | ICD-10-CM

## 2021-06-17 DIAGNOSIS — G43709 Chronic migraine without aura, not intractable, without status migrainosus: Secondary | ICD-10-CM

## 2021-06-17 DIAGNOSIS — Z3482 Encounter for supervision of other normal pregnancy, second trimester: Secondary | ICD-10-CM

## 2021-06-17 DIAGNOSIS — R112 Nausea with vomiting, unspecified: Secondary | ICD-10-CM

## 2021-06-17 MED ORDER — METOCLOPRAMIDE HCL 10 MG PO TABS: ORAL_TABLET | ORAL | 0 refills | Status: DC

## 2021-06-17 MED ORDER — ONDANSETRON HCL 4 MG PO TABS: 4.0000 mg | ORAL_TABLET | Freq: Three times a day (TID) | ORAL | 0 refills | Status: DC | PRN

## 2021-06-17 NOTE — Progress Notes (Signed)
? ?PRENATAL VISIT NOTE ? ?Subjective:  ?Alexis [name] is a 26 y.o. G3P2002 at [redacted]w[redacted]d being seen today for ongoing prenatal care.  She is currently monitored for the following issues for this low-risk pregnancy and has Depression; Supervision of high risk pregnancy, antepartum; History of trichomoniasis; Seizure disorder (HCC); Smoker; History of gonorrhea; Rubella non-immune status, antepartum; Anemia in pregnancy; and 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 on their problem list. ? ?Patient reports  continued nausea.  Has lost about 10 pounds with pregnancy.  Does not feel diclegis helped.  Phenergan makes her feel funny.  Is sure she wants this to be her last pregnancy.  Ready to sign tubal papers when appropriate .  Contractions: Not present. Vag. Bleeding: None.  Movement: Absent. Denies leaking of fluid.  ? ?Pt has bene receiving a lot of care in the MAU or at urgent care.  Will adjust pt's appointments to try and see her on days that work for her schedule. ? ?The following portions of the patient's history were reviewed and updated as appropriate: allergies, current medications, past family history, past medical history, past social history, past surgical history and problem list.  ? ?Objective:  ? ?Vitals:  ? 06/17/21 1324  ?BP: 115/65  ?Pulse: 77  ?Weight: 146 lb 9.6 oz (66.5 kg)  ? ? ?Fetal Status: Fetal Heart Rate (bpm): 145 Fundal Height: 18 cm Movement: Absent    ? ?General:  Alert, oriented and cooperative. Patient is in no acute distress.  ?Skin: Skin is warm and dry. No rash noted.   ?Cardiovascular: Normal heart rate noted  ?Respiratory: Normal respiratory effort, no problems with respiration noted  ?Abdomen: Soft, gravid, appropriate for gestational age.  Pain/Pressure: Present     ?Pelvic: Cervical exam deferred        ?Extremities: Normal range of motion.  Edema: None  ?Mental Status: Normal mood and affect. Normal behavior. Normal judgment and thought content.  ? ?Assessment and Plan:  ?Pregnancy:  G3P2002 at [redacted]w[redacted]d ?1. Encounter for supervision of other normal pregnancy in second trimester ?- on PNV ?- has anatomy scan scheduled tomorrow ?- recheck 4 weeks ? ?2. [redacted] weeks gestation of pregnancy ?- AFP, Serum, Open Spina Bifida ? ?3. Chronic migraine without aura without status migrainosus, not intractable ?- metoCLOPramide (REGLAN) 10 MG tablet; Take 1 tablet with onset of headache  Dispense: 20 tablet; Refill: 0 ? ?4. Nausea and vomiting, unspecified vomiting type ?- will try zofran for pt ?- ondansetron (ZOFRAN) 4 MG tablet; Take 1 tablet (4 mg total) by mouth every 8 (eight) hours as needed for nausea or vomiting.  Dispense: 30 tablet; Refill: 0  ? ?5.  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 ? ?6. History of seizure disorder ?- pt willing to see Dr. Karel Jarvis.  Missed prior appointment.  Will reach out to Dr. Karel Jarvis again. ? ?7. Weight loss ?  ? ?Preterm labor symptoms and general obstetric precautions including but not limited to vaginal bleeding, contractions, leaking of fluid and fetal movement were reviewed in detail with the patient. ?Please refer to After Visit Summary for other counseling recommendations.  ? ?Return in about 4 weeks (around 07/15/2021). ? ?Future Appointments  ?Date Time Provider Department Center  ?07/01/2021  1:00 PM [facility] GENETIC COUNSELING [facility]  ?07/15/2021 11:30 AM Jerene Bears, MD [facility]  ?08/12/2021 11:45 AM Jerene Bears, MD [facility]  ?08/27/2021  8:15 AM [facility] NURSE [facility]  ?08/27/2021  9:30 AM [facility] US3 [facility]  ?09/09/2021 10:45 AM  Jerene Bears, MD [facility]  ?10/14/2021 11:15 AM Jerene Bears, MD [facility]  ?10/21/2021 11:15 AM Jerene Bears, MD [facility]  ?10/28/2021 10:45 AM Jerene Bears, MD [facility]  ?11/04/2021 10:45 AM Jerene Bears, MD [facility]  ? ? ?Jerene Bears, MD  ?

## 2021-06-18 ENCOUNTER — Other Ambulatory Visit: Payer: Self-pay | Admitting: *Deleted

## 2021-06-18 ENCOUNTER — Other Ambulatory Visit (HOSPITAL_BASED_OUTPATIENT_CLINIC_OR_DEPARTMENT_OTHER): Payer: Self-pay | Admitting: Obstetrics & Gynecology

## 2021-06-18 ENCOUNTER — Ambulatory Visit: Payer: Medicaid Other | Attending: Obstetrics & Gynecology

## 2021-06-18 DIAGNOSIS — O99332 Smoking (tobacco) complicating pregnancy, second trimester: Secondary | ICD-10-CM | POA: Diagnosis not present

## 2021-06-18 DIAGNOSIS — Z363 Encounter for antenatal screening for malformations: Secondary | ICD-10-CM | POA: Insufficient documentation

## 2021-06-18 DIAGNOSIS — O99352 Diseases of the nervous system complicating pregnancy, second trimester: Secondary | ICD-10-CM | POA: Insufficient documentation

## 2021-06-18 DIAGNOSIS — Z148 Genetic carrier of other disease: Secondary | ICD-10-CM | POA: Insufficient documentation

## 2021-06-18 DIAGNOSIS — Z3A10 10 weeks gestation of pregnancy: Secondary | ICD-10-CM | POA: Diagnosis not present

## 2021-06-18 DIAGNOSIS — F17210 Nicotine dependence, cigarettes, uncomplicated: Secondary | ICD-10-CM | POA: Insufficient documentation

## 2021-06-18 DIAGNOSIS — D563 Thalassemia minor: Secondary | ICD-10-CM

## 2021-06-18 DIAGNOSIS — O321XX0 Maternal care for breech presentation, not applicable or unspecified: Secondary | ICD-10-CM | POA: Insufficient documentation

## 2021-06-18 DIAGNOSIS — Z3481 Encounter for supervision of other normal pregnancy, first trimester: Secondary | ICD-10-CM | POA: Diagnosis not present

## 2021-06-18 DIAGNOSIS — G40909 Epilepsy, unspecified, not intractable, without status epilepticus: Secondary | ICD-10-CM | POA: Diagnosis not present

## 2021-06-18 DIAGNOSIS — Z3A19 19 weeks gestation of pregnancy: Secondary | ICD-10-CM | POA: Insufficient documentation

## 2021-06-19 LAB — AFP, SERUM, OPEN SPINA BIFIDA
AFP MoM: 1.39
AFP Value: 69.8 ng/mL
Gest. Age on Collection Date: 18 weeks
Maternal Age At EDD: 27.8 yr
OSBR Risk 1 IN: 7407
Test Results:: NEGATIVE
Weight: 146 [lb_av]

## 2021-06-28 ENCOUNTER — Telehealth (HOSPITAL_BASED_OUTPATIENT_CLINIC_OR_DEPARTMENT_OTHER): Payer: Self-pay

## 2021-06-28 NOTE — Telephone Encounter (Signed)
Patient called in today complaining of pain. Patient states that she is [redacted] weeks pregnant and is contracting. Patient states that the contractions are about 6-7 minutes apart. Patient was advised not to come to the [facility] ER but to go to [facility]. [facility] tbw ?

## 2021-07-01 ENCOUNTER — Ambulatory Visit: Payer: Medicaid Other

## 2021-07-12 ENCOUNTER — Other Ambulatory Visit (HOSPITAL_BASED_OUTPATIENT_CLINIC_OR_DEPARTMENT_OTHER): Payer: Self-pay | Admitting: Obstetrics & Gynecology

## 2021-07-12 DIAGNOSIS — R112 Nausea with vomiting, unspecified: Secondary | ICD-10-CM

## 2021-07-12 DIAGNOSIS — G43709 Chronic migraine without aura, not intractable, without status migrainosus: Secondary | ICD-10-CM

## 2021-07-13 MED ORDER — ONDANSETRON HCL 4 MG PO TABS: 4.0000 mg | ORAL_TABLET | Freq: Three times a day (TID) | ORAL | 0 refills | Status: DC | PRN

## 2021-07-13 MED ORDER — METOCLOPRAMIDE HCL 10 MG PO TABS: ORAL_TABLET | ORAL | 0 refills | Status: DC

## 2021-07-15 ENCOUNTER — Ambulatory Visit (INDEPENDENT_AMBULATORY_CARE_PROVIDER_SITE_OTHER): Payer: Medicaid Other | Admitting: Obstetrics & Gynecology

## 2021-07-15 VITALS — BP 111/71 | HR 88 | Wt 149.6 lb

## 2021-07-15 DIAGNOSIS — Z8619 Personal history of other infectious and parasitic diseases: Secondary | ICD-10-CM

## 2021-07-15 DIAGNOSIS — R87612 Low grade squamous intraepithelial lesion on cytologic smear of cervix (LGSIL): Secondary | ICD-10-CM

## 2021-07-15 DIAGNOSIS — O99011 Anemia complicating pregnancy, first trimester: Secondary | ICD-10-CM

## 2021-07-15 DIAGNOSIS — O0990 Supervision of high risk pregnancy, unspecified, unspecified trimester: Secondary | ICD-10-CM

## 2021-07-15 DIAGNOSIS — F17200 Nicotine dependence, unspecified, uncomplicated: Secondary | ICD-10-CM

## 2021-07-15 DIAGNOSIS — D563 Thalassemia minor: Secondary | ICD-10-CM

## 2021-07-15 DIAGNOSIS — O09899 Supervision of other high risk pregnancies, unspecified trimester: Secondary | ICD-10-CM

## 2021-07-15 DIAGNOSIS — Z2839 Other underimmunization status: Secondary | ICD-10-CM

## 2021-07-15 DIAGNOSIS — Z3A22 22 weeks gestation of pregnancy: Secondary | ICD-10-CM

## 2021-07-15 NOTE — Progress Notes (Signed)
   PRENATAL VISIT NOTE  Subjective:  Alexis [name] is a 26 y.o. G3P2002 at [redacted]w[redacted]d being seen today for ongoing prenatal care.  She is currently monitored for the following issues for this high-risk pregnancy and has Depression; Supervision of high risk pregnancy, antepartum; History of trichomoniasis; Seizure disorder ([place]); Smoker; History of gonorrhea; Rubella non-immune status, antepartum; Anemia in pregnancy; and 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 on their problem list.  Patient reports she is still having some nausea.  Does use the metoclopramide and zofran prn.  Contractions: Not present. Vag. Bleeding: None.  Movement: Present. Denies leaking of fluid.   The following portions of the patient's history were reviewed and updated as appropriate: allergies, current medications, past family history, past medical history, past social history, past surgical history and problem list.   Objective:   Vitals:   07/15/21 1057  BP: 111/71  Pulse: 88  Weight: 149 lb 9.6 oz (67.9 kg)    Fetal Status: Fetal Heart Rate (bpm): 151   Movement: Present     General:  Alert, oriented and cooperative. Patient is in no acute distress.  Skin: Skin is warm and dry. No rash noted.   Cardiovascular: Normal heart rate noted  Respiratory: Normal respiratory effort, no problems with respiration noted  Abdomen: Soft, gravid, appropriate for gestational age.  Pain/Pressure: Present     Pelvic: Cervical exam deferred        Extremities: Normal range of motion.  Edema: None  Mental Status: Normal mood and affect. Normal behavior. Normal judgment and thought content.   Assessment and Plan:  Pregnancy: G3P2002 at [redacted]w[redacted]d 1. [redacted] weeks gestation of pregnancy  2. Supervision of high risk pregnancy, antepartum - on PNV - recheck 4 weeks.  GTT, Tdap, and labs will be done at next visit.  3. 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 - declines genetic testing.  FOB will not be tested at this time due to no insurance.  4. Rubella  non-immune status, antepartum - will vaccinate PP  5. Anemia during pregnancy in first trimester - on iron.  Plan to repeat CBC at next visit.  IV iron discussed with pt.  6. History of trichomoniasis - negative testing at initial prenatal visit  7. History of gonorrhea - treated and has neg repeat testing  8. Smoker - discussed importance of stopping if at all possible.  Has growth scan scheduled due to smoking with pregnancy at [facility]FM.   Preterm labor symptoms and general obstetric precautions including but not limited to vaginal bleeding, contractions, leaking of fluid and fetal movement were reviewed in detail with the patient. Please refer to After Visit Summary for other counseling recommendations.   Return in about 4 weeks (around 08/12/2021) for 2 hr GTT, 3rd trimester labs (RPR, HIV, CBC), Tdap.  Future Appointments  Date Time Provider [place]  08/12/2021 11:45 AM Megan Salon, MD [facility]  08/27/2021  8:15 AM [facility] NURSE [facility]  08/27/2021  9:30 AM [facility] US3 [facility]  09/09/2021 10:45 AM Megan Salon, MD [facility]  10/14/2021 11:15 AM Megan Salon, MD [facility]  10/21/2021 11:15 AM Megan Salon, MD [facility]  10/28/2021 10:45 AM Megan Salon, MD [facility]  11/04/2021 10:45 AM Megan Salon, MD [facility]    Megan Salon, MD

## 2021-07-16 ENCOUNTER — Telehealth: Payer: Self-pay | Admitting: Neurology

## 2021-07-16 NOTE — Telephone Encounter (Signed)
Called pt to schedule an appointment with Dr. Karel Jarvis. Left message on the pt's machine to give our office a call to schedule.

## 2021-07-23 NOTE — BH Specialist Note (Signed)
Pt did not arrive to video visit and did not answer the phone; Left HIPPA-compliant message to call back Tametra Ahart from [facility] at  [phone] (Jeffory Snelgrove's office).  ?; left MyChart message for patient.  ? ?

## 2021-07-29 ENCOUNTER — Encounter (HOSPITAL_COMMUNITY): Payer: Self-pay | Admitting: Obstetrics & Gynecology

## 2021-07-29 ENCOUNTER — Inpatient Hospital Stay (HOSPITAL_BASED_OUTPATIENT_CLINIC_OR_DEPARTMENT_OTHER): Payer: Medicaid Other

## 2021-07-29 ENCOUNTER — Inpatient Hospital Stay (HOSPITAL_COMMUNITY): Payer: Medicaid Other

## 2021-07-29 ENCOUNTER — Observation Stay (HOSPITAL_COMMUNITY)
Admission: AD | Admit: 2021-07-29 | Discharge: 2021-07-29 | Disposition: A | Discharge status: Home / Self Care | Payer: Medicaid Other | Attending: Obstetrics and Gynecology | Admitting: Obstetrics and Gynecology

## 2021-07-29 DIAGNOSIS — K8020 Calculus of gallbladder without cholecystitis without obstruction: Secondary | ICD-10-CM | POA: Diagnosis not present

## 2021-07-29 DIAGNOSIS — R109 Unspecified abdominal pain: Secondary | ICD-10-CM

## 2021-07-29 DIAGNOSIS — Z3A24 24 weeks gestation of pregnancy: Secondary | ICD-10-CM | POA: Diagnosis not present

## 2021-07-29 DIAGNOSIS — O99332 Smoking (tobacco) complicating pregnancy, second trimester: Secondary | ICD-10-CM | POA: Diagnosis not present

## 2021-07-29 DIAGNOSIS — O26892 Other specified pregnancy related conditions, second trimester: Secondary | ICD-10-CM

## 2021-07-29 DIAGNOSIS — O9A312 Physical abuse complicating pregnancy, second trimester: Secondary | ICD-10-CM | POA: Diagnosis not present

## 2021-07-29 DIAGNOSIS — Z79899 Other long term (current) drug therapy: Secondary | ICD-10-CM | POA: Insufficient documentation

## 2021-07-29 DIAGNOSIS — R110 Nausea: Secondary | ICD-10-CM | POA: Diagnosis not present

## 2021-07-29 DIAGNOSIS — F17210 Nicotine dependence, cigarettes, uncomplicated: Secondary | ICD-10-CM | POA: Insufficient documentation

## 2021-07-29 DIAGNOSIS — O26899 Other specified pregnancy related conditions, unspecified trimester: Secondary | ICD-10-CM | POA: Diagnosis present

## 2021-07-29 DIAGNOSIS — R1011 Right upper quadrant pain: Secondary | ICD-10-CM | POA: Insufficient documentation

## 2021-07-29 DIAGNOSIS — O9A212 Injury, poisoning and certain other consequences of external causes complicating pregnancy, second trimester: Secondary | ICD-10-CM | POA: Diagnosis present

## 2021-07-29 DIAGNOSIS — Z3A19 19 weeks gestation of pregnancy: Secondary | ICD-10-CM

## 2021-07-29 DIAGNOSIS — O99612 Diseases of the digestive system complicating pregnancy, second trimester: Secondary | ICD-10-CM | POA: Insufficient documentation

## 2021-07-29 DIAGNOSIS — T7491XA Unspecified adult maltreatment, confirmed, initial encounter: Secondary | ICD-10-CM | POA: Diagnosis present

## 2021-07-29 LAB — COMPREHENSIVE METABOLIC PANEL
ALT: 7 U/L (ref 0–44)
AST: 15 U/L (ref 15–41)
Albumin: 3.2 g/dL — ABNORMAL LOW (ref 3.5–5.0)
Alkaline Phosphatase: 46 U/L (ref 38–126)
Anion gap: 8 (ref 5–15)
BUN: 5 mg/dL — ABNORMAL LOW (ref 6–20)
CO2: 23 mmol/L (ref 22–32)
Calcium: 9 mg/dL (ref 8.9–10.3)
Chloride: 107 mmol/L (ref 98–111)
Creatinine, Ser: 0.48 mg/dL (ref 0.44–1.00)
GFR, Estimated: >60 mL/min (ref >60)
Glucose, Bld: 89 mg/dL (ref 70–99)
Potassium: 3.7 mmol/L (ref 3.5–5.1)
Sodium: 138 mmol/L (ref 135–145)
Total Bilirubin: 0.6 mg/dL (ref 0.3–1.2)
Total Protein: 6.3 g/dL — ABNORMAL LOW (ref 6.5–8.1)

## 2021-07-29 LAB — URINALYSIS, ROUTINE W REFLEX MICROSCOPIC
Bilirubin Urine: NEGATIVE
Glucose, UA: NEGATIVE mg/dL
Hgb urine dipstick: NEGATIVE
Ketones, ur: NEGATIVE mg/dL
Nitrite: NEGATIVE
Protein, ur: 30 mg/dL — AB
Specific Gravity, Urine: 1.015 (ref 1.005–1.030)
pH: 6 (ref 5.0–8.0)

## 2021-07-29 LAB — CBC WITH DIFFERENTIAL/PLATELET
Abs Immature Granulocytes: 0.12 10*3/uL — ABNORMAL HIGH (ref 0.00–0.07)
Basophils Absolute: 0 10*3/uL (ref 0.0–0.1)
Basophils Relative: 0 %
Eosinophils Absolute: 0 10*3/uL (ref 0.0–0.5)
Eosinophils Relative: 0 %
HCT: 26.3 % — ABNORMAL LOW (ref 36.0–46.0)
Hemoglobin: 8.9 g/dL — ABNORMAL LOW (ref 12.0–15.0)
Immature Granulocytes: 1 %
Lymphocytes Relative: 7 %
Lymphs Abs: 1 10*3/uL (ref 0.7–4.0)
MCH: 24.5 pg — ABNORMAL LOW (ref 26.0–34.0)
MCHC: 33.8 g/dL (ref 30.0–36.0)
MCV: 72.3 fL — ABNORMAL LOW (ref 80.0–100.0)
Monocytes Absolute: 0.5 10*3/uL (ref 0.1–1.0)
Monocytes Relative: 4 %
Neutro Abs: 12.3 10*3/uL — ABNORMAL HIGH (ref 1.7–7.7)
Neutrophils Relative %: 88 %
Platelets: 277 10*3/uL (ref 150–400)
RBC: 3.64 MIL/uL — ABNORMAL LOW (ref 3.87–5.11)
RDW: 15 % (ref 11.5–15.5)
WBC: 14 10*3/uL — ABNORMAL HIGH (ref 4.0–10.5)
nRBC: 0 % (ref 0.0–0.2)

## 2021-07-29 LAB — OB RESULTS CONSOLE ABO/RH: RH Type: POSITIVE

## 2021-07-29 LAB — TYPE AND SCREEN
ABO/RH(D): O POS
Antibody Screen: NEGATIVE

## 2021-07-29 LAB — AMYLASE: Amylase: 165 U/L — ABNORMAL HIGH (ref 28–100)

## 2021-07-29 LAB — LIPASE, BLOOD: Lipase: 41 U/L (ref 11–51)

## 2021-07-29 MED ORDER — PRENATAL MULTIVITAMIN CH: 1.0000 | ORAL_TABLET | Freq: Every day | ORAL | Status: DC

## 2021-07-29 MED ORDER — ACETAMINOPHEN 325 MG PO TABS: 650.0000 mg | ORAL_TABLET | ORAL | Status: DC | PRN

## 2021-07-29 MED ORDER — HYDROMORPHONE HCL 1 MG/ML IJ SOLN
0.5000 mg | Freq: Once | INTRAMUSCULAR | Status: AC
  Administered 2021-07-29: 0.5 mg via INTRAVENOUS
  Filled 2021-07-29: qty 1

## 2021-07-29 MED ORDER — ONDANSETRON HCL 4 MG/2ML IJ SOLN
4.0000 mg | Freq: Once | INTRAMUSCULAR | Status: AC
  Administered 2021-07-29: 4 mg via INTRAVENOUS
  Filled 2021-07-29: qty 2

## 2021-07-29 MED ORDER — CALCIUM CARBONATE ANTACID 500 MG PO CHEW: 2.0000 | CHEWABLE_TABLET | ORAL | Status: DC | PRN

## 2021-07-29 MED ORDER — LACTATED RINGERS IV BOLUS
1000.0000 mL | Freq: Once | INTRAVENOUS | Status: AC
  Administered 2021-07-29: 1000 mL via INTRAVENOUS

## 2021-07-29 MED ORDER — DOCUSATE SODIUM 100 MG PO CAPS: 100.0000 mg | ORAL_CAPSULE | Freq: Every day | ORAL | Status: DC

## 2021-07-29 MED ORDER — OXYCODONE HCL 5 MG PO TABS
5.0000 mg | ORAL_TABLET | Freq: Four times a day (QID) | ORAL | Status: DC | PRN
  Administered 2021-07-29: 5 mg via ORAL
  Filled 2021-07-29: qty 1

## 2021-07-29 MED ORDER — ZOLPIDEM TARTRATE 5 MG PO TABS: 5.0000 mg | ORAL_TABLET | Freq: Every evening | ORAL | Status: DC | PRN
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
## 2021-07-29 NOTE — Discharge Instructions (Signed)
Soft Tissue Injury of the Neck (Strangulation)  A soft tissue injury of the neck is serious and needs medical care right away.  Some injuries do not break the skin (blunt injury).  Some injuries do break the skin (penetrating injury) and create an open wound.  You may feel fine at first, but the puffiness (swelling) in your throat can slowly make it harder to breathe.  This could cause serious or life-threatening injury.  There could be damage to major blood vessels and nerves in the neck.    Be sure to tell your health care provider how the injury occurred and if someone else caused the injury.  Also, tell the provider if any object or hands were used to cause the injury (such as rope, clothesline, telephone cord, etc).    Home Care Get help right away if: Your voice gets weaker or hoarse Your puffiness or bruising does not get better You have new puffiness or bruising in the face or neck Your pain gets worse  You have trouble swallowing You cough up blood You have trouble breathing You start to drool You start throwing up (vomiting) You have a fever of 102 degrees  Neck Contusion A neck contusion is a deep bruise in the neck. It is caused by a direct force (blunt trauma) to the neck. Although neck contusions can be mild, this type of neck injury can also be quite dangerous because it could affect the important structures in your neck, including: Neck muscles. Large blood vessels (carotid arteries and jugular veins). The bones of the the cervical spine (vertebrate) and the spinal cord nerves. The airway. This includes the voice box (larynx) and the windpipe (trachea). The tube that lets you swallow (esophagus). A neck contusion can cause swelling and bleeding in your neck that can press on your throat and larynx. This can narrow your airway and cause difficulty with breathing (respiratory distress). Contusions may also be associated with other injuries, such as broken bones (fractures) and  cuts (lacerations) to the skin and deeper neck structures. What are the causes? This condition may be caused by: Motor vehicle accidents that cause: Blunt trauma to the neck. Extreme and sudden twisting motion of the head (whiplash). Injuries from the seatbelt across the neck. Sports injuries, such as blows from football, martial arts, wrestling, and hockey. Bicycle injuries. Assault injuries, including choking (strangulation). What are the signs or symptoms? Symptoms of this condition include: Pain. Swelling and discoloration. Bruising or stiffness. Blood accumulation under the skin (hematoma). Other symptoms depend on what structures are affected. A contusion that affects a carotid artery may cause an expanding lump in the neck, as well as: Dizziness. Decreasing consciousness. Weakness on the side of the body that is opposite from the contusion, due to decreased blood flow to the brain. A contusion that affects the airway may cause: Difficulty with breathing. Noisy breathing. A hoarse or weak voice. Coughing up blood. A contusion that affects the esophagus may cause: Difficulty with swallowing. Spitting up blood. How is this diagnosed? This condition is diagnosed based on: A physical exam. Your symptoms. Your history of blunt trauma. You may also have tests to help rule out a more serious injury. Tests may include: X-ray. CT scan. MRI. Angiography. Certain areas of the neck contain more important structures and may require more evaluation than others. Sometimes, more testing may may be needed to check for injuries. this may include a test that allows a health care provider to view the airway from  inside (video laryngoscopy). How is this treated? In most cases, an uncomplicated neck contusion can be treated with home care. This includes rest, ice, and over-the-counter pain medicine, such as ibuprofen. Other treatment depends on possible complications that you may have.  Respiratory distress is the most dangerous complication of neck contusion. This is a medical emergency that requires immediate treatment. Treatment may include having: A breathing tube inserted into your larynx (endotracheal intubation). An emergency procedure to create a hole in your larynx or trachea for a breathing tube (tracheotomy or cricothyrotomy). Surgery to repair any damage to the esophagus or the large blood vessels in the neck. Drainage of a hematoma in your neck. A brace may be used (cervical collar) if you have injured your spine. This will keep your neck from moving and prevent any further injury to your spine. Follow these instructions at home: Managing pain, stiffness, and swelling  If directed, put ice on the injured area: Put ice in a plastic bag. Place a towel between your skin and the bag. Leave the ice on for 20 minutes, 2-3 times a day. Remove the ice if your skin turns bright red. This is very important. If you cannot feel pain, heat, or cold, you have a greater risk of damage to the area. Raise (elevate) the injured area above the level of your heart while you are sitting or lying down. General instructions  Take over-the-counter and prescription medicines only as told by your health care provider. Rest at told by your doctor. Keep your head and neck elevated above the level of your heart while you sleep. If you were given a cervical collar, wear it as told by your health care provider. Do not continue to wear the collar for longer than recommended by your health care provider. Follow instructions from your health care provider about what you can or cannot eat. Often, only fluids and soft foods are recommended until you heal. Keep all follow-up visits. This is important. Contact a health care provider if: Your pain does not get better in 2-3 days. You develop increasing pain or difficulty with swallowing. You develop a fever. Get help right away if: You suddenly  have difficulty breathing. Your swelling gets worse. You have noisy breathing. You cough up blood. You cannot swallow. You vomit. You are dizzy or you faint. You develop a drooping face, sudden weakness on one side of your body, difficulty speaking, or difficulty understanding speech. These symptoms may represent a serious problem that is an emergency. Do not wait to see if the symptoms will go away. Get medical help right away. Call your local emergency services (911 in the [place] Do not drive yourself to the hospital. Summary A neck contusion is a deep bruise in the neck. It is caused by a direct force (blunt trauma) to the neck. This type of neck injury is dangerous because it could affect the important structures in your neck. These include blood vessels, airway structures, bones and spinal cord nerves. Take over-the-counter and prescription medicines only as told by your health care provider. Keep your head and neck at least partially raised (elevated) above the level of your heart. Do this even when you sleep. Get help right away if you have difficulty breathing, cough up blood, cannot swallow, or have other new or worsening symptoms. This information is not intended to replace advice given to you by your health care provider. Make sure you discuss any questions you have with your health care provider. Document Revised:  03/16/2020 Document Reviewed: 03/16/2020 Elsevier Patient Education  2022 Elsevier Inc.ted from [place] Patient Information  [place] Crime Victim's Compensation:  The state advocates (contact information on flyer) or local advocates from a [employer] may be able to assist with completing the application; in order to be considered for assistance; the crime must be reported to law enforcement within 72 hours unless there is good cause for delay; you must fully cooperate with law enforcement and prosecution regarding the case; the crime must have occurred in [place] or in a  state that does not offer crime victim compensation. [employer]

## 2021-07-29 NOTE — MAU Note (Signed)
Spoke with Joni Reining CSW, will come see patient
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
## 2021-07-29 NOTE — MAU Note (Signed)
Pt arrived during Epic downtime. FHR monitoring on paper strip from 0212-0234.
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
## 2021-07-30 ENCOUNTER — Inpatient Hospital Stay (HOSPITAL_COMMUNITY)
Admission: AD | Admit: 2021-07-30 | Discharge: 2021-07-31 | Disposition: A | Discharge status: Home / Self Care | Payer: Medicaid Other | Attending: Obstetrics and Gynecology | Admitting: Obstetrics and Gynecology

## 2021-07-30 DIAGNOSIS — D563 Thalassemia minor: Secondary | ICD-10-CM

## 2021-07-30 DIAGNOSIS — O26612 Liver and biliary tract disorders in pregnancy, second trimester: Secondary | ICD-10-CM | POA: Insufficient documentation

## 2021-07-30 DIAGNOSIS — K8020 Calculus of gallbladder without cholecystitis without obstruction: Secondary | ICD-10-CM | POA: Insufficient documentation

## 2021-07-30 DIAGNOSIS — Z3A25 25 weeks gestation of pregnancy: Secondary | ICD-10-CM | POA: Insufficient documentation

## 2021-07-30 DIAGNOSIS — O0990 Supervision of high risk pregnancy, unspecified, unspecified trimester: Secondary | ICD-10-CM

## 2021-07-30 LAB — CULTURE, OB URINE
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
## 2021-07-31 ENCOUNTER — Encounter (HOSPITAL_COMMUNITY): Payer: Self-pay | Admitting: Obstetrics and Gynecology

## 2021-07-31 DIAGNOSIS — K8020 Calculus of gallbladder without cholecystitis without obstruction: Secondary | ICD-10-CM | POA: Diagnosis present

## 2021-07-31 DIAGNOSIS — O26612 Liver and biliary tract disorders in pregnancy, second trimester: Secondary | ICD-10-CM

## 2021-07-31 DIAGNOSIS — Z3A25 25 weeks gestation of pregnancy: Secondary | ICD-10-CM | POA: Diagnosis not present

## 2021-07-31 LAB — COMPREHENSIVE METABOLIC PANEL
ALT: 8 U/L (ref 0–44)
AST: 15 U/L (ref 15–41)
Albumin: 3.1 g/dL — ABNORMAL LOW (ref 3.5–5.0)
Alkaline Phosphatase: 46 U/L (ref 38–126)
Anion gap: 7 (ref 5–15)
BUN: <5 mg/dL — ABNORMAL LOW (ref 6–20)
CO2: 22 mmol/L (ref 22–32)
Calcium: 8.5 mg/dL — ABNORMAL LOW (ref 8.9–10.3)
Chloride: 106 mmol/L (ref 98–111)
Creatinine, Ser: 0.52 mg/dL (ref 0.44–1.00)
GFR, Estimated: >60 mL/min (ref >60)
Glucose, Bld: 82 mg/dL (ref 70–99)
Potassium: 3.8 mmol/L (ref 3.5–5.1)
Sodium: 135 mmol/L (ref 135–145)
Total Bilirubin: 0.5 mg/dL (ref 0.3–1.2)
Total Protein: 6.1 g/dL — ABNORMAL LOW (ref 6.5–8.1)

## 2021-07-31 LAB — CBC
HCT: 26.5 % — ABNORMAL LOW (ref 36.0–46.0)
Hemoglobin: 8.8 g/dL — ABNORMAL LOW (ref 12.0–15.0)
MCH: 24.2 pg — ABNORMAL LOW (ref 26.0–34.0)
MCHC: 33.2 g/dL (ref 30.0–36.0)
MCV: 73 fL — ABNORMAL LOW (ref 80.0–100.0)
Platelets: 255 10*3/uL (ref 150–400)
RBC: 3.63 MIL/uL — ABNORMAL LOW (ref 3.87–5.11)
RDW: 15.2 % (ref 11.5–15.5)
WBC: 8.1 10*3/uL (ref 4.0–10.5)
nRBC: 0 % (ref 0.0–0.2)

## 2021-07-31 LAB — URINALYSIS, ROUTINE W REFLEX MICROSCOPIC
Bacteria, UA: NONE SEEN
Bilirubin Urine: NEGATIVE
Glucose, UA: NEGATIVE mg/dL
Hgb urine dipstick: NEGATIVE
Ketones, ur: NEGATIVE mg/dL
Nitrite: NEGATIVE
Protein, ur: NEGATIVE mg/dL
Specific Gravity, Urine: 1.015 (ref 1.005–1.030)
pH: 6 (ref 5.0–8.0)

## 2021-07-31 LAB — LIPASE, BLOOD: Lipase: 35 U/L (ref 11–51)

## 2021-07-31 MED ORDER — LACTATED RINGERS IV SOLN: INTRAVENOUS | Status: DC

## 2021-07-31 MED ORDER — ONDANSETRON HCL 4 MG/2ML IJ SOLN
4.0000 mg | Freq: Once | INTRAMUSCULAR | Status: AC
  Administered 2021-07-31: 4 mg via INTRAVENOUS
  Filled 2021-07-31: qty 2

## 2021-07-31 MED ORDER — HYDROMORPHONE HCL 1 MG/ML IJ SOLN
0.5000 mg | Freq: Once | INTRAMUSCULAR | Status: AC
  Administered 2021-07-31: 0.5 mg via INTRAVENOUS
  Filled 2021-07-31: qty 1

## 2021-07-31 MED ORDER — PROMETHAZINE HCL 25 MG PO TABS
25.0000 mg | ORAL_TABLET | Freq: Once | ORAL | Status: AC
  Administered 2021-07-31: 25 mg via ORAL
  Filled 2021-07-31: qty 1

## 2021-07-31 MED ORDER — OXYCODONE HCL 5 MG PO TABS: 5.0000 mg | ORAL_TABLET | Freq: Four times a day (QID) | ORAL | 0 refills | Status: AC | PRN

## 2021-07-31 MED ORDER — HYDROMORPHONE HCL 1 MG/ML IJ SOLN
1.0000 mg | INTRAMUSCULAR | Status: DC | PRN
  Administered 2021-07-31: 1 mg via INTRAVENOUS
  Filled 2021-07-31: qty 1

## 2021-07-31 MED ORDER — PROMETHAZINE HCL 25 MG PO TABS: 25.0000 mg | ORAL_TABLET | Freq: Four times a day (QID) | ORAL | 0 refills | Status: DC | PRN

## 2021-07-31 NOTE — MAU Provider Note (Signed)
History     [id number]  Arrival date and time: 07/30/21 2337    Chief Complaint  Patient presents with   Abdominal Pain     HPI Alexis [name] is a 26 y.o. at [redacted]w[redacted]d who presents for abdominal pain. Was admitted Wednesday after being diagnosed with gallstones; was discharged Thursday. States pain has continued since then & worsened this evening. Pain in right upper quadrant. Tried taking tylenol without relief. Has been vomiting all day as well. Tried to eat a chicken sandwich this morning but couldn't keep it down. Denies fever, contractions, LOF, or vaginal bleeding. Some decreased movement but thinks she hasn't paid attention due to her pain.    OB History     Gravida  3   Para  2   Term  2   Preterm      AB      Living  2      SAB      IAB      Ectopic      Multiple  0   Live Births  1           Past Medical History:  Diagnosis Date   Beta thalassemia trait    Family history of breast cancer in mother    Gonorrhea 12/2020   treated   Marijuana abuse    Seizures ([place])    last seizure in 2021   Shingles outbreak 10/2018   Trichomonas infection 02/2021    Past Surgical History:  Procedure Laterality Date   NO PAST SURGERIES      Family History  Problem Relation Age of Onset   Cancer Mother 42       breast   Heart disease Father    Heart disease Paternal Grandmother    Heart disease Paternal Grandfather     No Known Allergies  No current facility-administered medications on file prior to encounter.   Current Outpatient Medications on File Prior to Encounter  Medication Sig Dispense Refill   acetaminophen (TYLENOL) 325 MG tablet Take 500 mg by mouth every 6 (six) hours as needed.     cetirizine (ZYRTEC ALLERGY) 10 MG tablet Take 1 tablet (10 mg total) by mouth daily. 30 tablet 0   ferrous sulfate 325 (65 FE) MG EC tablet Take 1 tablet (325 mg total) by mouth every other day. 30 tablet 2   fluticasone (FLONASE) 50 MCG/ACT nasal spray  Place 2 sprays into both nostrils daily. 16 g 12   metoCLOPramide (REGLAN) 10 MG tablet Take 1 tablet with onset of headache 20 tablet 0   ondansetron (ZOFRAN) 4 MG tablet Take 1 tablet (4 mg total) by mouth every 8 (eight) hours as needed for nausea or vomiting. 30 tablet 0   Prenatal Vit-Fe Fumarate-FA (PRENATAL PLUS VITAMIN/MINERAL) 27-1 MG TABS Take 1 tablet by mouth daily. 30 tablet 11     ROS Pertinent positives and negative per HPI, all others reviewed and negative  Physical Exam   BP 102/60 (BP Location: Left Arm)   Pulse 96   Temp 98.4 F (36.9 C)   Resp 18   Ht 5\' 5"  (1.651 m)   Wt 67.1 kg   LMP 02/05/2021   SpO2 100%   BMI 24.63 kg/m   Patient Vitals for the past 24 hrs:  BP Temp Pulse Resp SpO2 Height Weight  07/31/21 0021 102/60 -- 96 -- -- -- --  07/30/21 2358 121/80 -- -- -- -- -- --  07/30/21 2355 -- 98.4  F (36.9 C) (!) 116 18 100 % 5\' 5"  (1.651 m) 67.1 kg    Physical Exam Vitals and nursing note reviewed.  Constitutional:      General: She is in acute distress (crying).     Appearance: She is well-developed.  Eyes:     General: No scleral icterus.    Pupils: Pupils are equal, round, and reactive to light.  Pulmonary:     Effort: Pulmonary effort is normal. No respiratory distress.  Abdominal:     Palpations: Abdomen is soft.     Tenderness: There is abdominal tenderness in the right upper quadrant. There is no guarding or rebound. Positive signs include Murphy's sign.  Skin:    General: Skin is warm and dry.  Neurological:     Mental Status: She is alert.  Psychiatric:        Mood and Affect: Mood normal.        Behavior: Behavior normal.       FHT Baseline 140, moderate variability, 10x10 accels, no decels Toco: irregular UI Cat: 1  Labs Results for orders placed or performed during the hospital encounter of 07/30/21 (from the past 24 hour(s))  Urinalysis, Routine w reflex microscopic Urine, Clean Catch     Status: Abnormal    Collection Time: 07/31/21 12:10 AM  Result Value Ref Range   Color, Urine YELLOW YELLOW   APPearance HAZY (A) CLEAR   Specific Gravity, Urine 1.015 1.005 - 1.030   pH 6.0 5.0 - 8.0   Glucose, UA NEGATIVE NEGATIVE mg/dL   Hgb urine dipstick NEGATIVE NEGATIVE   Bilirubin Urine NEGATIVE NEGATIVE   Ketones, ur NEGATIVE NEGATIVE mg/dL   Protein, ur NEGATIVE NEGATIVE mg/dL   Nitrite NEGATIVE NEGATIVE   Leukocytes,Ua TRACE (A) NEGATIVE   RBC / HPF 0-5 0 - 5 RBC/hpf   WBC, UA 0-5 0 - 5 WBC/hpf   Bacteria, UA NONE SEEN NONE SEEN   Squamous Epithelial / LPF 0-5 0 - 5   Mucus PRESENT   CBC     Status: Abnormal   Collection Time: 07/31/21 12:37 AM  Result Value Ref Range   WBC 8.1 4.0 - 10.5 K/uL   RBC 3.63 (L) 3.87 - 5.11 MIL/uL   Hemoglobin 8.8 (L) 12.0 - 15.0 g/dL   HCT 26.5 (L) 36.0 - 46.0 %   MCV 73.0 (L) 80.0 - 100.0 fL   MCH 24.2 (L) 26.0 - 34.0 pg   MCHC 33.2 30.0 - 36.0 g/dL   RDW 15.2 11.5 - 15.5 %   Platelets 255 150 - 400 K/uL   nRBC 0.0 0.0 - 0.2 %  Comprehensive metabolic panel     Status: Abnormal   Collection Time: 07/31/21 12:37 AM  Result Value Ref Range   Sodium 135 135 - 145 mmol/L   Potassium 3.8 3.5 - 5.1 mmol/L   Chloride 106 98 - 111 mmol/L   CO2 22 22 - 32 mmol/L   Glucose, Bld 82 70 - 99 mg/dL   BUN <5 (L) 6 - 20 mg/dL   Creatinine, Ser 0.52 0.44 - 1.00 mg/dL   Calcium 8.5 (L) 8.9 - 10.3 mg/dL   Total Protein 6.1 (L) 6.5 - 8.1 g/dL   Albumin 3.1 (L) 3.5 - 5.0 g/dL   AST 15 15 - 41 U/L   ALT 8 0 - 44 U/L   Alkaline Phosphatase 46 38 - 126 U/L   Total Bilirubin 0.5 0.3 - 1.2 mg/dL   GFR, Estimated >60 >60 mL/min  Anion gap 7 5 - 15  Lipase, blood     Status: None   Collection Time: 07/31/21 12:37 AM  Result Value Ref Range   Lipase 35 11 - 51 U/L    Imaging No results found.  MAU Course  Procedures Lab Orders         CBC         Comprehensive metabolic panel         Lipase, blood         Urinalysis, Routine w reflex microscopic Urine,  Clean Catch     Meds ordered this encounter  Medications   lactated ringers infusion   ondansetron (ZOFRAN) injection 4 mg   DISCONTD: HYDROmorphone (DILAUDID) injection 1 mg   promethazine (PHENERGAN) tablet 25 mg   HYDROmorphone (DILAUDID) injection 0.5 mg   promethazine (PHENERGAN) 25 MG tablet    Sig: Take 1 tablet (25 mg total) by mouth every 6 (six) hours as needed for nausea or vomiting.    Dispense:  30 tablet    Refill:  0    Order Specific Question:   Supervising Provider    Answer:   Rip Harbour, MICHAEL L [[id number]]   oxyCODONE (ROXICODONE) 5 MG immediate release tablet    Sig: Take 1 tablet (5 mg total) by mouth every 6 (six) hours as needed for up to 5 days for severe pain.    Dispense:  20 tablet    Refill:  0    Order Specific Question:   Supervising Provider    Answer:   Arlina Robes L [[id number]]   Imaging Orders  No imaging studies ordered today    MDM Reviewed epic notes from recent admission  Labs ordered & reviewed - improved from admission  Pain improved & patient appears more comfortable after IV fluids & meds. Will d/c home with rx meds.  Assessment and Plan   1. Cholelithiasis affecting pregnancy in second trimester, antepartum   2. [redacted] weeks gestation of pregnancy    -Given instructions regarding gallbladder diet -Reviewed reasons to return to MAU including persistent vomiting, uncontrolled pain, or fever -Rx oxycodone & phenergan  Jorje Guild, NP 07/31/21 2:36 AM

## 2021-08-02 ENCOUNTER — Ambulatory Visit: Payer: Medicaid Other | Admitting: Clinical

## 2021-08-02 DIAGNOSIS — Z91199 Patient's noncompliance with other medical treatment and regimen due to unspecified reason: Secondary | ICD-10-CM

## 2021-08-03 ENCOUNTER — Encounter: Payer: Self-pay | Admitting: Neurology

## 2021-08-03 ENCOUNTER — Telehealth (INDEPENDENT_AMBULATORY_CARE_PROVIDER_SITE_OTHER): Payer: Medicaid Other | Admitting: Neurology

## 2021-08-03 VITALS — Ht 65.0 in | Wt 138.0 lb

## 2021-08-03 DIAGNOSIS — Z91199 Patient's noncompliance with other medical treatment and regimen due to unspecified reason: Secondary | ICD-10-CM

## 2021-08-03 NOTE — Progress Notes (Signed)
Patient sent text link twice and called twice, left voicemail twice. On second call attempt at 10:42am, patient instructed to call back to reschedule appointment.

## 2021-08-05 ENCOUNTER — Other Ambulatory Visit (HOSPITAL_BASED_OUTPATIENT_CLINIC_OR_DEPARTMENT_OTHER): Payer: Self-pay | Admitting: Obstetrics & Gynecology

## 2021-08-10 ENCOUNTER — Encounter (HOSPITAL_BASED_OUTPATIENT_CLINIC_OR_DEPARTMENT_OTHER): Payer: Self-pay | Admitting: Obstetrics & Gynecology

## 2021-08-12 ENCOUNTER — Ambulatory Visit (INDEPENDENT_AMBULATORY_CARE_PROVIDER_SITE_OTHER): Payer: Medicaid Other | Admitting: Obstetrics & Gynecology

## 2021-08-12 VITALS — BP 98/70 | HR 89 | Wt 150.6 lb

## 2021-08-12 DIAGNOSIS — Z3482 Encounter for supervision of other normal pregnancy, second trimester: Secondary | ICD-10-CM

## 2021-08-12 DIAGNOSIS — O0990 Supervision of high risk pregnancy, unspecified, unspecified trimester: Secondary | ICD-10-CM

## 2021-08-12 DIAGNOSIS — G40909 Epilepsy, unspecified, not intractable, without status epilepticus: Secondary | ICD-10-CM

## 2021-08-12 DIAGNOSIS — Z2839 Other underimmunization status: Secondary | ICD-10-CM

## 2021-08-12 DIAGNOSIS — O0992 Supervision of high risk pregnancy, unspecified, second trimester: Secondary | ICD-10-CM

## 2021-08-12 DIAGNOSIS — Z8619 Personal history of other infectious and parasitic diseases: Secondary | ICD-10-CM

## 2021-08-12 DIAGNOSIS — Z3A27 27 weeks gestation of pregnancy: Secondary | ICD-10-CM

## 2021-08-12 DIAGNOSIS — O99012 Anemia complicating pregnancy, second trimester: Secondary | ICD-10-CM

## 2021-08-12 DIAGNOSIS — O09899 Supervision of other high risk pregnancies, unspecified trimester: Secondary | ICD-10-CM

## 2021-08-12 DIAGNOSIS — R87612 Low grade squamous intraepithelial lesion on cytologic smear of cervix (LGSIL): Secondary | ICD-10-CM

## 2021-08-12 DIAGNOSIS — D563 Thalassemia minor: Secondary | ICD-10-CM
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
## 2021-08-13 LAB — CBC
Hematocrit: 28.4 % — ABNORMAL LOW (ref 34.0–46.6)
Hemoglobin: 9.1 g/dL — ABNORMAL LOW (ref 11.1–15.9)
MCH: 23.5 pg — ABNORMAL LOW (ref 26.6–33.0)
MCHC: 32 g/dL (ref 31.5–35.7)
MCV: 73 fL — ABNORMAL LOW (ref 79–97)
Platelets: 297 10*3/uL (ref 150–450)
RBC: 3.87 x10E6/uL (ref 3.77–5.28)
RDW: 15.5 % — ABNORMAL HIGH (ref 11.7–15.4)
WBC: 11.6 10*3/uL — ABNORMAL HIGH (ref 3.4–10.8)

## 2021-08-13 LAB — HIV ANTIBODY (ROUTINE TESTING W REFLEX): HIV Screen 4th Generation wRfx: NONREACTIVE

## 2021-08-13 LAB — GLUCOSE TOLERANCE, 2 HOURS W/ 1HR
Glucose, 1 hour: 115 mg/dL (ref 70–179)
Glucose, 2 hour: 94 mg/dL (ref 70–152)
Glucose, Fasting: 70 mg/dL (ref 70–91)

## 2021-08-13 LAB — RPR: RPR Ser Ql: NONREACTIVE

## 2021-08-23 ENCOUNTER — Other Ambulatory Visit (HOSPITAL_BASED_OUTPATIENT_CLINIC_OR_DEPARTMENT_OTHER): Payer: Self-pay | Admitting: *Deleted

## 2021-08-23 ENCOUNTER — Other Ambulatory Visit (HOSPITAL_BASED_OUTPATIENT_CLINIC_OR_DEPARTMENT_OTHER): Payer: Self-pay | Admitting: Obstetrics & Gynecology

## 2021-08-23 DIAGNOSIS — G43709 Chronic migraine without aura, not intractable, without status migrainosus: Secondary | ICD-10-CM

## 2021-08-23 DIAGNOSIS — R112 Nausea with vomiting, unspecified: Secondary | ICD-10-CM

## 2021-08-23 MED ORDER — ONDANSETRON HCL 4 MG PO TABS: 4.0000 mg | ORAL_TABLET | Freq: Three times a day (TID) | ORAL | 0 refills | Status: DC | PRN

## 2021-08-23 MED ORDER — METOCLOPRAMIDE HCL 10 MG PO TABS: ORAL_TABLET | ORAL | 0 refills | Status: DC

## 2021-08-26 ENCOUNTER — Other Ambulatory Visit: Payer: Self-pay

## 2021-08-26 ENCOUNTER — Inpatient Hospital Stay (HOSPITAL_COMMUNITY)
Admission: AD | Admit: 2021-08-26 | Discharge: 2021-08-26 | Disposition: A | Discharge status: Home / Self Care | Payer: Medicaid Other | Attending: Obstetrics & Gynecology | Admitting: Obstetrics & Gynecology

## 2021-08-26 ENCOUNTER — Encounter (HOSPITAL_COMMUNITY): Payer: Self-pay | Admitting: Obstetrics & Gynecology

## 2021-08-26 DIAGNOSIS — B9689 Other specified bacterial agents as the cause of diseases classified elsewhere: Secondary | ICD-10-CM | POA: Insufficient documentation

## 2021-08-26 DIAGNOSIS — O0990 Supervision of high risk pregnancy, unspecified, unspecified trimester: Secondary | ICD-10-CM

## 2021-08-26 DIAGNOSIS — O4703 False labor before 37 completed weeks of gestation, third trimester: Secondary | ICD-10-CM | POA: Diagnosis not present

## 2021-08-26 DIAGNOSIS — Z3A28 28 weeks gestation of pregnancy: Secondary | ICD-10-CM | POA: Diagnosis not present

## 2021-08-26 DIAGNOSIS — O26899 Other specified pregnancy related conditions, unspecified trimester: Secondary | ICD-10-CM

## 2021-08-26 DIAGNOSIS — Z3A29 29 weeks gestation of pregnancy: Secondary | ICD-10-CM | POA: Insufficient documentation

## 2021-08-26 DIAGNOSIS — O99891 Other specified diseases and conditions complicating pregnancy: Secondary | ICD-10-CM

## 2021-08-26 DIAGNOSIS — D563 Thalassemia minor: Secondary | ICD-10-CM

## 2021-08-26 DIAGNOSIS — R109 Unspecified abdominal pain: Secondary | ICD-10-CM

## 2021-08-26 DIAGNOSIS — M549 Dorsalgia, unspecified: Secondary | ICD-10-CM

## 2021-08-26 DIAGNOSIS — M545 Low back pain, unspecified: Secondary | ICD-10-CM | POA: Diagnosis not present

## 2021-08-26 DIAGNOSIS — O26893 Other specified pregnancy related conditions, third trimester: Secondary | ICD-10-CM | POA: Diagnosis not present

## 2021-08-26 DIAGNOSIS — O23593 Infection of other part of genital tract in pregnancy, third trimester: Secondary | ICD-10-CM | POA: Insufficient documentation

## 2021-08-26 DIAGNOSIS — R82998 Other abnormal findings in urine: Secondary | ICD-10-CM

## 2021-08-26 LAB — URINALYSIS, ROUTINE W REFLEX MICROSCOPIC
Bilirubin Urine: NEGATIVE
Glucose, UA: NEGATIVE mg/dL
Hgb urine dipstick: NEGATIVE
Ketones, ur: NEGATIVE mg/dL
Nitrite: NEGATIVE
Protein, ur: NEGATIVE mg/dL
Specific Gravity, Urine: 1.013 (ref 1.005–1.030)
pH: 7 (ref 5.0–8.0)

## 2021-08-26 LAB — WET PREP, GENITAL
Sperm: NONE SEEN
Trich, Wet Prep: NONE SEEN
WBC, Wet Prep HPF POC: <10 (ref <10)
Yeast Wet Prep HPF POC: NONE SEEN

## 2021-08-26 MED ORDER — CYCLOBENZAPRINE HCL 5 MG PO TABS
5.0000 mg | ORAL_TABLET | Freq: Once | ORAL | Status: AC
  Administered 2021-08-26: 5 mg via ORAL
  Filled 2021-08-26: qty 1

## 2021-08-26 MED ORDER — METRONIDAZOLE 500 MG PO TABS: 500.0000 mg | ORAL_TABLET | Freq: Two times a day (BID) | ORAL | 0 refills | Status: AC

## 2021-08-26 MED ORDER — CYCLOBENZAPRINE HCL 5 MG PO TABS
5.0000 mg | ORAL_TABLET | Freq: Three times a day (TID) | ORAL | 0 refills | Status: DC | PRN
Start: 2021-08-26 — End: 2021-10-21

## 2021-08-26 NOTE — Progress Notes (Signed)
Written and verbal d/c instructions given and understanding voiced. 

## 2021-08-26 NOTE — Progress Notes (Signed)
Baby is very active which pt states is his normal time to be very active

## 2021-08-26 NOTE — MAU Provider Note (Addendum)
Chief Complaint:  Vaginal Discharge and Abdominal Pain   Event Date/Time   First Provider Initiated Contact with Patient 08/26/21 2149     HPI: Alexis [name] is a 26 y.o. G3P2002 at 48w6dwho presents to maternity admissions reporting intermittent contractions and loss of mucous plug.  Has some pain in lower back also.. She reports good fetal movement, denies LOF, vaginal bleeding, vaginal itching/burning, urinary symptoms, h/a, dizziness, n/v, diarrhea, constipation or fever/chills.    Vaginal Discharge The patient's primary symptoms include pelvic pain (intermittent cramping) and vaginal discharge. The patient's pertinent negatives include no genital itching, genital lesions, genital odor or vaginal bleeding. This is a new problem. The current episode started today. The problem has been unchanged. The pain is moderate. She is pregnant. Associated symptoms include abdominal pain and back pain. Pertinent negatives include no chills, constipation, diarrhea, dysuria, fever or frequency. The vaginal discharge was mucoid. There has been no bleeding. She has not been passing clots. She has not been passing tissue. Nothing aggravates the symptoms. She has tried nothing for the symptoms.  Abdominal Pain This is a new problem. The current episode started today. The onset quality is gradual. The problem occurs intermittently. The problem has been unchanged. The pain is located in the generalized abdominal region. The quality of the pain is cramping. The abdominal pain radiates to the back. Pertinent negatives include no constipation, diarrhea, dysuria, fever or frequency. Nothing aggravates the pain. The pain is relieved by Nothing. She has tried nothing for the symptoms.   RN Note: Alexis [name] is a 26 y.o. at [redacted]w[redacted]d here in MAU reporting contractions since this am. Thinks lost some of mucous plug an hour ago. Denies VB or LOF. Baby is moving well  Onset of complaint: this am Pain score: 8  Past  Medical History: Past Medical History:  Diagnosis Date   Beta thalassemia trait    Family history of breast cancer in mother    Gonorrhea 12/2020   treated   Marijuana abuse    Seizures (HCC)    last seizure in 2021   Shingles outbreak 10/2018   Trichomonas infection 02/2021    Past obstetric history: OB History  Gravida Para Term Preterm AB Living  3 2 2     2   SAB IAB Ectopic Multiple Live Births        0 1    # Outcome Date GA Lbr Len/2nd Weight Sex Delivery Anes PTL Lv  3 Current           2 Term 02/08/19 [redacted]w[redacted]d 16:10 / 01:46 2705 g F Vag-Spont EPI  LIV  1 Term 10/27/09 [redacted]w[redacted]d   M Vag-Spont       Past Surgical History: Past Surgical History:  Procedure Laterality Date   NO PAST SURGERIES      Family History: Family History  Problem Relation Age of Onset   Cancer Mother 53       breast   Heart disease Father    Heart disease Paternal Grandmother    Heart disease Paternal Grandfather     Social History: Social History   Tobacco Use   Smoking status: Every Day    Packs/day: 0.25    Types: Cigarettes    Passive exposure: Never   Smokeless tobacco: Never   Tobacco comments:    5=6 cigs a day  Vaping Use   Vaping Use: Never used  Substance Use Topics   Alcohol use: No   Drug use: Not Currently  Allergies: No Known Allergies  Meds:  Medications Prior to Admission  Medication Sig Dispense Refill Last Dose   acetaminophen (TYLENOL) 325 MG tablet Take 500 mg by mouth every 6 (six) hours as needed.   08/26/2021 at 1200   ondansetron (ZOFRAN) 4 MG tablet Take 1 tablet (4 mg total) by mouth every 8 (eight) hours as needed for nausea or vomiting. 30 tablet 0 08/26/2021   Prenatal Vit-Fe Fumarate-FA (PRENATAL PLUS VITAMIN/MINERAL) 27-1 MG TABS Take 1 tablet by mouth daily. 30 tablet 11 08/26/2021   cetirizine (ZYRTEC ALLERGY) 10 MG tablet Take 1 tablet (10 mg total) by mouth daily. (Patient not taking: Reported on 08/03/2021) 30 tablet 0    ferrous sulfate 325 (65  FE) MG EC tablet Take 1 tablet (325 mg total) by mouth every other day. (Patient not taking: Reported on 08/03/2021) 30 tablet 2    fluticasone (FLONASE) 50 MCG/ACT nasal spray Place 2 sprays into both nostrils daily. (Patient not taking: Reported on 08/03/2021) 16 g 12    metoCLOPramide (REGLAN) 10 MG tablet Take 1 tablet with onset of headache 20 tablet 0    promethazine (PHENERGAN) 25 MG tablet Take 1 tablet (25 mg total) by mouth every 6 (six) hours as needed for nausea or vomiting. (Patient not taking: Reported on 08/12/2021) 30 tablet 0     I have reviewed patient's Past Medical Hx, Surgical Hx, Family Hx, Social Hx, medications and allergies.   ROS:  Review of Systems  Constitutional:  Negative for chills and fever.  Gastrointestinal:  Positive for abdominal pain. Negative for constipation and diarrhea.  Genitourinary:  Positive for pelvic pain (intermittent cramping) and vaginal discharge. Negative for dysuria and frequency.  Musculoskeletal:  Positive for back pain.   Other systems negative  Physical Exam  Patient Vitals for the past 24 hrs:  BP Temp Pulse Resp SpO2 Height Weight  08/26/21 2119 111/61 98.4 F (36.9 C) (!) 101 17 100 % 5\' 5"  (1.651 m) 72.1 kg   Constitutional: Well-developed, well-nourished female in no acute distress.  Cardiovascular: normal rate and rhythm Respiratory: normal effort, clear to auscultation bilaterally GI: Abd soft, non-tender, gravid appropriate for gestational age.   No rebound or guarding. MS: Extremities nontender, no edema, normal ROM Neurologic: Alert and oriented x 4.  GU: Neg CVAT.  PELVIC EXAM:   Dilation: 1 Effacement (%): Thick Cervical Position: Posterior Station: Ballotable Presentation: Undeterminable Exam by:: [email or web address] CNM   FHT:  Baseline 130 , moderate variability, accelerations present, no decelerations Contractions: Rare with intermittent irritability   Labs: Results for orders placed or performed during the  hospital encounter of 08/26/21 (from the past 24 hour(s))  Urinalysis, Routine w reflex microscopic Urine, Clean Catch     Status: Abnormal   Collection Time: 08/26/21  9:34 PM  Result Value Ref Range   Color, Urine YELLOW YELLOW   APPearance HAZY (A) CLEAR   Specific Gravity, Urine 1.013 1.005 - 1.030   pH 7.0 5.0 - 8.0   Glucose, UA NEGATIVE NEGATIVE mg/dL   Hgb urine dipstick NEGATIVE NEGATIVE   Bilirubin Urine NEGATIVE NEGATIVE   Ketones, ur NEGATIVE NEGATIVE mg/dL   Protein, ur NEGATIVE NEGATIVE mg/dL   Nitrite NEGATIVE NEGATIVE   Leukocytes,Ua MODERATE (A) NEGATIVE   RBC / HPF 0-5 0 - 5 RBC/hpf   WBC, UA 0-5 0 - 5 WBC/hpf   Bacteria, UA RARE (A) NONE SEEN   Squamous Epithelial / LPF 11-20 0 - 5   Mucus PRESENT  Amorphous Crystal PRESENT   Wet prep, genital     Status: Abnormal   Collection Time: 08/26/21  9:42 PM   Specimen: Vaginal  Result Value Ref Range   Yeast Wet Prep HPF POC NONE SEEN NONE SEEN   Trich, Wet Prep NONE SEEN NONE SEEN   Clue Cells Wet Prep HPF POC PRESENT (A) NONE SEEN   WBC, Wet Prep HPF POC <10 <10   Sperm NONE SEEN     --/--/O POS (06/08 3888)  Imaging:   MAU Course/MDM: I have reviewed the triage vital signs and the nursing notes.   Pertinent labs & imaging results that were available during my care of the patient were reviewed by me and considered in my medical decision making (see chart for details).      I have reviewed her medical records including past results, notes and treatments.   I have ordered labs and reviewed results. Urine has leukocytes, sent for culture.  Wet prep showed clue cells, so given the mucous discharge will treat for BV.  NST reviewed, reassuring for gestational age.  Very occasional contractions seen.  Even though Cervix is dilated 1cm, it is very long and firm, so doubt this is related to contractions, suspect multiparous.   Treatments in MAU included Flexeril given which did improve back pain.  Discussed how to  take it and side effect  of sedaton,warned against driving while taking it.    Assessment: Single IUP at [redacted]w[redacted]d Vaginal discharge Low back pain  Occasional contractions Bacterial Vaginosis  Plan: Discharge home Rx Flexeril for back pain PRN Rx Flagyl for BV Preterm Labor precautions and fetal kick counts Follow up in Office for prenatal visits and recheck Encouraged to return if she develops worsening of symptoms, increase in pain, fever, or other concerning symptoms.   Pt stable at time of discharge.  Wynelle Bourgeois CNM, MSN Certified Nurse-Midwife 08/26/2021 9:49 PM

## 2021-08-26 NOTE — MAU Note (Signed)
.  Alexis [name] is a 26 y.o. at [redacted]w[redacted]d here in MAU reporting contractions since this am. Thinks lost some of mucous plug an hour ago. Denies VB or LOF. Baby is moving well  Onset of complaint: this am Pain score: 8 Vitals:   08/26/21 2119  BP: 111/61  Pulse: (!) 101  Resp: 17  Temp: 98.4 F (36.9 C)  SpO2: 100%     FHT:[id number] Lab orders placed from triage:  u/a

## 2021-08-27 ENCOUNTER — Ambulatory Visit: Payer: Medicaid Other | Attending: Obstetrics and Gynecology

## 2021-08-27 ENCOUNTER — Other Ambulatory Visit: Payer: Self-pay | Admitting: Obstetrics and Gynecology

## 2021-08-27 ENCOUNTER — Encounter (HOSPITAL_BASED_OUTPATIENT_CLINIC_OR_DEPARTMENT_OTHER): Payer: Self-pay | Admitting: *Deleted

## 2021-08-27 ENCOUNTER — Telehealth: Payer: Self-pay

## 2021-08-27 ENCOUNTER — Other Ambulatory Visit: Payer: Self-pay | Admitting: *Deleted

## 2021-08-27 ENCOUNTER — Ambulatory Visit: Payer: Medicaid Other | Admitting: *Deleted

## 2021-08-27 VITALS — BP 111/62

## 2021-08-27 DIAGNOSIS — O99332 Smoking (tobacco) complicating pregnancy, second trimester: Secondary | ICD-10-CM

## 2021-08-27 DIAGNOSIS — F17210 Nicotine dependence, cigarettes, uncomplicated: Secondary | ICD-10-CM

## 2021-08-27 DIAGNOSIS — D563 Thalassemia minor: Secondary | ICD-10-CM

## 2021-08-27 DIAGNOSIS — O99013 Anemia complicating pregnancy, third trimester: Secondary | ICD-10-CM

## 2021-08-27 DIAGNOSIS — O0990 Supervision of high risk pregnancy, unspecified, unspecified trimester: Secondary | ICD-10-CM | POA: Insufficient documentation

## 2021-08-27 DIAGNOSIS — G40909 Epilepsy, unspecified, not intractable, without status epilepticus: Secondary | ICD-10-CM

## 2021-08-27 DIAGNOSIS — O365930 Maternal care for other known or suspected poor fetal growth, third trimester, not applicable or unspecified: Secondary | ICD-10-CM | POA: Diagnosis not present

## 2021-08-27 DIAGNOSIS — O99333 Smoking (tobacco) complicating pregnancy, third trimester: Secondary | ICD-10-CM

## 2021-08-27 DIAGNOSIS — Z3A29 29 weeks gestation of pregnancy: Secondary | ICD-10-CM

## 2021-08-27 DIAGNOSIS — T1490XD Injury, unspecified, subsequent encounter: Secondary | ICD-10-CM

## 2021-08-27 DIAGNOSIS — O9A213 Injury, poisoning and certain other consequences of external causes complicating pregnancy, third trimester: Secondary | ICD-10-CM

## 2021-08-27 NOTE — Progress Notes (Signed)
Chart review since patient called after hours last night. Pt did call [facility] Number for Ob nurse line. Pt was seen in MAU and has an apt at [facility] today. KW CMA Next apt with our office 09/06/21. KW CMA

## 2021-08-31 ENCOUNTER — Other Ambulatory Visit: Payer: Self-pay | Admitting: *Deleted

## 2021-08-31 DIAGNOSIS — G40909 Epilepsy, unspecified, not intractable, without status epilepticus: Secondary | ICD-10-CM

## 2021-08-31 DIAGNOSIS — O99333 Smoking (tobacco) complicating pregnancy, third trimester: Secondary | ICD-10-CM

## 2021-09-01 ENCOUNTER — Other Ambulatory Visit: Payer: Self-pay | Admitting: *Deleted

## 2021-09-01 DIAGNOSIS — G40909 Epilepsy, unspecified, not intractable, without status epilepticus: Secondary | ICD-10-CM

## 2021-09-01 DIAGNOSIS — F17200 Nicotine dependence, unspecified, uncomplicated: Secondary | ICD-10-CM

## 2021-09-04 ENCOUNTER — Encounter (HOSPITAL_COMMUNITY): Payer: Self-pay | Admitting: Obstetrics and Gynecology

## 2021-09-04 ENCOUNTER — Other Ambulatory Visit: Payer: Self-pay

## 2021-09-04 ENCOUNTER — Inpatient Hospital Stay (HOSPITAL_COMMUNITY)
Admission: AD | Admit: 2021-09-04 | Discharge: 2021-09-04 | Disposition: A | Discharge status: Home / Self Care | Payer: Medicaid Other | Attending: Obstetrics and Gynecology | Admitting: Obstetrics and Gynecology

## 2021-09-04 DIAGNOSIS — O26893 Other specified pregnancy related conditions, third trimester: Secondary | ICD-10-CM | POA: Diagnosis not present

## 2021-09-04 DIAGNOSIS — O99013 Anemia complicating pregnancy, third trimester: Secondary | ICD-10-CM | POA: Diagnosis not present

## 2021-09-04 DIAGNOSIS — D561 Beta thalassemia: Secondary | ICD-10-CM | POA: Diagnosis not present

## 2021-09-04 DIAGNOSIS — O368130 Decreased fetal movements, third trimester, not applicable or unspecified: Secondary | ICD-10-CM | POA: Diagnosis not present

## 2021-09-04 DIAGNOSIS — O0993 Supervision of high risk pregnancy, unspecified, third trimester: Secondary | ICD-10-CM | POA: Diagnosis present

## 2021-09-04 DIAGNOSIS — O99353 Diseases of the nervous system complicating pregnancy, third trimester: Secondary | ICD-10-CM | POA: Insufficient documentation

## 2021-09-04 DIAGNOSIS — Z3A30 30 weeks gestation of pregnancy: Secondary | ICD-10-CM | POA: Diagnosis not present

## 2021-09-04 DIAGNOSIS — R55 Syncope and collapse: Secondary | ICD-10-CM | POA: Diagnosis not present

## 2021-09-04 DIAGNOSIS — O0990 Supervision of high risk pregnancy, unspecified, unspecified trimester: Secondary | ICD-10-CM

## 2021-09-04 DIAGNOSIS — G40909 Epilepsy, unspecified, not intractable, without status epilepticus: Secondary | ICD-10-CM | POA: Diagnosis not present

## 2021-09-04 DIAGNOSIS — O98513 Other viral diseases complicating pregnancy, third trimester: Secondary | ICD-10-CM | POA: Insufficient documentation

## 2021-09-04 DIAGNOSIS — D563 Thalassemia minor: Secondary | ICD-10-CM

## 2021-09-04 LAB — COMPREHENSIVE METABOLIC PANEL
ALT: 9 U/L (ref 0–44)
AST: 15 U/L (ref 15–41)
Albumin: 2.8 g/dL — ABNORMAL LOW (ref 3.5–5.0)
Alkaline Phosphatase: 59 U/L (ref 38–126)
Anion gap: 12 (ref 5–15)
BUN: <5 mg/dL — ABNORMAL LOW (ref 6–20)
CO2: 19 mmol/L — ABNORMAL LOW (ref 22–32)
Calcium: 8.5 mg/dL — ABNORMAL LOW (ref 8.9–10.3)
Chloride: 103 mmol/L (ref 98–111)
Creatinine, Ser: 0.42 mg/dL — ABNORMAL LOW (ref 0.44–1.00)
GFR, Estimated: >60 mL/min (ref >60)
Glucose, Bld: 76 mg/dL (ref 70–99)
Potassium: 3.5 mmol/L (ref 3.5–5.1)
Sodium: 134 mmol/L — ABNORMAL LOW (ref 135–145)
Total Bilirubin: 0.6 mg/dL (ref 0.3–1.2)
Total Protein: 5.9 g/dL — ABNORMAL LOW (ref 6.5–8.1)

## 2021-09-04 LAB — CBC WITH DIFFERENTIAL/PLATELET
Abs Immature Granulocytes: 0.08 10*3/uL — ABNORMAL HIGH (ref 0.00–0.07)
Basophils Absolute: 0 10*3/uL (ref 0.0–0.1)
Basophils Relative: 0 %
Eosinophils Absolute: 0.1 10*3/uL (ref 0.0–0.5)
Eosinophils Relative: 1 %
HCT: 24.6 % — ABNORMAL LOW (ref 36.0–46.0)
Hemoglobin: 8.4 g/dL — ABNORMAL LOW (ref 12.0–15.0)
Immature Granulocytes: 1 %
Lymphocytes Relative: 12 %
Lymphs Abs: 1.4 10*3/uL (ref 0.7–4.0)
MCH: 23.9 pg — ABNORMAL LOW (ref 26.0–34.0)
MCHC: 34.1 g/dL (ref 30.0–36.0)
MCV: 69.9 fL — ABNORMAL LOW (ref 80.0–100.0)
Monocytes Absolute: 0.4 10*3/uL (ref 0.1–1.0)
Monocytes Relative: 4 %
Neutro Abs: 9.2 10*3/uL — ABNORMAL HIGH (ref 1.7–7.7)
Neutrophils Relative %: 82 %
Platelets: 267 10*3/uL (ref 150–400)
RBC: 3.52 MIL/uL — ABNORMAL LOW (ref 3.87–5.11)
RDW: 15.2 % (ref 11.5–15.5)
WBC: 11.2 10*3/uL — ABNORMAL HIGH (ref 4.0–10.5)
nRBC: 0 % (ref 0.0–0.2)

## 2021-09-04 LAB — URINALYSIS, ROUTINE W REFLEX MICROSCOPIC
Bilirubin Urine: NEGATIVE
Glucose, UA: NEGATIVE mg/dL
Hgb urine dipstick: NEGATIVE
Ketones, ur: NEGATIVE mg/dL
Nitrite: NEGATIVE
Protein, ur: NEGATIVE mg/dL
Specific Gravity, Urine: 1.013 (ref 1.005–1.030)
pH: 6 (ref 5.0–8.0)

## 2021-09-04 LAB — WET PREP, GENITAL
Sperm: NONE SEEN
Trich, Wet Prep: NONE SEEN
WBC, Wet Prep HPF POC: >=10 — AB (ref <10)
Yeast Wet Prep HPF POC: NONE SEEN

## 2021-09-04 LAB — GLUCOSE, CAPILLARY: Glucose-Capillary: 64 mg/dL — ABNORMAL LOW (ref 70–99)

## 2021-09-04 MED ORDER — ACETAMINOPHEN 500 MG PO TABS
1000.0000 mg | ORAL_TABLET | Freq: Once | ORAL | Status: AC
  Administered 2021-09-04: 1000 mg via ORAL
  Filled 2021-09-04: qty 2

## 2021-09-04 NOTE — MAU Provider Note (Signed)
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   cyclobenzaprine 5 MG tablet Commonly known as: FLEXERIL Take 1 tablet (5 mg total) by mouth 3 (three) times daily as needed for muscle spasms.   ferrous sulfate 325 (65 FE) MG EC tablet Take 1 tablet (325 mg total) by mouth every other day.   metoCLOPramide 10 MG tablet Commonly known as: REGLAN Take 1 tablet with onset of headache   ondansetron 4 MG tablet Commonly known as: Zofran Take  1 tablet (4 mg total) by mouth every 8 (eight) hours as needed for nausea or vomiting.   Prenatal Plus Vitamin/Mineral 27-1 MG Tabs Take 1 tablet by mouth daily.       -Dr. Hyacinth Meeker has patient on oral iron and has discussed IV iron with patient, messaged Dr. Hyacinth Meeker to let her know about Hgb found in MAU, pt with visit on Monday 7/17 -return MAU precautions  given -pt discharged to home in stable condition  Joni Reining E Kassie Keng 09/04/2021, 7:39 PM
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
## 2021-09-04 NOTE — Discharge Instructions (Signed)

## 2021-09-06 ENCOUNTER — Encounter (HOSPITAL_BASED_OUTPATIENT_CLINIC_OR_DEPARTMENT_OTHER): Payer: Self-pay

## 2021-09-06 ENCOUNTER — Telehealth (HOSPITAL_BASED_OUTPATIENT_CLINIC_OR_DEPARTMENT_OTHER): Payer: Self-pay | Admitting: Obstetrics & Gynecology

## 2021-09-06 ENCOUNTER — Encounter (HOSPITAL_BASED_OUTPATIENT_CLINIC_OR_DEPARTMENT_OTHER): Payer: Medicaid Other | Admitting: Obstetrics & Gynecology

## 2021-09-06 LAB — CULTURE, OB URINE

## 2021-09-06 LAB — GC/CHLAMYDIA PROBE AMP (~~LOC~~) NOT AT ARMC
Chlamydia: NEGATIVE
Comment: NEGATIVE
Comment: NORMAL
Neisseria Gonorrhea: NEGATIVE

## 2021-09-06 NOTE — Telephone Encounter (Signed)
Called patient and left a message to please call back about today no show appointment.

## 2021-09-08 ENCOUNTER — Other Ambulatory Visit: Payer: Self-pay | Admitting: Maternal & Fetal Medicine

## 2021-09-08 ENCOUNTER — Ambulatory Visit: Payer: Medicaid Other | Admitting: *Deleted

## 2021-09-08 ENCOUNTER — Other Ambulatory Visit: Payer: Self-pay | Admitting: *Deleted

## 2021-09-08 ENCOUNTER — Ambulatory Visit: Payer: Medicaid Other | Attending: Obstetrics

## 2021-09-08 VITALS — BP 108/60 | HR 83

## 2021-09-08 DIAGNOSIS — O99333 Smoking (tobacco) complicating pregnancy, third trimester: Secondary | ICD-10-CM | POA: Diagnosis present

## 2021-09-08 DIAGNOSIS — G40909 Epilepsy, unspecified, not intractable, without status epilepticus: Secondary | ICD-10-CM | POA: Insufficient documentation

## 2021-09-08 DIAGNOSIS — O99353 Diseases of the nervous system complicating pregnancy, third trimester: Secondary | ICD-10-CM | POA: Insufficient documentation

## 2021-09-08 DIAGNOSIS — D563 Thalassemia minor: Secondary | ICD-10-CM | POA: Insufficient documentation

## 2021-09-08 DIAGNOSIS — O0990 Supervision of high risk pregnancy, unspecified, unspecified trimester: Secondary | ICD-10-CM | POA: Insufficient documentation

## 2021-09-08 DIAGNOSIS — O99330 Smoking (tobacco) complicating pregnancy, unspecified trimester: Secondary | ICD-10-CM | POA: Diagnosis present

## 2021-09-08 DIAGNOSIS — F17210 Nicotine dependence, cigarettes, uncomplicated: Secondary | ICD-10-CM | POA: Diagnosis not present

## 2021-09-08 DIAGNOSIS — Z3A30 30 weeks gestation of pregnancy: Secondary | ICD-10-CM

## 2021-09-08 DIAGNOSIS — F17200 Nicotine dependence, unspecified, uncomplicated: Secondary | ICD-10-CM | POA: Diagnosis present

## 2021-09-08 DIAGNOSIS — O365931 Maternal care for other known or suspected poor fetal growth, third trimester, fetus 1: Secondary | ICD-10-CM

## 2021-09-08 DIAGNOSIS — O365930 Maternal care for other known or suspected poor fetal growth, third trimester, not applicable or unspecified: Secondary | ICD-10-CM

## 2021-09-08 NOTE — Procedures (Signed)
Alexis [name] July 31, 1995 [redacted]w[redacted]d  Fetus A Non-Stress Test Interpretation for 09/08/21  Indication: Unsatisfactory BPP  Fetal Heart Rate A Mode: External Baseline Rate (A): 145 bpm Variability: Moderate Accelerations: 15 x 15 Decelerations: None, Variable Multiple birth?: No  Uterine Activity Mode: Toco Contraction Frequency (min): none Resting Tone Palpated: Relaxed  Interpretation (Fetal Testing) Nonstress Test Interpretation: Reactive Overall Impression: Reassuring for gestational age Comments: tracing reviewed by Dr. Judeth Cornfield

## 2021-09-09 ENCOUNTER — Encounter (HOSPITAL_BASED_OUTPATIENT_CLINIC_OR_DEPARTMENT_OTHER): Payer: Medicaid Other | Admitting: Obstetrics & Gynecology

## 2021-09-10 ENCOUNTER — Encounter (HOSPITAL_BASED_OUTPATIENT_CLINIC_OR_DEPARTMENT_OTHER): Payer: Self-pay

## 2021-09-10 ENCOUNTER — Encounter (HOSPITAL_BASED_OUTPATIENT_CLINIC_OR_DEPARTMENT_OTHER): Payer: Medicaid Other | Admitting: Medical

## 2021-09-15 ENCOUNTER — Ambulatory Visit: Payer: Medicaid Other | Attending: Obstetrics and Gynecology

## 2021-09-15 ENCOUNTER — Ambulatory Visit: Payer: Medicaid Other

## 2021-09-17 ENCOUNTER — Other Ambulatory Visit: Payer: Self-pay

## 2021-09-17 ENCOUNTER — Ambulatory Visit: Payer: Medicaid Other

## 2021-09-20 ENCOUNTER — Ambulatory Visit (INDEPENDENT_AMBULATORY_CARE_PROVIDER_SITE_OTHER): Payer: Medicaid Other | Admitting: Obstetrics & Gynecology

## 2021-09-20 ENCOUNTER — Encounter (HOSPITAL_BASED_OUTPATIENT_CLINIC_OR_DEPARTMENT_OTHER): Payer: Self-pay | Admitting: Obstetrics & Gynecology

## 2021-09-20 VITALS — BP 127/80 | HR 115 | Wt 156.6 lb

## 2021-09-20 DIAGNOSIS — O0990 Supervision of high risk pregnancy, unspecified, unspecified trimester: Secondary | ICD-10-CM

## 2021-09-20 DIAGNOSIS — O365930 Maternal care for other known or suspected poor fetal growth, third trimester, not applicable or unspecified: Secondary | ICD-10-CM

## 2021-09-20 DIAGNOSIS — Z3A32 32 weeks gestation of pregnancy: Secondary | ICD-10-CM

## 2021-09-20 DIAGNOSIS — O09899 Supervision of other high risk pregnancies, unspecified trimester: Secondary | ICD-10-CM

## 2021-09-20 DIAGNOSIS — O0993 Supervision of high risk pregnancy, unspecified, third trimester: Secondary | ICD-10-CM

## 2021-09-20 DIAGNOSIS — D563 Thalassemia minor: Secondary | ICD-10-CM

## 2021-09-20 DIAGNOSIS — R87612 Low grade squamous intraepithelial lesion on cytologic smear of cervix (LGSIL): Secondary | ICD-10-CM

## 2021-09-20 DIAGNOSIS — F17200 Nicotine dependence, unspecified, uncomplicated: Secondary | ICD-10-CM

## 2021-09-20 DIAGNOSIS — Z8619 Personal history of other infectious and parasitic diseases: Secondary | ICD-10-CM

## 2021-09-20 DIAGNOSIS — Z2839 Other underimmunization status: Secondary | ICD-10-CM

## 2021-09-20 DIAGNOSIS — G40909 Epilepsy, unspecified, not intractable, without status epilepticus: Secondary | ICD-10-CM

## 2021-09-20 DIAGNOSIS — O99013 Anemia complicating pregnancy, third trimester: Secondary | ICD-10-CM

## 2021-09-21 ENCOUNTER — Telehealth (HOSPITAL_BASED_OUTPATIENT_CLINIC_OR_DEPARTMENT_OTHER): Payer: Self-pay | Admitting: Obstetrics & Gynecology

## 2021-09-21 ENCOUNTER — Telehealth: Payer: Self-pay | Admitting: Pharmacy Technician

## 2021-09-21 ENCOUNTER — Other Ambulatory Visit: Payer: Self-pay | Admitting: Pharmacy Technician

## 2021-09-21 DIAGNOSIS — O365930 Maternal care for other known or suspected poor fetal growth, third trimester, not applicable or unspecified: Secondary | ICD-10-CM | POA: Insufficient documentation

## 2021-09-21 NOTE — Telephone Encounter (Addendum)
Dr. Hyacinth Meeker, [place] note:  Auth Submission: no auth needed Payer: [employer] Medication & CPT/J Code(s) submitted: Venofer (Iron Sucrose) J1756 Route of submission (phone, fax, portal): PHONE Auth type: Buy/Bill Units/visits requested: X4 DOSES Reference number: [id number] Approval from: 09/21/21 to 12/22/21   Patient will be scheduled as soon as possible.
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
## 2021-09-21 NOTE — Telephone Encounter (Signed)
Patient called and would like for the nurse to please call her back about the referral Dr.Miller did for her yesterday.

## 2021-09-22 NOTE — Telephone Encounter (Signed)
LMOVM returning pts call.  

## 2021-09-23 ENCOUNTER — Telehealth: Payer: Medicaid Other | Admitting: Physician Assistant

## 2021-09-23 ENCOUNTER — Ambulatory Visit: Payer: Medicaid Other | Admitting: *Deleted

## 2021-09-23 ENCOUNTER — Ambulatory Visit: Payer: Medicaid Other | Attending: Obstetrics and Gynecology

## 2021-09-23 VITALS — BP 105/54 | HR 89

## 2021-09-23 DIAGNOSIS — O285 Abnormal chromosomal and genetic finding on antenatal screening of mother: Secondary | ICD-10-CM

## 2021-09-23 DIAGNOSIS — D563 Thalassemia minor: Secondary | ICD-10-CM | POA: Insufficient documentation

## 2021-09-23 DIAGNOSIS — T1490XD Injury, unspecified, subsequent encounter: Secondary | ICD-10-CM

## 2021-09-23 DIAGNOSIS — O9A213 Injury, poisoning and certain other consequences of external causes complicating pregnancy, third trimester: Secondary | ICD-10-CM

## 2021-09-23 DIAGNOSIS — O365930 Maternal care for other known or suspected poor fetal growth, third trimester, not applicable or unspecified: Secondary | ICD-10-CM

## 2021-09-23 DIAGNOSIS — O99333 Smoking (tobacco) complicating pregnancy, third trimester: Secondary | ICD-10-CM

## 2021-09-23 DIAGNOSIS — O0990 Supervision of high risk pregnancy, unspecified, unspecified trimester: Secondary | ICD-10-CM

## 2021-09-23 DIAGNOSIS — O365931 Maternal care for other known or suspected poor fetal growth, third trimester, fetus 1: Secondary | ICD-10-CM | POA: Diagnosis present

## 2021-09-23 DIAGNOSIS — H1032 Unspecified acute conjunctivitis, left eye: Secondary | ICD-10-CM | POA: Diagnosis not present

## 2021-09-23 DIAGNOSIS — O99353 Diseases of the nervous system complicating pregnancy, third trimester: Secondary | ICD-10-CM

## 2021-09-23 DIAGNOSIS — G40909 Epilepsy, unspecified, not intractable, without status epilepticus: Secondary | ICD-10-CM

## 2021-09-23 DIAGNOSIS — Z3A32 32 weeks gestation of pregnancy: Secondary | ICD-10-CM

## 2021-09-23 DIAGNOSIS — F17210 Nicotine dependence, cigarettes, uncomplicated: Secondary | ICD-10-CM

## 2021-09-23 MED ORDER — AZITHROMYCIN 1 % OP SOLN: OPHTHALMIC | 0 refills | Status: DC

## 2021-09-23 MED ORDER — TOBRAMYCIN 0.3 % OP SOLN: 2.0000 [drp] | OPHTHALMIC | 0 refills | Status: AC

## 2021-09-23 NOTE — Addendum Note (Signed)
Addended by: Marcelline Mates C on: 09/23/2021 11:11 AM   Modules accepted: Orders

## 2021-09-23 NOTE — Patient Instructions (Signed)
Alexis [name], thank you for joining Piedad Climes, PA-C for today's virtual visit.  While this provider is not your primary care provider (PCP), if your PCP is located in our provider database this encounter information will be shared with them immediately following your visit.  Consent: (Patient) Alexis [name] provided verbal consent for this virtual visit at the beginning of the encounter.  Current Medications:  Current Outpatient Medications:    acetaminophen (TYLENOL) 325 MG tablet, Take 500 mg by mouth every 6 (six) hours as needed., Disp: , Rfl:    cyclobenzaprine (FLEXERIL) 5 MG tablet, Take 1 tablet (5 mg total) by mouth 3 (three) times daily as needed for muscle spasms., Disp: 30 tablet, Rfl: 0   ferrous sulfate 325 (65 FE) MG EC tablet, Take 1 tablet (325 mg total) by mouth every other day., Disp: 30 tablet, Rfl: 2   metoCLOPramide (REGLAN) 10 MG tablet, Take 1 tablet with onset of headache, Disp: 20 tablet, Rfl: 0   ondansetron (ZOFRAN) 4 MG tablet, Take 1 tablet (4 mg total) by mouth every 8 (eight) hours as needed for nausea or vomiting., Disp: 30 tablet, Rfl: 0   Prenatal Vit-Fe Fumarate-FA (PRENATAL PLUS VITAMIN/MINERAL) 27-1 MG TABS, Take 1 tablet by mouth daily., Disp: 30 tablet, Rfl: 11   Medications ordered in this encounter:  No orders of the defined types were placed in this encounter.    *If you need refills on other medications prior to your next appointment, please contact your pharmacy*  Follow-Up: Call back or seek an in-person evaluation if the symptoms worsen or if the condition fails to improve as anticipated.  Other Instructions Please keep hands clean. Avoid rubbing the affected eye.  Apply eye drops as directed. If symptoms are not resolving, or if you note any new/worsening symptoms despite treatment, please seek an in-person evaluation.  Bacterial Conjunctivitis, Adult Bacterial conjunctivitis is an infection of your conjunctiva. This  is the clear membrane that covers the white part of your eye and the inner part of your eyelid. This infection can make your eye: Red or pink. Itchy or irritated. This condition spreads easily from person to person (is contagious) and from one eye to the other eye. What are the causes? This condition is caused by germs (bacteria). You may get the infection if you come into close contact with: A person who has the infection. Items that have germs on them (are contaminated), such as face towels, contact lens solution, or eye makeup. What increases the risk? You are more likely to get this condition if: You have contact with people who have the infection. You wear contact lenses. You have a sinus infection. You have had a recent eye injury or surgery. You have a weak body defense system (immune system). You have dry eyes. What are the signs or symptoms?  Thick, yellowish discharge from the eye. Tearing or watery eyes. Itchy eyes. Burning feeling in your eyes. Eye redness. Swollen eyelids. Blurred vision. How is this treated?  Antibiotic eye drops or ointment. Antibiotic medicine taken by mouth. This is used for infections that do not get better with drops or ointment or that last more than 10 days. Cool, wet cloths placed on the eyes. Artificial tears used 2-6 times a day. Follow these instructions at home: Medicines Take or apply your antibiotic medicine as told by your doctor. Do not stop using it even if you start to feel better. Take or apply over-the-counter and prescription medicines only as  told by your doctor. Do not touch your eyelid with the eye-drop bottle or the ointment tube. Managing discomfort Wipe any fluid from your eye with a warm, wet washcloth or a cotton ball. Place a clean, cool, wet cloth on your eye. Do this for 10-20 minutes, 3-4 times a day. General instructions Do not wear contacts until the infection is gone. Wear glasses until your doctor says it is  okay to wear contacts again. Do not wear eye makeup until the infection is gone. Throw away old eye makeup. Change or wash your pillowcase every day. Do not share towels or washcloths. Wash your hands often with soap and water for at least 20 seconds and especially before touching your face or eyes. Use paper towels to dry your hands. Do not touch or rub your eyes. Do not drive or use heavy machinery if your vision is blurred. Contact a doctor if: You have a fever. You do not get better after 10 days. Get help right away if: You have a fever and your symptoms get worse all of a sudden. You have very bad pain when you move your eye. Your face: Hurts. Is red. Is swollen. You have sudden loss of vision. Summary Bacterial conjunctivitis is an infection of your conjunctiva. This infection spreads easily from person to person. Wash your hands often with soap and water for at least 20 seconds and especially before touching your face or eyes. Use paper towels to dry your hands. Take or apply your antibiotic medicine as told by your doctor. Contact a doctor if you have a fever or you do not get better after 10 days. This information is not intended to replace advice given to you by your health care provider. Make sure you discuss any questions you have with your health care provider. Document Revised: 05/20/2020 Document Reviewed: 05/20/2020 Elsevier Patient Education  2023 Elsevier Inc.    If you have been instructed to have an in-person evaluation today at a local Urgent Care facility, please use the link below. It will take you to a list of all of our available [facility], including address, phone number and hours of operation. Please do not delay care.  [facility]  If you or a family member do not have a primary care provider, use the link below to schedule a visit and establish care. When you choose a [facility] primary care physician or advanced practice  provider, you gain a long-term partner in health. Find a Primary Care Provider  Learn more about [facility] in-office and virtual care options: [facility] - Get Care Now
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
## 2021-09-24 ENCOUNTER — Ambulatory Visit (INDEPENDENT_AMBULATORY_CARE_PROVIDER_SITE_OTHER): Payer: Medicaid Other

## 2021-09-24 VITALS — BP 96/59 | HR 89 | Temp 98.4°F | Resp 16 | Ht 65.0 in | Wt 154.8 lb

## 2021-09-24 DIAGNOSIS — D508 Other iron deficiency anemias: Secondary | ICD-10-CM

## 2021-09-24 DIAGNOSIS — O99013 Anemia complicating pregnancy, third trimester: Secondary | ICD-10-CM

## 2021-09-24 MED ORDER — DIPHENHYDRAMINE HCL 25 MG PO CAPS
25.0000 mg | ORAL_CAPSULE | Freq: Once | ORAL | Status: AC
  Administered 2021-09-24: 25 mg via ORAL
  Filled 2021-09-24: qty 1

## 2021-09-24 MED ORDER — SODIUM CHLORIDE 0.9 % IV SOLN
200.0000 mg | Freq: Once | INTRAVENOUS | Status: AC
  Administered 2021-09-24: 200 mg via INTRAVENOUS
  Filled 2021-09-24: qty 10

## 2021-09-24 MED ORDER — ACETAMINOPHEN 325 MG PO TABS
650.0000 mg | ORAL_TABLET | Freq: Once | ORAL | Status: AC
  Administered 2021-09-24: 650 mg via ORAL
  Filled 2021-09-24: qty 2

## 2021-09-24 NOTE — Progress Notes (Signed)
Diagnosis: Iron Deficiency Anemia  Provider:  Chilton Greathouse, MD  Procedure: Infusion  IV Type: Peripheral, IV Location: R Antecubital  Venofer (Iron Sucrose), Dose: 200 mg  Infusion Start Time: 1340  Infusion Stop Time: 1400  Post Infusion IV Care: Observation period completed and Peripheral IV Discontinued  Discharge: Condition: Good, Destination: Home . AVS provided to patient.   Performed by:  Adriana Mccallum, RN

## 2021-09-29 ENCOUNTER — Inpatient Hospital Stay (HOSPITAL_COMMUNITY): Payer: Medicaid Other

## 2021-09-29 ENCOUNTER — Ambulatory Visit: Payer: Medicaid Other

## 2021-09-29 ENCOUNTER — Telehealth (HOSPITAL_BASED_OUTPATIENT_CLINIC_OR_DEPARTMENT_OTHER): Payer: Self-pay | Admitting: *Deleted

## 2021-09-29 ENCOUNTER — Ambulatory Visit: Payer: Medicaid Other | Attending: Obstetrics and Gynecology

## 2021-09-29 ENCOUNTER — Encounter (HOSPITAL_COMMUNITY): Payer: Self-pay | Admitting: Obstetrics & Gynecology

## 2021-09-29 ENCOUNTER — Inpatient Hospital Stay (HOSPITAL_COMMUNITY)
Admission: AD | Admit: 2021-09-29 | Discharge: 2021-09-29 | Disposition: A | Discharge status: Home / Self Care | Payer: Medicaid Other | Attending: Obstetrics & Gynecology | Admitting: Obstetrics & Gynecology

## 2021-09-29 DIAGNOSIS — O99353 Diseases of the nervous system complicating pregnancy, third trimester: Secondary | ICD-10-CM | POA: Insufficient documentation

## 2021-09-29 DIAGNOSIS — G43709 Chronic migraine without aura, not intractable, without status migrainosus: Secondary | ICD-10-CM | POA: Insufficient documentation

## 2021-09-29 DIAGNOSIS — O219 Vomiting of pregnancy, unspecified: Secondary | ICD-10-CM | POA: Diagnosis not present

## 2021-09-29 DIAGNOSIS — O212 Late vomiting of pregnancy: Secondary | ICD-10-CM | POA: Insufficient documentation

## 2021-09-29 DIAGNOSIS — Z3A33 33 weeks gestation of pregnancy: Secondary | ICD-10-CM | POA: Diagnosis not present

## 2021-09-29 DIAGNOSIS — O99323 Drug use complicating pregnancy, third trimester: Secondary | ICD-10-CM | POA: Insufficient documentation

## 2021-09-29 DIAGNOSIS — O99613 Diseases of the digestive system complicating pregnancy, third trimester: Secondary | ICD-10-CM | POA: Diagnosis not present

## 2021-09-29 DIAGNOSIS — O98513 Other viral diseases complicating pregnancy, third trimester: Secondary | ICD-10-CM | POA: Diagnosis not present

## 2021-09-29 DIAGNOSIS — O99333 Smoking (tobacco) complicating pregnancy, third trimester: Secondary | ICD-10-CM | POA: Diagnosis not present

## 2021-09-29 DIAGNOSIS — R112 Nausea with vomiting, unspecified: Secondary | ICD-10-CM

## 2021-09-29 LAB — COMPREHENSIVE METABOLIC PANEL
ALT: 10 U/L (ref 0–44)
AST: 17 U/L (ref 15–41)
Albumin: 2.9 g/dL — ABNORMAL LOW (ref 3.5–5.0)
Alkaline Phosphatase: 91 U/L (ref 38–126)
Anion gap: 8 (ref 5–15)
BUN: <5 mg/dL — ABNORMAL LOW (ref 6–20)
CO2: 22 mmol/L (ref 22–32)
Calcium: 8.8 mg/dL — ABNORMAL LOW (ref 8.9–10.3)
Chloride: 107 mmol/L (ref 98–111)
Creatinine, Ser: 0.44 mg/dL (ref 0.44–1.00)
GFR, Estimated: >60 mL/min (ref >60)
Glucose, Bld: 76 mg/dL (ref 70–99)
Potassium: 3.6 mmol/L (ref 3.5–5.1)
Sodium: 137 mmol/L (ref 135–145)
Total Bilirubin: 0.6 mg/dL (ref 0.3–1.2)
Total Protein: 6.2 g/dL — ABNORMAL LOW (ref 6.5–8.1)

## 2021-09-29 LAB — CBC WITH DIFFERENTIAL/PLATELET
Abs Immature Granulocytes: 0.13 10*3/uL — ABNORMAL HIGH (ref 0.00–0.07)
Basophils Absolute: 0 10*3/uL (ref 0.0–0.1)
Basophils Relative: 0 %
Eosinophils Absolute: 0 10*3/uL (ref 0.0–0.5)
Eosinophils Relative: 0 %
HCT: 28.6 % — ABNORMAL LOW (ref 36.0–46.0)
Hemoglobin: 9.5 g/dL — ABNORMAL LOW (ref 12.0–15.0)
Immature Granulocytes: 1 %
Lymphocytes Relative: 8 %
Lymphs Abs: 0.8 10*3/uL (ref 0.7–4.0)
MCH: 24 pg — ABNORMAL LOW (ref 26.0–34.0)
MCHC: 33.2 g/dL (ref 30.0–36.0)
MCV: 72.2 fL — ABNORMAL LOW (ref 80.0–100.0)
Monocytes Absolute: 0.3 10*3/uL (ref 0.1–1.0)
Monocytes Relative: 3 %
Neutro Abs: 8.8 10*3/uL — ABNORMAL HIGH (ref 1.7–7.7)
Neutrophils Relative %: 88 %
Platelets: 272 10*3/uL (ref 150–400)
RBC: 3.96 MIL/uL (ref 3.87–5.11)
RDW: 15.5 % (ref 11.5–15.5)
WBC: 10 10*3/uL (ref 4.0–10.5)
nRBC: 0 % (ref 0.0–0.2)

## 2021-09-29 LAB — URINALYSIS, ROUTINE W REFLEX MICROSCOPIC
Bilirubin Urine: NEGATIVE
Glucose, UA: NEGATIVE mg/dL
Hgb urine dipstick: NEGATIVE
Ketones, ur: 15 mg/dL — AB
Nitrite: NEGATIVE
Protein, ur: 30 mg/dL — AB
Specific Gravity, Urine: 1.02 (ref 1.005–1.030)
pH: 7 (ref 5.0–8.0)

## 2021-09-29 LAB — URINALYSIS, MICROSCOPIC (REFLEX): RBC / HPF: NONE SEEN RBC/hpf (ref 0–5)

## 2021-09-29 MED ORDER — PROMETHAZINE HCL 25 MG PO TABS: 25.0000 mg | ORAL_TABLET | Freq: Four times a day (QID) | ORAL | 0 refills | Status: DC | PRN

## 2021-09-29 MED ORDER — ONDANSETRON HCL 4 MG PO TABS: 4.0000 mg | ORAL_TABLET | Freq: Three times a day (TID) | ORAL | 2 refills | Status: DC | PRN

## 2021-09-29 MED ORDER — ONDANSETRON HCL 4 MG/2ML IJ SOLN
4.0000 mg | Freq: Once | INTRAMUSCULAR | Status: AC
Start: 2021-09-29 — End: 2021-09-29
  Administered 2021-09-29: 4 mg via INTRAVENOUS
  Filled 2021-09-29: qty 2

## 2021-09-29 MED ORDER — FAMOTIDINE IN NACL 20-0.9 MG/50ML-% IV SOLN
20.0000 mg | Freq: Once | INTRAVENOUS | Status: AC
  Administered 2021-09-29: 20 mg via INTRAVENOUS
  Filled 2021-09-29: qty 50

## 2021-09-29 MED ORDER — PROCHLORPERAZINE EDISYLATE 10 MG/2ML IJ SOLN
10.0000 mg | Freq: Once | INTRAMUSCULAR | Status: AC
  Administered 2021-09-29: 10 mg via INTRAVENOUS
  Filled 2021-09-29: qty 2

## 2021-09-29 MED ORDER — FAMOTIDINE 20 MG PO TABS: 20.0000 mg | ORAL_TABLET | Freq: Two times a day (BID) | ORAL | 0 refills | Status: DC

## 2021-09-29 MED ORDER — METOCLOPRAMIDE HCL 10 MG PO TABS: ORAL_TABLET | ORAL | 2 refills | Status: DC

## 2021-09-29 MED ORDER — SCOPOLAMINE 1 MG/3DAYS TD PT72: 1.0000 | MEDICATED_PATCH | TRANSDERMAL | 12 refills | Status: DC

## 2021-09-29 MED ORDER — LACTATED RINGERS IV BOLUS
1000.0000 mL | Freq: Once | INTRAVENOUS | Status: AC
  Administered 2021-09-29: 1000 mL via INTRAVENOUS

## 2021-09-29 MED ORDER — SCOPOLAMINE 1 MG/3DAYS TD PT72
1.0000 | MEDICATED_PATCH | TRANSDERMAL | Status: DC
  Administered 2021-09-29: 1.5 mg via TRANSDERMAL
  Filled 2021-09-29: qty 1

## 2021-09-29 NOTE — Discharge Instructions (Signed)

## 2021-09-29 NOTE — Telephone Encounter (Signed)
Pt called stating that she is vomiting bowel movement. Asked pt what makes her describe it as bowel. She states that it looks and smells like it. She describes it as green in color. Pt has taken nausea medication and it has not helped. Pt states that baby is moving and she is not having any bleeding. Advised that she should go to [facility] for evaluation and possible fluids.

## 2021-09-29 NOTE — MAU Provider Note (Signed)
History     CSN: [id number]  Arrival date and time: 09/29/21 0856   Event Date/Time   First Provider Initiated Contact with Patient 09/29/21 [phone]     Chief Complaint  Patient presents with   Nausea   HPI  Alexis [name] is a 26 y.o. G3P2002 at [redacted]w[redacted]d who presents for evaluation of vomiting. Patient reports she woke up with vomiting overnight. She states she is throwing up stool. She reports it is dark brown and green and smells like stool. She reports she has a history of bowel issues in the past and is worried she has an obstruction. She reports she hasn't had a bowel movement in 2 weeks.   She denies any pain. She denies any vaginal bleeding, discharge, and leaking of fluid.Reports normal fetal movement.   OB History     Gravida  3   Para  2   Term  2   Preterm      AB      Living  2      SAB      IAB      Ectopic      Multiple  0   Live Births  1           Past Medical History:  Diagnosis Date   Beta thalassemia trait    Family history of breast cancer in mother    Gonorrhea 12/2020   treated   Marijuana abuse    Seizures (HCC)    last seizure in 2021   Shingles outbreak 10/2018   Trichomonas infection 02/2021    Past Surgical History:  Procedure Laterality Date   NO PAST SURGERIES      Family History  Problem Relation Age of Onset   Cancer Mother 32       breast   Heart disease Father    Heart disease Paternal Grandmother    Heart disease Paternal Grandfather     Social History   Tobacco Use   Smoking status: Every Day    Packs/day: 0.25    Types: Cigarettes    Passive exposure: Never   Smokeless tobacco: Never   Tobacco comments:    5=6 cigs a day  Vaping Use   Vaping Use: Never used  Substance Use Topics   Alcohol use: No   Drug use: Not Currently    Types: Marijuana    Comment: at times    Allergies: No Known Allergies  Medications Prior to Admission  Medication Sig Dispense Refill Last Dose   metoCLOPramide  (REGLAN) 10 MG tablet Take 1 tablet with onset of headache 20 tablet 0 09/29/2021 at 0100   ondansetron (ZOFRAN) 4 MG tablet Take 1 tablet (4 mg total) by mouth every 8 (eight) hours as needed for nausea or vomiting. 30 tablet 0 09/29/2021 at 0100   Prenatal Vit-Fe Fumarate-FA (PRENATAL PLUS VITAMIN/MINERAL) 27-1 MG TABS Take 1 tablet by mouth daily. 30 tablet 11 09/28/2021   acetaminophen (TYLENOL) 325 MG tablet Take 500 mg by mouth every 6 (six) hours as needed.      cyclobenzaprine (FLEXERIL) 5 MG tablet Take 1 tablet (5 mg total) by mouth 3 (three) times daily as needed for muscle spasms. 30 tablet 0    ferrous sulfate 325 (65 FE) MG EC tablet Take 1 tablet (325 mg total) by mouth every other day. (Patient not taking: Reported on 09/23/2021) 30 tablet 2     Review of Systems  Constitutional: Negative.  Negative for fatigue and  fever.  HENT: Negative.    Respiratory: Negative.  Negative for shortness of breath.   Cardiovascular: Negative.  Negative for chest pain.  Gastrointestinal:  Positive for constipation and nausea. Negative for abdominal pain, diarrhea and vomiting.  Genitourinary: Negative.  Negative for dysuria, vaginal bleeding and vaginal discharge.  Neurological: Negative.  Negative for dizziness and headaches.   Physical Exam   Blood pressure 108/64, pulse 97, temperature 98 F (36.7 C), temperature source Oral, resp. rate 15, height 5\' 5"  (1.651 m), weight 69.9 kg, last menstrual period 02/05/2021, SpO2 100 %.  Patient Vitals for the past 24 hrs:  BP Temp Temp src Pulse Resp SpO2 Height Weight  09/29/21 0931 108/64 -- -- 97 -- 100 % -- --  09/29/21 0914 107/64 98 F (36.7 C) Oral 95 15 100 % 5\' 5"  (1.651 m) 69.9 kg    Physical Exam Vitals and nursing note reviewed.  Constitutional:      General: She is not in acute distress.    Appearance: She is well-developed.  HENT:     Head: Normocephalic.  Eyes:     Pupils: Pupils are equal, round, and reactive to light.   Cardiovascular:     Rate and Rhythm: Normal rate and regular rhythm.     Heart sounds: Normal heart sounds.  Pulmonary:     Effort: Pulmonary effort is normal. No respiratory distress.     Breath sounds: Normal breath sounds.  Abdominal:     General: Bowel sounds are normal. There is no distension.     Palpations: Abdomen is soft.     Tenderness: There is no abdominal tenderness.  Skin:    General: Skin is warm and dry.  Neurological:     Mental Status: She is alert and oriented to person, place, and time.  Psychiatric:        Mood and Affect: Mood normal.        Behavior: Behavior normal.        Thought Content: Thought content normal.        Judgment: Judgment normal.     Fetal Tracing:  Baseline: 120 Variability: moderate Accels: 15x15 Decels: none  Toco: none     MAU Course  Procedures  Results for orders placed or performed during the hospital encounter of 09/29/21 (from the past 24 hour(s))  Urinalysis, Routine w reflex microscopic Urine, Clean Catch     Status: Abnormal   Collection Time: 09/29/21  9:51 AM  Result Value Ref Range   Color, Urine YELLOW YELLOW   APPearance HAZY (A) CLEAR   Specific Gravity, Urine 1.020 1.005 - 1.030   pH 7.0 5.0 - 8.0   Glucose, UA NEGATIVE NEGATIVE mg/dL   Hgb urine dipstick NEGATIVE NEGATIVE   Bilirubin Urine NEGATIVE NEGATIVE   Ketones, ur 15 (A) NEGATIVE mg/dL   Protein, ur 30 (A) NEGATIVE mg/dL   Nitrite NEGATIVE NEGATIVE   Leukocytes,Ua TRACE (A) NEGATIVE  Urinalysis, Microscopic (reflex)     Status: Abnormal   Collection Time: 09/29/21  9:51 AM  Result Value Ref Range   RBC / HPF NONE SEEN 0 - 5 RBC/hpf   WBC, UA 0-5 0 - 5 WBC/hpf   Bacteria, UA FEW (A) NONE SEEN   Squamous Epithelial / LPF 21-50 0 - 5   Mucus PRESENT   CBC with Differential/Platelet     Status: Abnormal   Collection Time: 09/29/21  9:59 AM  Result Value Ref Range   WBC 10.0 4.0 - 10.5 K/uL  RBC 3.96 3.87 - 5.11 MIL/uL   Hemoglobin 9.5  (L) 12.0 - 15.0 g/dL   HCT 66.0 (L) 63.0 - 16.0 %   MCV 72.2 (L) 80.0 - 100.0 fL   MCH 24.0 (L) 26.0 - 34.0 pg   MCHC 33.2 30.0 - 36.0 g/dL   RDW 10.9 32.3 - 55.7 %   Platelets 272 150 - 400 K/uL   nRBC 0.0 0.0 - 0.2 %   Neutrophils Relative % 88 %   Neutro Abs 8.8 (H) 1.7 - 7.7 K/uL   Lymphocytes Relative 8 %   Lymphs Abs 0.8 0.7 - 4.0 K/uL   Monocytes Relative 3 %   Monocytes Absolute 0.3 0.1 - 1.0 K/uL   Eosinophils Relative 0 %   Eosinophils Absolute 0.0 0.0 - 0.5 K/uL   Basophils Relative 0 %   Basophils Absolute 0.0 0.0 - 0.1 K/uL   Immature Granulocytes 1 %   Abs Immature Granulocytes 0.13 (H) 0.00 - 0.07 K/uL  Comprehensive metabolic panel     Status: Abnormal   Collection Time: 09/29/21  9:59 AM  Result Value Ref Range   Sodium 137 135 - 145 mmol/L   Potassium 3.6 3.5 - 5.1 mmol/L   Chloride 107 98 - 111 mmol/L   CO2 22 22 - 32 mmol/L   Glucose, Bld 76 70 - 99 mg/dL   BUN <5 (L) 6 - 20 mg/dL   Creatinine, Ser 3.22 0.44 - 1.00 mg/dL   Calcium 8.8 (L) 8.9 - 10.3 mg/dL   Total Protein 6.2 (L) 6.5 - 8.1 g/dL   Albumin 2.9 (L) 3.5 - 5.0 g/dL   AST 17 15 - 41 U/L   ALT 10 0 - 44 U/L   Alkaline Phosphatase 91 38 - 126 U/L   Total Bilirubin 0.6 0.3 - 1.2 mg/dL   GFR, Estimated >02 >54 mL/min   Anion gap 8 5 - 15     DG Abd 2 Views  Result Date: 09/29/2021 CLINICAL DATA:  [id number]; evaluate for small bowel obstruction/constipation. Patient has not had a bowel movement in 2 weeks. Patient is [redacted] weeks pregnant (filled out consent form for the abdominal x-rays per the technologist's note) EXAM: ABDOMEN - 2 VIEW COMPARISON:  None Available. FINDINGS: AP erect and AP supine views of the abdomen were obtained. There is an intrauterine pregnancy with vertex presentation. No free air in the peritoneal cavity. Visualized lung fields are clear. Bowel-gas pattern is nonobstructive. No significant stool burden seen in the colon. No radio-opaque calculi or other significant radiographic  abnormality is seen. IMPRESSION: Single intrauterine pregnancy with vertex presentation. Bowel-gas pattern is nonobstructive. No significant stool burden seen in the colon. Electronically Signed   By: Marjo Bicker M.D.   On: 09/29/2021 11:40     MDM Labs ordered and reviewed.   UA LR bolus  Pepcid Compazine Zofran  CNM consulted with Dr. Despina Hidden regarding presentation and results- MD recommends abdominal xray to rule out obstruction  Assessment and Plan   1. Nausea and vomiting during pregnancy   2. [redacted] weeks gestation of pregnancy   3. Chronic migraine without aura without status migrainosus, not intractable   4. Nausea and vomiting, unspecified vomiting type     -Discharge home in stable condition -Rx for pepcid, zofran and phenergan sent to pharmacy -Constipation precautions discussed -Patient advised to follow-up with OB as scheduled for prenatal care -Patient may return to MAU as needed or if her condition were to change or worsen  [place] Citizen  Leone Payor, CNM 09/29/2021, 9:47 AM

## 2021-09-29 NOTE — MAU Note (Signed)
...  Alexis [name] is a 26 y.o. at [redacted]w[redacted]d here in MAU reporting: Nausea and vomiting her entire pregnancy but reports early this morning it became severe around 0100. She reports she has not been able to have a bowel movement and believes it is her stool. She reports "I taste it, the smell of it, it smells like straight poop and is a dark green color and is now like a lime green color." She reports her last bowel movement was two weeks ago. She reports she had a bowel "blockage" last year in November and had sepsis. She reports she did not sleep at all last night and is extremely fatigued. She is also endorsing a HA that began around 0100 as well. She reports she has vomited over 15 times since 0100. Denies VB or LOF. +FM.  Took ODT Zofran at 0100 this morning as well as Reglan. Vomited her Reglan up.   Pain score: 8/10 HA - frontal  FHT: 142 doppler Lab orders placed from triage:  UA

## 2021-10-01 ENCOUNTER — Ambulatory Visit (INDEPENDENT_AMBULATORY_CARE_PROVIDER_SITE_OTHER): Payer: Medicaid Other

## 2021-10-01 VITALS — BP 96/59 | HR 83 | Temp 98.5°F | Resp 18 | Ht 65.0 in | Wt 159.2 lb

## 2021-10-01 DIAGNOSIS — O99013 Anemia complicating pregnancy, third trimester: Secondary | ICD-10-CM

## 2021-10-01 MED ORDER — ACETAMINOPHEN 325 MG PO TABS: 650.0000 mg | ORAL_TABLET | Freq: Once | ORAL | Status: DC

## 2021-10-01 MED ORDER — SODIUM CHLORIDE 0.9 % IV SOLN
200.0000 mg | Freq: Once | INTRAVENOUS | Status: AC
  Administered 2021-10-01: 200 mg via INTRAVENOUS
  Filled 2021-10-01: qty 10

## 2021-10-01 MED ORDER — DIPHENHYDRAMINE HCL 25 MG PO CAPS: 25.0000 mg | ORAL_CAPSULE | Freq: Once | ORAL | Status: DC

## 2021-10-01 NOTE — Progress Notes (Signed)
Diagnosis: Iron Deficiency Anemia  Provider:  Chilton Greathouse MD  Procedure: Infusion  IV Type: Peripheral, IV Location: R Antecubital  Venofer (Iron Sucrose), Dose: 200 mg  Infusion Start Time: 1452  Infusion Stop Time: 1512  Post Infusion IV Care: Peripheral IV Discontinued  Discharge: Condition: Good, Destination: Home . AVS provided to patient.   Performed by:  Adriana Mccallum, RN

## 2021-10-04 ENCOUNTER — Other Ambulatory Visit (HOSPITAL_BASED_OUTPATIENT_CLINIC_OR_DEPARTMENT_OTHER): Payer: Self-pay

## 2021-10-04 ENCOUNTER — Ambulatory Visit (INDEPENDENT_AMBULATORY_CARE_PROVIDER_SITE_OTHER): Payer: Medicaid Other | Admitting: Obstetrics & Gynecology

## 2021-10-04 ENCOUNTER — Encounter: Payer: Self-pay | Admitting: Obstetrics & Gynecology

## 2021-10-04 VITALS — BP 119/79 | HR 134 | Wt 157.0 lb

## 2021-10-04 DIAGNOSIS — O219 Vomiting of pregnancy, unspecified: Secondary | ICD-10-CM

## 2021-10-04 DIAGNOSIS — O09899 Supervision of other high risk pregnancies, unspecified trimester: Secondary | ICD-10-CM

## 2021-10-04 DIAGNOSIS — K5900 Constipation, unspecified: Secondary | ICD-10-CM

## 2021-10-04 DIAGNOSIS — F17200 Nicotine dependence, unspecified, uncomplicated: Secondary | ICD-10-CM

## 2021-10-04 DIAGNOSIS — O0990 Supervision of high risk pregnancy, unspecified, unspecified trimester: Secondary | ICD-10-CM

## 2021-10-04 DIAGNOSIS — R87612 Low grade squamous intraepithelial lesion on cytologic smear of cervix (LGSIL): Secondary | ICD-10-CM

## 2021-10-04 DIAGNOSIS — D563 Thalassemia minor: Secondary | ICD-10-CM

## 2021-10-04 DIAGNOSIS — O99013 Anemia complicating pregnancy, third trimester: Secondary | ICD-10-CM

## 2021-10-04 DIAGNOSIS — O365930 Maternal care for other known or suspected poor fetal growth, third trimester, not applicable or unspecified: Secondary | ICD-10-CM

## 2021-10-04 DIAGNOSIS — O0993 Supervision of high risk pregnancy, unspecified, third trimester: Secondary | ICD-10-CM

## 2021-10-04 DIAGNOSIS — Z3A34 34 weeks gestation of pregnancy: Secondary | ICD-10-CM

## 2021-10-04 DIAGNOSIS — Z8619 Personal history of other infectious and parasitic diseases: Secondary | ICD-10-CM

## 2021-10-04 DIAGNOSIS — Z2839 Other underimmunization status: Secondary | ICD-10-CM

## 2021-10-04 DIAGNOSIS — O99613 Diseases of the digestive system complicating pregnancy, third trimester: Secondary | ICD-10-CM

## 2021-10-04 DIAGNOSIS — G40909 Epilepsy, unspecified, not intractable, without status epilepticus: Secondary | ICD-10-CM

## 2021-10-04 MED ORDER — FAMOTIDINE 20 MG PO TABS
20.0000 mg | ORAL_TABLET | Freq: Two times a day (BID) | ORAL | 3 refills | Status: DC
  Filled 2021-10-04 (×2): qty 60, 30d supply, fill #0

## 2021-10-04 MED ORDER — SCOPOLAMINE 1 MG/3DAYS TD PT72
1.0000 | MEDICATED_PATCH | TRANSDERMAL | 4 refills | Status: DC
Start: 2021-10-04 — End: 2021-10-21
  Filled 2021-10-04 (×2): qty 10, 30d supply, fill #0
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
## 2021-10-04 NOTE — Patient Instructions (Signed)
Docusate sodium 100mg .  Take twice daily.

## 2021-10-05 ENCOUNTER — Other Ambulatory Visit (HOSPITAL_BASED_OUTPATIENT_CLINIC_OR_DEPARTMENT_OTHER): Payer: Self-pay

## 2021-10-06 ENCOUNTER — Other Ambulatory Visit: Payer: Self-pay | Admitting: *Deleted

## 2021-10-06 ENCOUNTER — Ambulatory Visit: Payer: Medicaid Other | Admitting: *Deleted

## 2021-10-06 ENCOUNTER — Ambulatory Visit: Payer: Medicaid Other | Attending: Obstetrics and Gynecology

## 2021-10-06 VITALS — BP 103/59 | HR 93

## 2021-10-06 DIAGNOSIS — D563 Thalassemia minor: Secondary | ICD-10-CM | POA: Diagnosis present

## 2021-10-06 DIAGNOSIS — Z3A34 34 weeks gestation of pregnancy: Secondary | ICD-10-CM

## 2021-10-06 DIAGNOSIS — O285 Abnormal chromosomal and genetic finding on antenatal screening of mother: Secondary | ICD-10-CM | POA: Diagnosis not present

## 2021-10-06 DIAGNOSIS — O365931 Maternal care for other known or suspected poor fetal growth, third trimester, fetus 1: Secondary | ICD-10-CM | POA: Diagnosis present

## 2021-10-06 DIAGNOSIS — O365930 Maternal care for other known or suspected poor fetal growth, third trimester, not applicable or unspecified: Secondary | ICD-10-CM

## 2021-10-06 DIAGNOSIS — O0990 Supervision of high risk pregnancy, unspecified, unspecified trimester: Secondary | ICD-10-CM | POA: Diagnosis present

## 2021-10-06 DIAGNOSIS — O99333 Smoking (tobacco) complicating pregnancy, third trimester: Secondary | ICD-10-CM | POA: Diagnosis not present

## 2021-10-06 DIAGNOSIS — F17210 Nicotine dependence, cigarettes, uncomplicated: Secondary | ICD-10-CM

## 2021-10-06 DIAGNOSIS — T1490XD Injury, unspecified, subsequent encounter: Secondary | ICD-10-CM

## 2021-10-06 DIAGNOSIS — O99353 Diseases of the nervous system complicating pregnancy, third trimester: Secondary | ICD-10-CM

## 2021-10-06 DIAGNOSIS — O9A213 Injury, poisoning and certain other consequences of external causes complicating pregnancy, third trimester: Secondary | ICD-10-CM

## 2021-10-06 DIAGNOSIS — G40909 Epilepsy, unspecified, not intractable, without status epilepticus: Secondary | ICD-10-CM

## 2021-10-07 ENCOUNTER — Encounter (HOSPITAL_COMMUNITY): Payer: Self-pay | Admitting: Obstetrics & Gynecology

## 2021-10-07 ENCOUNTER — Encounter (HOSPITAL_BASED_OUTPATIENT_CLINIC_OR_DEPARTMENT_OTHER): Payer: Medicaid Other | Admitting: Obstetrics & Gynecology

## 2021-10-07 ENCOUNTER — Inpatient Hospital Stay (HOSPITAL_COMMUNITY)
Admission: AD | Admit: 2021-10-07 | Discharge: 2021-10-07 | Disposition: A | Discharge status: Home / Self Care | Payer: Medicaid Other | Attending: Obstetrics & Gynecology | Admitting: Obstetrics & Gynecology

## 2021-10-07 DIAGNOSIS — N898 Other specified noninflammatory disorders of vagina: Secondary | ICD-10-CM

## 2021-10-07 DIAGNOSIS — O4700 False labor before 37 completed weeks of gestation, unspecified trimester: Secondary | ICD-10-CM

## 2021-10-07 DIAGNOSIS — M545 Low back pain, unspecified: Secondary | ICD-10-CM | POA: Diagnosis not present

## 2021-10-07 DIAGNOSIS — O26899 Other specified pregnancy related conditions, unspecified trimester: Secondary | ICD-10-CM | POA: Diagnosis not present

## 2021-10-07 DIAGNOSIS — O26893 Other specified pregnancy related conditions, third trimester: Secondary | ICD-10-CM | POA: Diagnosis not present

## 2021-10-07 DIAGNOSIS — Z3A34 34 weeks gestation of pregnancy: Secondary | ICD-10-CM | POA: Diagnosis not present

## 2021-10-07 DIAGNOSIS — R109 Unspecified abdominal pain: Secondary | ICD-10-CM

## 2021-10-07 LAB — AMNISURE RUPTURE OF MEMBRANE (ROM) NOT AT ARMC: Amnisure ROM: NEGATIVE

## 2021-10-07 LAB — CBC WITH DIFFERENTIAL/PLATELET
Abs Immature Granulocytes: 0.32 10*3/uL — ABNORMAL HIGH (ref 0.00–0.07)
Basophils Absolute: 0 10*3/uL (ref 0.0–0.1)
Basophils Relative: 0 %
Eosinophils Absolute: 0.1 10*3/uL (ref 0.0–0.5)
Eosinophils Relative: 1 %
HCT: 27.5 % — ABNORMAL LOW (ref 36.0–46.0)
Hemoglobin: 10 g/dL — ABNORMAL LOW (ref 12.0–15.0)
Immature Granulocytes: 3 %
Lymphocytes Relative: 11 %
Lymphs Abs: 1.4 10*3/uL (ref 0.7–4.0)
MCH: 26.2 pg (ref 26.0–34.0)
MCHC: 36.4 g/dL — ABNORMAL HIGH (ref 30.0–36.0)
MCV: 72 fL — ABNORMAL LOW (ref 80.0–100.0)
Monocytes Absolute: 0.6 10*3/uL (ref 0.1–1.0)
Monocytes Relative: 5 %
Neutro Abs: 10.2 10*3/uL — ABNORMAL HIGH (ref 1.7–7.7)
Neutrophils Relative %: 80 %
Platelets: 283 10*3/uL (ref 150–400)
RBC: 3.82 MIL/uL — ABNORMAL LOW (ref 3.87–5.11)
RDW: 15.9 % — ABNORMAL HIGH (ref 11.5–15.5)
WBC: 12.6 10*3/uL — ABNORMAL HIGH (ref 4.0–10.5)
nRBC: 0 % (ref 0.0–0.2)

## 2021-10-07 LAB — COMPREHENSIVE METABOLIC PANEL
ALT: 7 U/L (ref 0–44)
AST: 13 U/L — ABNORMAL LOW (ref 15–41)
Albumin: 3 g/dL — ABNORMAL LOW (ref 3.5–5.0)
Alkaline Phosphatase: 102 U/L (ref 38–126)
Anion gap: 8 (ref 5–15)
BUN: <5 mg/dL — ABNORMAL LOW (ref 6–20)
CO2: 21 mmol/L — ABNORMAL LOW (ref 22–32)
Calcium: 8.6 mg/dL — ABNORMAL LOW (ref 8.9–10.3)
Chloride: 108 mmol/L (ref 98–111)
Creatinine, Ser: 0.44 mg/dL (ref 0.44–1.00)
GFR, Estimated: >60 mL/min (ref >60)
Glucose, Bld: 81 mg/dL (ref 70–99)
Potassium: 3.6 mmol/L (ref 3.5–5.1)
Sodium: 137 mmol/L (ref 135–145)
Total Bilirubin: 0.6 mg/dL (ref 0.3–1.2)
Total Protein: 6.2 g/dL — ABNORMAL LOW (ref 6.5–8.1)

## 2021-10-07 MED ORDER — LACTATED RINGERS IV SOLN: INTRAVENOUS | Status: DC

## 2021-10-07 MED ORDER — FENTANYL CITRATE (PF) 100 MCG/2ML IJ SOLN
50.0000 ug | Freq: Once | INTRAMUSCULAR | Status: AC
  Administered 2021-10-07: 50 ug via INTRAVENOUS
  Filled 2021-10-07: qty 2

## 2021-10-07 MED ORDER — ONDANSETRON HCL 4 MG/2ML IJ SOLN
4.0000 mg | Freq: Once | INTRAMUSCULAR | Status: AC
  Administered 2021-10-07: 4 mg via INTRAVENOUS
  Filled 2021-10-07: qty 2

## 2021-10-07 MED ORDER — NIFEDIPINE 10 MG PO CAPS: 10.0000 mg | ORAL_CAPSULE | Freq: Four times a day (QID) | ORAL | 1 refills | Status: DC | PRN

## 2021-10-07 MED ORDER — CYCLOBENZAPRINE HCL 5 MG PO TABS
5.0000 mg | ORAL_TABLET | Freq: Once | ORAL | Status: AC
  Administered 2021-10-07: 5 mg via ORAL
  Filled 2021-10-07: qty 1

## 2021-10-07 MED ORDER — TERBUTALINE SULFATE 1 MG/ML IJ SOLN
0.2500 mg | Freq: Once | INTRAMUSCULAR | Status: AC
  Administered 2021-10-07: 0.25 mg via SUBCUTANEOUS
  Filled 2021-10-07: qty 1

## 2021-10-07 NOTE — MAU Provider Note (Addendum)
Chief Complaint:  Contractions and Rupture of Membranes   Event Date/Time   First Provider Initiated Contact with Patient 10/07/21 0242     HPI: Alexis [name] is a 26 y.o. G3P2002 at 80w6dwho presents via EMS to maternity admissions reporting frequent contractions, water leaking and "need to push".  Taken to room and examined, cervix found to be posterior and almost 3cm (unable to reach to get both fingers in).  Uterine irritability noted on monitor with irregular contractions.  Patient emotional and complaining of pain and feeling like the baby was coming. .she was writhing around in bed, difficult to calm.  She reports good fetal movement, denies vaginal bleeding, diarrhea, constipation or fever/chills.    Abdominal Pain This is a new problem. The current episode started today. The problem occurs intermittently. The pain is located in the LLQ, RLQ and suprapubic region. The pain is at a severity of 10/10. The pain is severe. The quality of the pain is cramping. The abdominal pain does not radiate. Associated symptoms include nausea. Pertinent negatives include no diarrhea, dysuria, fever, frequency or headaches. Nothing aggravates the pain. The pain is relieved by Nothing. She has tried nothing for the symptoms.  Vaginal Discharge The patient's primary symptoms include vaginal discharge. Associated symptoms include abdominal pain and nausea. Pertinent negatives include no diarrhea, dysuria, fever, frequency or headaches. The vaginal discharge was clear and watery. There has been no bleeding. She has not been passing clots. She has not been passing tissue. Nothing aggravates the symptoms. She has tried nothing for the symptoms.   RN note: Alexis [name] is a 26 y.o. at [redacted]w[redacted]d here in MAU reporting: SROM a gush with clear fluid and CTX since 0130 this am. Pt denies DFM, VB, PIH s/s, abnormal discharge, recent intercourse.  IUGR and Hx of seizures not on medications Onset of complaint:  0130 Pain score: 10/10  Past Medical History: Past Medical History:  Diagnosis Date   Beta thalassemia trait    Family history of breast cancer in mother    Gonorrhea 12/2020   treated   Marijuana abuse    Seizures (HCC)    last seizure in 2021   Shingles outbreak 10/2018   Trichomonas infection 02/2021    Past obstetric history: OB History  Gravida Para Term Preterm AB Living  3 2 2     2   SAB IAB Ectopic Multiple Live Births        0 1    # Outcome Date GA Lbr Len/2nd Weight Sex Delivery Anes PTL Lv  3 Current           2 Term 02/08/19 [redacted]w[redacted]d 16:10 / 01:46 2705 g F Vag-Spont EPI  LIV  1 Term 10/27/09 104w0d   M Vag-Spont       Past Surgical History: Past Surgical History:  Procedure Laterality Date   NO PAST SURGERIES      Family History: Family History  Problem Relation Age of Onset   Cancer Mother 32       breast   Heart disease Father    Heart disease Paternal Grandmother    Heart disease Paternal Grandfather     Social History: Social History   Tobacco Use   Smoking status: Every Day    Packs/day: 0.25    Types: Cigarettes    Passive exposure: Never   Smokeless tobacco: Never   Tobacco comments:    5=6 cigs a day  Vaping Use   Vaping Use: Never used  Substance Use Topics   Alcohol use: No   Drug use: Not Currently    Types: Marijuana    Comment: at times    Allergies: No Known Allergies  Meds:  Medications Prior to Admission  Medication Sig Dispense Refill Last Dose   acetaminophen (TYLENOL) 325 MG tablet Take 500 mg by mouth every 6 (six) hours as needed.      cyclobenzaprine (FLEXERIL) 5 MG tablet Take 1 tablet (5 mg total) by mouth 3 (three) times daily as needed for muscle spasms. 30 tablet 0    famotidine (PEPCID) 20 MG tablet Take 1 tablet (20 mg total) by mouth 2 (two) times daily. 60 tablet 3    ferrous sulfate 325 (65 FE) MG EC tablet Take 1 tablet (325 mg total) by mouth every other day. 30 tablet 2    metoCLOPramide (REGLAN) 10  MG tablet Take 1 tablet with onset of headache 30 tablet 2    ondansetron (ZOFRAN) 4 MG tablet Take 1 tablet (4 mg total) by mouth every 8 (eight) hours as needed for nausea or vomiting. 30 tablet 2    Prenatal Vit-Fe Fumarate-FA (PRENATAL PLUS VITAMIN/MINERAL) 27-1 MG TABS Take 1 tablet by mouth daily. 30 tablet 11    promethazine (PHENERGAN) 25 MG tablet Take 1 tablet (25 mg total) by mouth every 6 (six) hours as needed for nausea or vomiting. (Patient not taking: Reported on 10/04/2021) 30 tablet 0    scopolamine (TRANSDERM-SCOP) 1 MG/3DAYS Place 1 patch (1.5 mg total) onto the skin every 3 (three) days. 10 patch 4     I have reviewed patient's Past Medical Hx, Surgical Hx, Family Hx, Social Hx, medications and allergies.   ROS:  Review of Systems  Constitutional:  Negative for fever.  Gastrointestinal:  Positive for abdominal pain and nausea. Negative for diarrhea.  Genitourinary:  Positive for vaginal discharge. Negative for dysuria and frequency.  Neurological:  Negative for headaches.   Other systems negative  Physical Exam  Patient Vitals for the past 24 hrs:  BP Temp Temp src Pulse Resp SpO2  10/07/21 0214 106/69 98.3 F (36.8 C) Oral 76 18 --  10/07/21 0213 -- -- -- -- -- 99 %   Constitutional: Well-developed, well-nourished female in no acute distress.  Cardiovascular: normal rate and rhythm Respiratory: normal effort, clear to auscultation bilaterally GI: Abd soft, non-tender, gravid appropriate for gestational age.   No rebound or guarding. MS: Extremities nontender, no edema, normal ROM Neurologic: Alert and oriented x 4.  GU: Neg CVAT.  PELVIC EXAM:  Dilation: 3 Effacement (%): 70 Cervical Position: Posterior Station: -3 Presentation: Vertex Exam by:: Alleyne Lac CNM Cervix is very posterior, cannot get both fingers inside, sweep side to side is about 2.5-3cm by my estimation.   FHT:  Baseline 120-125 , moderate variability, accelerations present, no  decelerations Contractions: Irregular with irritability between.    Labs: Results for orders placed or performed during the hospital encounter of 10/07/21 (from the past 24 hour(s))  Amnisure rupture of membrane (rom)not at [facility]     Status: None   Collection Time: 10/07/21  2:39 AM  Result Value Ref Range   Amnisure ROM NEGATIVE   CBC with Differential/Platelet     Status: Abnormal   Collection Time: 10/07/21  2:52 AM  Result Value Ref Range   WBC 12.6 (H) 4.0 - 10.5 K/uL   RBC 3.82 (L) 3.87 - 5.11 MIL/uL   Hemoglobin 10.0 (L) 12.0 - 15.0 g/dL   HCT 66.0 (L)  36.0 - 46.0 %   MCV 72.0 (L) 80.0 - 100.0 fL   MCH 26.2 26.0 - 34.0 pg   MCHC 36.4 (H) 30.0 - 36.0 g/dL   RDW 53.6 (H) 64.4 - 03.4 %   Platelets 283 150 - 400 K/uL   nRBC 0.0 0.0 - 0.2 %   Neutrophils Relative % 80 %   Neutro Abs 10.2 (H) 1.7 - 7.7 K/uL   Lymphocytes Relative 11 %   Lymphs Abs 1.4 0.7 - 4.0 K/uL   Monocytes Relative 5 %   Monocytes Absolute 0.6 0.1 - 1.0 K/uL   Eosinophils Relative 1 %   Eosinophils Absolute 0.1 0.0 - 0.5 K/uL   Basophils Relative 0 %   Basophils Absolute 0.0 0.0 - 0.1 K/uL   Immature Granulocytes 3 %   Abs Immature Granulocytes 0.32 (H) 0.00 - 0.07 K/uL  Comprehensive metabolic panel     Status: Abnormal   Collection Time: 10/07/21  2:52 AM  Result Value Ref Range   Sodium 137 135 - 145 mmol/L   Potassium 3.6 3.5 - 5.1 mmol/L   Chloride 108 98 - 111 mmol/L   CO2 21 (L) 22 - 32 mmol/L   Glucose, Bld 81 70 - 99 mg/dL   BUN <5 (L) 6 - 20 mg/dL   Creatinine, Ser 7.42 0.44 - 1.00 mg/dL   Calcium 8.6 (L) 8.9 - 10.3 mg/dL   Total Protein 6.2 (L) 6.5 - 8.1 g/dL   Albumin 3.0 (L) 3.5 - 5.0 g/dL   AST 13 (L) 15 - 41 U/L   ALT 7 0 - 44 U/L   Alkaline Phosphatase 102 38 - 126 U/L   Total Bilirubin 0.6 0.3 - 1.2 mg/dL   GFR, Estimated >59 >56 mL/min   Anion gap 8 5 - 15    --/--/O POS (06/08 3875)  Imaging:   MAU Course/MDM: I have reviewed the triage vital signs and the nursing  notes.   Pertinent labs & imaging results that were available during my care of the patient were reviewed by me and considered in my medical decision making (see chart for details).      I have reviewed her medical records including past results, notes and treatments.   I have ordered labs and reviewed results. These are normal  NST reviewed, reactive Consult Dr Despina Hidden with presentation, exam findings and test results. He recommends IVF and Terbutaline to diminish contractions.    This was given with decrease in frequency of UCs, though patient states they are still just as painful   Two separate doses of Fentanyl given for pain.    Patient had one incident where she was a little confused, shaky and then somnolent ( (but arousable)., likely from Fentanyl (though only was given)  Also could have been a bit of panic from side effects of Terbutaline.  Patient was easily calmed with talking and reassurance FOB states he thinks she had a seizure (no seizure like activity was observed by [place] as we were at bedside entire time).  He states she "flatlined at [facility] ER when they gave her pain medicine".   Chart review revealed an admission for gastroenteritis when she was given several doses of Dilaudid and became somnolent, requiring Narcan.  Did not have any cardiac arrest.   Cervix check at 0222 and 0408 were the same.  Cervix still very posterior and only 50% effaced.  Presenting part hard to feel as it is now floating/high.  Exam negative for ruptured membranes, Amnisure negative  Patient sleeping at intervals, uterine irritability noted off and on, no contractions longer than 30-40 seconds.    Labs sent and reviewed, and were normal.    Reviewed above with Dr Despina Hidden who states patient may go home   Will Rx Procardia (short acting) for PRN use at home for PTL . Has Flexeril at home for back pain. Will message office to get her in for recheck tomorrow or Monday.   Assessment: [place]  IUP at [redacted]w[redacted]d Irregular preterm contractions Small cervical dilation with no change over time in MAU Low back pain  Plan: Discharge home Rx Procardia for PRN use for PTL at home Has Flexeril at home for back pain Preterm Labor precautions and fetal kick counts Follow up in Office for prenatal visits and recheck Encouraged to return if she develops worsening of symptoms, increase in pain, fever, or other concerning symptoms.  Pt stable at time of discharge.  Wynelle Bourgeois CNM, MSN Certified Nurse-Midwife 10/07/2021 2:42 AM

## 2021-10-07 NOTE — MAU Note (Signed)
.  Alexis [name] is a 26 y.o. at [redacted]w[redacted]d here in MAU reporting: SROM a gush with clear fluid and CTX since 0130 this am. Pt denies DFM, VB, PIH s/s, abnormal discharge, recent intercourse.  IUGR and Hx of seizures not on medications Onset of complaint: 0130 Pain score: 10/10 Vitals:   10/07/21 0213 10/07/21 0214  BP:  106/69  Pulse:  76  Resp:  18  Temp:  98.3 F (36.8 C)  SpO2: 99%      FHT:130 Lab orders placed from triage:    SVE 3/50/-3 performed by Wynelle Bourgeois, CNM

## 2021-10-08 ENCOUNTER — Ambulatory Visit: Payer: Self-pay

## 2021-10-08 MED ORDER — ACETAMINOPHEN 325 MG PO TABS: 650.0000 mg | ORAL_TABLET | Freq: Once | ORAL | Status: DC

## 2021-10-08 MED ORDER — DIPHENHYDRAMINE HCL 25 MG PO CAPS: 25.0000 mg | ORAL_CAPSULE | Freq: Once | ORAL | Status: DC

## 2021-10-08 MED ORDER — SODIUM CHLORIDE 0.9 % IV SOLN
200.0000 mg | Freq: Once | INTRAVENOUS | Status: DC
  Filled 2021-10-08: qty 10

## 2021-10-11 ENCOUNTER — Other Ambulatory Visit (HOSPITAL_COMMUNITY)
Admission: RE | Admit: 2021-10-11 | Discharge: 2021-10-11 | Disposition: A | Discharge status: Home / Self Care | Payer: Medicaid Other | Source: Ambulatory Visit | Attending: Obstetrics & Gynecology | Admitting: Obstetrics & Gynecology

## 2021-10-11 ENCOUNTER — Other Ambulatory Visit: Payer: Self-pay | Admitting: Obstetrics & Gynecology

## 2021-10-11 ENCOUNTER — Ambulatory Visit: Payer: Medicaid Other | Admitting: *Deleted

## 2021-10-11 ENCOUNTER — Ambulatory Visit (INDEPENDENT_AMBULATORY_CARE_PROVIDER_SITE_OTHER): Payer: Medicaid Other | Admitting: Obstetrics & Gynecology

## 2021-10-11 ENCOUNTER — Encounter (HOSPITAL_BASED_OUTPATIENT_CLINIC_OR_DEPARTMENT_OTHER): Payer: Self-pay | Admitting: Obstetrics & Gynecology

## 2021-10-11 VITALS — BP 110/61 | HR 88 | Wt 158.4 lb

## 2021-10-11 DIAGNOSIS — Z3A35 35 weeks gestation of pregnancy: Secondary | ICD-10-CM | POA: Insufficient documentation

## 2021-10-11 DIAGNOSIS — Z1231 Encounter for screening mammogram for malignant neoplasm of breast: Secondary | ICD-10-CM | POA: Diagnosis not present

## 2021-10-11 DIAGNOSIS — O365930 Maternal care for other known or suspected poor fetal growth, third trimester, not applicable or unspecified: Secondary | ICD-10-CM

## 2021-10-11 DIAGNOSIS — O09893 Supervision of other high risk pregnancies, third trimester: Secondary | ICD-10-CM

## 2021-10-11 DIAGNOSIS — F17200 Nicotine dependence, unspecified, uncomplicated: Secondary | ICD-10-CM

## 2021-10-11 DIAGNOSIS — R87612 Low grade squamous intraepithelial lesion on cytologic smear of cervix (LGSIL): Secondary | ICD-10-CM

## 2021-10-11 DIAGNOSIS — O0990 Supervision of high risk pregnancy, unspecified, unspecified trimester: Secondary | ICD-10-CM

## 2021-10-11 DIAGNOSIS — O09899 Supervision of other high risk pregnancies, unspecified trimester: Secondary | ICD-10-CM

## 2021-10-11 DIAGNOSIS — Z8619 Personal history of other infectious and parasitic diseases: Secondary | ICD-10-CM

## 2021-10-11 DIAGNOSIS — D563 Thalassemia minor: Secondary | ICD-10-CM

## 2021-10-11 DIAGNOSIS — O0993 Supervision of high risk pregnancy, unspecified, third trimester: Secondary | ICD-10-CM

## 2021-10-11 DIAGNOSIS — G40909 Epilepsy, unspecified, not intractable, without status epilepticus: Secondary | ICD-10-CM

## 2021-10-11 DIAGNOSIS — Z2839 Other underimmunization status: Secondary | ICD-10-CM

## 2021-10-11 DIAGNOSIS — O99013 Anemia complicating pregnancy, third trimester: Secondary | ICD-10-CM

## 2021-10-11 MED ORDER — DIPHENHYDRAMINE HCL 25 MG PO CAPS: 25.0000 mg | ORAL_CAPSULE | Freq: Once | ORAL | Status: DC

## 2021-10-11 MED ORDER — ACETAMINOPHEN 325 MG PO TABS: 650.0000 mg | ORAL_TABLET | Freq: Once | ORAL | Status: DC

## 2021-10-11 MED ORDER — SODIUM CHLORIDE 0.9 % IV SOLN
200.0000 mg | Freq: Once | INTRAVENOUS | Status: DC
  Filled 2021-10-11: qty 10
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
## 2021-10-12 LAB — CERVICOVAGINAL ANCILLARY ONLY
Chlamydia: NEGATIVE
Comment: NEGATIVE
Comment: NORMAL
Neisseria Gonorrhea: NEGATIVE

## 2021-10-13 ENCOUNTER — Ambulatory Visit: Payer: Medicaid Other | Admitting: *Deleted

## 2021-10-13 ENCOUNTER — Encounter: Payer: Self-pay | Admitting: Obstetrics & Gynecology

## 2021-10-13 ENCOUNTER — Other Ambulatory Visit: Payer: Self-pay | Admitting: Obstetrics and Gynecology

## 2021-10-13 ENCOUNTER — Ambulatory Visit: Payer: Medicaid Other

## 2021-10-13 ENCOUNTER — Ambulatory Visit: Payer: Medicaid Other | Attending: Obstetrics and Gynecology

## 2021-10-13 ENCOUNTER — Encounter (HOSPITAL_BASED_OUTPATIENT_CLINIC_OR_DEPARTMENT_OTHER): Payer: Self-pay | Admitting: Obstetrics & Gynecology

## 2021-10-13 VITALS — BP 104/59 | HR 78

## 2021-10-13 DIAGNOSIS — T1490XD Injury, unspecified, subsequent encounter: Secondary | ICD-10-CM

## 2021-10-13 DIAGNOSIS — O9A213 Injury, poisoning and certain other consequences of external causes complicating pregnancy, third trimester: Secondary | ICD-10-CM

## 2021-10-13 DIAGNOSIS — D561 Beta thalassemia: Secondary | ICD-10-CM | POA: Diagnosis not present

## 2021-10-13 DIAGNOSIS — O283 Abnormal ultrasonic finding on antenatal screening of mother: Secondary | ICD-10-CM | POA: Diagnosis present

## 2021-10-13 DIAGNOSIS — D563 Thalassemia minor: Secondary | ICD-10-CM | POA: Diagnosis present

## 2021-10-13 DIAGNOSIS — O0990 Supervision of high risk pregnancy, unspecified, unspecified trimester: Secondary | ICD-10-CM | POA: Diagnosis present

## 2021-10-13 DIAGNOSIS — O99333 Smoking (tobacco) complicating pregnancy, third trimester: Secondary | ICD-10-CM | POA: Diagnosis not present

## 2021-10-13 DIAGNOSIS — F17210 Nicotine dependence, cigarettes, uncomplicated: Secondary | ICD-10-CM

## 2021-10-13 DIAGNOSIS — O99353 Diseases of the nervous system complicating pregnancy, third trimester: Secondary | ICD-10-CM

## 2021-10-13 DIAGNOSIS — O285 Abnormal chromosomal and genetic finding on antenatal screening of mother: Secondary | ICD-10-CM | POA: Diagnosis not present

## 2021-10-13 DIAGNOSIS — Z3A35 35 weeks gestation of pregnancy: Secondary | ICD-10-CM

## 2021-10-13 DIAGNOSIS — G40909 Epilepsy, unspecified, not intractable, without status epilepticus: Secondary | ICD-10-CM

## 2021-10-13 DIAGNOSIS — O365931 Maternal care for other known or suspected poor fetal growth, third trimester, fetus 1: Secondary | ICD-10-CM

## 2021-10-13 DIAGNOSIS — O365930 Maternal care for other known or suspected poor fetal growth, third trimester, not applicable or unspecified: Secondary | ICD-10-CM | POA: Diagnosis not present

## 2021-10-13 LAB — STREP GP B NAA: Strep Gp B NAA: NEGATIVE

## 2021-10-13 MED ORDER — SODIUM CHLORIDE 0.9 % IV SOLN
200.0000 mg | INTRAVENOUS | Status: AC
  Filled 2021-10-13: qty 10

## 2021-10-13 MED ORDER — DIPHENHYDRAMINE HCL 25 MG PO CAPS: 25.0000 mg | ORAL_CAPSULE | Freq: Once | ORAL | Status: DC

## 2021-10-13 MED ORDER — ACETAMINOPHEN 325 MG PO TABS: 650.0000 mg | ORAL_TABLET | Freq: Once | ORAL | Status: DC

## 2021-10-13 NOTE — Procedures (Signed)
Alexis [name] 1995/08/03 [redacted]w[redacted]d  Fetus A Non-Stress Test Interpretation for 10/13/21  Indication: Unsatisfactory BPP  Fetal Heart Rate A Mode: External Baseline Rate (A): 130 bpm Variability: Moderate Accelerations: 15 x 15 Decelerations: None Multiple birth?: No  Uterine Activity Mode: Palpation, Toco Contraction Frequency (min): 1 uc Contraction Duration (sec): 70 Contraction Quality: Mild Resting Tone Palpated: Relaxed Resting Time: Adequate  Interpretation (Fetal Testing) Nonstress Test Interpretation: Reactive Overall Impression: Reassuring for gestational age Comments: Dr. Darra Lis reviewed tracing

## 2021-10-14 ENCOUNTER — Encounter (HOSPITAL_COMMUNITY): Payer: Self-pay | Admitting: Obstetrics and Gynecology

## 2021-10-14 ENCOUNTER — Encounter (HOSPITAL_BASED_OUTPATIENT_CLINIC_OR_DEPARTMENT_OTHER): Payer: Medicaid Other | Admitting: Obstetrics & Gynecology

## 2021-10-14 ENCOUNTER — Telehealth (HOSPITAL_BASED_OUTPATIENT_CLINIC_OR_DEPARTMENT_OTHER): Payer: Self-pay | Admitting: *Deleted

## 2021-10-14 ENCOUNTER — Inpatient Hospital Stay (HOSPITAL_COMMUNITY)
Admission: AD | Admit: 2021-10-14 | Discharge: 2021-10-14 | Disposition: A | Discharge status: Home / Self Care | Payer: Medicaid Other | Attending: Obstetrics and Gynecology | Admitting: Obstetrics and Gynecology

## 2021-10-14 DIAGNOSIS — R102 Pelvic and perineal pain: Secondary | ICD-10-CM | POA: Diagnosis not present

## 2021-10-14 DIAGNOSIS — F17210 Nicotine dependence, cigarettes, uncomplicated: Secondary | ICD-10-CM | POA: Insufficient documentation

## 2021-10-14 DIAGNOSIS — D563 Thalassemia minor: Secondary | ICD-10-CM

## 2021-10-14 DIAGNOSIS — O98513 Other viral diseases complicating pregnancy, third trimester: Secondary | ICD-10-CM | POA: Insufficient documentation

## 2021-10-14 DIAGNOSIS — O99323 Drug use complicating pregnancy, third trimester: Secondary | ICD-10-CM | POA: Insufficient documentation

## 2021-10-14 DIAGNOSIS — O0990 Supervision of high risk pregnancy, unspecified, unspecified trimester: Secondary | ICD-10-CM

## 2021-10-14 DIAGNOSIS — O479 False labor, unspecified: Secondary | ICD-10-CM

## 2021-10-14 DIAGNOSIS — Z3A35 35 weeks gestation of pregnancy: Secondary | ICD-10-CM | POA: Insufficient documentation

## 2021-10-14 DIAGNOSIS — O26893 Other specified pregnancy related conditions, third trimester: Secondary | ICD-10-CM | POA: Insufficient documentation

## 2021-10-14 DIAGNOSIS — O99333 Smoking (tobacco) complicating pregnancy, third trimester: Secondary | ICD-10-CM | POA: Diagnosis not present

## 2021-10-14 LAB — POCT FERN TEST: POCT Fern Test: NEGATIVE

## 2021-10-14 MED ORDER — LACTATED RINGERS IV BOLUS
1000.0000 mL | Freq: Once | INTRAVENOUS | Status: AC
Start: 2021-10-14 — End: 2021-10-14
  Administered 2021-10-14: 1000 mL via INTRAVENOUS

## 2021-10-14 MED ORDER — CYCLOBENZAPRINE HCL 5 MG PO TABS
10.0000 mg | ORAL_TABLET | Freq: Once | ORAL | Status: AC
  Administered 2021-10-14: 10 mg via ORAL
  Filled 2021-10-14: qty 2

## 2021-10-14 MED ORDER — ACETAMINOPHEN 500 MG PO TABS
1000.0000 mg | ORAL_TABLET | Freq: Once | ORAL | Status: AC
Start: 2021-10-14 — End: 2021-10-14
  Administered 2021-10-14: 1000 mg via ORAL
  Filled 2021-10-14: qty 2

## 2021-10-14 NOTE — Telephone Encounter (Signed)
Called pt to advise that she try to come into the office now as Dr. Hyacinth Meeker has several appointments in the late afternoon.

## 2021-10-14 NOTE — MAU Note (Addendum)
...  Alexis [name] is a 26 y.o. at [redacted]w[redacted]d here in MAU reporting: Gush of fluids at 1500 after returning to her bed from using the restroom. She reports she went to lay down and felt fluid come out. Has not felt any leaking since then. She reports almost immediately after this occurred she began having CTX that are every 4-5 minutes. She reports for the past "couple of days" she has had constant right lower abdominal pain that is sharp and worse with movement. She reports she is now feeling a constant pelvic pressure. Has not felt any fetal movement since being picked up by EMS around 20 minutes ago. Denies VB.   Patient reports she had a walk in appointment scheduled for today with Dr. Hyacinth Meeker to come in before 4 pm but reports she was unable to get there.  Onset of complaint: 1500 today Pain score:  10/10 pelvis 9/10 CTX    FHT: 135 initial external

## 2021-10-14 NOTE — Telephone Encounter (Signed)
Pt called stating that she thinks that her water just broke. She had a large gush of fluid and is having contractions. She has not felt any fetal movement since the gush of fluid. Advised pt that she needed to go to maternity assessment unit for evaluation. Pt states that she is headed there now.

## 2021-10-14 NOTE — MAU Provider Note (Signed)
History     CSN: [id number]  Arrival date and time: 10/14/21 1548   Event Date/Time   First Provider Initiated Contact with Patient 10/14/21 1604      Chief Complaint  Patient presents with   Abdominal Pain   Pelvic Pain   Contractions   Rupture of Membranes   HPI Alexis [name] is a 26 y.o. G3P2002 at [redacted]w[redacted]d who presents to MAU via EMS with reports of leaking fluid. Patient reports at 2:40pm she went the bathroom and while she was getting back into bed she felt a small gush of fluid. Fluid was clear. She is not wearing a pad and has not had any more gushes of fluid. She reports contractions every 3-4 mins since the gush occurred. She denies vaginal bleeding. Endorses active fetal movement. Cervix was 3cm on last exam.  Patient goes to [facility] and reports she had an appointment scheduled today at 4pm, but came here instead.   OB History     Gravida  3   Para  2   Term  2   Preterm      AB      Living  2      SAB      IAB      Ectopic      Multiple  0   Live Births  1           Past Medical History:  Diagnosis Date   Beta thalassemia trait    Family history of breast cancer in mother    Gonorrhea 12/2020   treated   Marijuana abuse    Seizures (HCC)    last seizure in 2021   Shingles outbreak 10/2018   Trichomonas infection 02/2021    Past Surgical History:  Procedure Laterality Date   NO PAST SURGERIES      Family History  Problem Relation Age of Onset   Cancer Mother 32       breast   Heart disease Father    Heart disease Paternal Grandmother    Heart disease Paternal Grandfather     Social History   Tobacco Use   Smoking status: Every Day    Packs/day: 0.25    Types: Cigarettes    Passive exposure: Never   Smokeless tobacco: Never   Tobacco comments:    5=6 cigs a day  Vaping Use   Vaping Use: Never used  Substance Use Topics   Alcohol use: No   Drug use: Not Currently    Types: Marijuana    Comment: at times     Allergies: No Known Allergies  No medications prior to admission.    Review of Systems  Constitutional: Negative.   Respiratory: Negative.    Cardiovascular: Negative.   Gastrointestinal:  Positive for abdominal pain (contractions).  Genitourinary:  Positive for vaginal discharge. Negative for vaginal bleeding.  Musculoskeletal: Negative.   Neurological: Negative.    Physical Exam   Blood pressure 105/68, pulse 68, temperature 98.3 F (36.8 C), temperature source Oral, resp. rate 17, last menstrual period 02/05/2021, SpO2 99 %.  Physical Exam Vitals and nursing note reviewed. Exam conducted with a chaperone present.  Constitutional:      General: She is not in acute distress. Cardiovascular:     Rate and Rhythm: Normal rate.  Pulmonary:     Effort: Pulmonary effort is normal.  Abdominal:     Palpations: Abdomen is soft.     Tenderness: There is no abdominal tenderness.  Comments: Gravid   Genitourinary:    Comments: Normal external female genitalia, vaginal walls pink with rugae, scant creamy white discharge, no pooling of amniotic fluid, no vaginal bleeding, cervix visually 3cm without lesions/masses Skin:    General: Skin is warm and dry.  Neurological:     General: No focal deficit present.     Mental Status: She is alert and oriented to person, place, and time.  Psychiatric:        Mood and Affect: Mood normal.        Behavior: Behavior normal.   Dilation: 3 Effacement (%): Thick Cervical Position: Posterior Station: Ballotable Presentation: Vertex Exam by:: Camelia Eng, CNM  NST FHR: 120 bpm, moderate variability, +15x15 accels, no decels Toco: irregular with ui  Fern slide: negative  MAU Course  Procedures  MDM SSE without evidence of pooling of amniotic fluid. Scant amount physiologic discharge. Fern slide negative. Cervix unchanged from previous exam. Patient uncomfortable with contractions and ongoing pelvic pain. She and significant  other strongly express that it was discussed at a prenatal visit that if she came in with contractions that she would be admitted for delivery. Both patient and her significant other express frustration in regards to timing of delivery as they feel they have been given conflicting information. I reviewed with patient that if she were in labor and cervix was to dilate more or if water was broken, that admission would be warranted, but given that her membranes are intact and cervix is unchanged, admission is not warranted as she is preterm. I discussed IV hydration and PO Tylenol and Flexeril to which she agrees to take.   On reassessment, contractions have spaced. Cervix unchanged. At this time I have a low suspicion for active labor. Patient encouraged to go home and PO hydrate and rest. Strict return precautions reviewed.   Assessment and Plan  [redacted] weeks gestation of pregnancy Braxton hicks contractions  - Discharge home in stable condition - Strict return precautions reviewed. Return to MAU sooner or as needed for new/worsening symptoms - Keep OB appointment as scheduled on 8/28   Brand Males, CNM 10/14/2021, 7:28 PM

## 2021-10-15 ENCOUNTER — Ambulatory Visit: Payer: Self-pay

## 2021-10-15 ENCOUNTER — Ambulatory Visit (INDEPENDENT_AMBULATORY_CARE_PROVIDER_SITE_OTHER): Payer: Medicaid Other | Admitting: Obstetrics & Gynecology

## 2021-10-15 VITALS — BP 103/76 | HR 81 | Wt 159.8 lb

## 2021-10-15 DIAGNOSIS — O09899 Supervision of other high risk pregnancies, unspecified trimester: Secondary | ICD-10-CM

## 2021-10-15 DIAGNOSIS — O365930 Maternal care for other known or suspected poor fetal growth, third trimester, not applicable or unspecified: Secondary | ICD-10-CM

## 2021-10-15 DIAGNOSIS — F17200 Nicotine dependence, unspecified, uncomplicated: Secondary | ICD-10-CM

## 2021-10-15 DIAGNOSIS — O0990 Supervision of high risk pregnancy, unspecified, unspecified trimester: Secondary | ICD-10-CM

## 2021-10-15 DIAGNOSIS — O26899 Other specified pregnancy related conditions, unspecified trimester: Secondary | ICD-10-CM

## 2021-10-15 DIAGNOSIS — O99013 Anemia complicating pregnancy, third trimester: Secondary | ICD-10-CM

## 2021-10-15 DIAGNOSIS — Z2839 Other underimmunization status: Secondary | ICD-10-CM

## 2021-10-15 DIAGNOSIS — G40909 Epilepsy, unspecified, not intractable, without status epilepticus: Secondary | ICD-10-CM

## 2021-10-15 DIAGNOSIS — R109 Unspecified abdominal pain: Secondary | ICD-10-CM

## 2021-10-15 DIAGNOSIS — Z8619 Personal history of other infectious and parasitic diseases: Secondary | ICD-10-CM

## 2021-10-15 DIAGNOSIS — Z3A36 36 weeks gestation of pregnancy: Secondary | ICD-10-CM

## 2021-10-15 MED ORDER — ACETAMINOPHEN 325 MG PO TABS: 650.0000 mg | ORAL_TABLET | Freq: Once | ORAL | Status: DC

## 2021-10-15 MED ORDER — SODIUM CHLORIDE 0.9 % IV SOLN
200.0000 mg | INTRAVENOUS | Status: AC
  Filled 2021-10-15 (×2): qty 10

## 2021-10-15 MED ORDER — DIPHENHYDRAMINE HCL 25 MG PO CAPS: 25.0000 mg | ORAL_CAPSULE | Freq: Once | ORAL | Status: DC
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
## 2021-10-18 ENCOUNTER — Encounter (HOSPITAL_BASED_OUTPATIENT_CLINIC_OR_DEPARTMENT_OTHER): Payer: Self-pay | Admitting: *Deleted

## 2021-10-18 ENCOUNTER — Encounter (HOSPITAL_BASED_OUTPATIENT_CLINIC_OR_DEPARTMENT_OTHER): Payer: Medicaid Other | Admitting: Obstetrics & Gynecology

## 2021-10-20 ENCOUNTER — Ambulatory Visit: Payer: Medicaid Other | Attending: Maternal & Fetal Medicine

## 2021-10-20 ENCOUNTER — Ambulatory Visit: Payer: Medicaid Other | Admitting: *Deleted

## 2021-10-20 ENCOUNTER — Encounter (HOSPITAL_BASED_OUTPATIENT_CLINIC_OR_DEPARTMENT_OTHER): Payer: Medicaid Other | Admitting: Obstetrics & Gynecology

## 2021-10-20 VITALS — BP 120/59 | HR 103

## 2021-10-20 DIAGNOSIS — D563 Thalassemia minor: Secondary | ICD-10-CM | POA: Diagnosis present

## 2021-10-20 DIAGNOSIS — G40909 Epilepsy, unspecified, not intractable, without status epilepticus: Secondary | ICD-10-CM

## 2021-10-20 DIAGNOSIS — O99333 Smoking (tobacco) complicating pregnancy, third trimester: Secondary | ICD-10-CM

## 2021-10-20 DIAGNOSIS — F17210 Nicotine dependence, cigarettes, uncomplicated: Secondary | ICD-10-CM | POA: Diagnosis not present

## 2021-10-20 DIAGNOSIS — O0990 Supervision of high risk pregnancy, unspecified, unspecified trimester: Secondary | ICD-10-CM

## 2021-10-20 DIAGNOSIS — O285 Abnormal chromosomal and genetic finding on antenatal screening of mother: Secondary | ICD-10-CM

## 2021-10-20 DIAGNOSIS — O9A219 Injury, poisoning and certain other consequences of external causes complicating pregnancy, unspecified trimester: Secondary | ICD-10-CM

## 2021-10-20 DIAGNOSIS — O99353 Diseases of the nervous system complicating pregnancy, third trimester: Secondary | ICD-10-CM

## 2021-10-20 DIAGNOSIS — O365930 Maternal care for other known or suspected poor fetal growth, third trimester, not applicable or unspecified: Secondary | ICD-10-CM | POA: Diagnosis not present

## 2021-10-20 DIAGNOSIS — T1490XD Injury, unspecified, subsequent encounter: Secondary | ICD-10-CM

## 2021-10-20 DIAGNOSIS — Z3A36 36 weeks gestation of pregnancy: Secondary | ICD-10-CM

## 2021-10-21 ENCOUNTER — Encounter (HOSPITAL_BASED_OUTPATIENT_CLINIC_OR_DEPARTMENT_OTHER): Payer: Medicaid Other | Admitting: Obstetrics & Gynecology

## 2021-10-21 ENCOUNTER — Encounter (HOSPITAL_COMMUNITY): Payer: Self-pay

## 2021-10-21 ENCOUNTER — Other Ambulatory Visit (HOSPITAL_BASED_OUTPATIENT_CLINIC_OR_DEPARTMENT_OTHER): Payer: Self-pay

## 2021-10-21 ENCOUNTER — Ambulatory Visit (INDEPENDENT_AMBULATORY_CARE_PROVIDER_SITE_OTHER): Payer: Medicaid Other | Admitting: Obstetrics & Gynecology

## 2021-10-21 ENCOUNTER — Telehealth (HOSPITAL_COMMUNITY): Payer: Self-pay | Admitting: *Deleted

## 2021-10-21 VITALS — BP 104/71 | HR 86 | Wt 159.4 lb

## 2021-10-21 DIAGNOSIS — O365930 Maternal care for other known or suspected poor fetal growth, third trimester, not applicable or unspecified: Secondary | ICD-10-CM

## 2021-10-21 DIAGNOSIS — Z3A36 36 weeks gestation of pregnancy: Secondary | ICD-10-CM

## 2021-10-21 DIAGNOSIS — O99013 Anemia complicating pregnancy, third trimester: Secondary | ICD-10-CM

## 2021-10-21 DIAGNOSIS — Z2839 Other underimmunization status: Secondary | ICD-10-CM

## 2021-10-21 DIAGNOSIS — D563 Thalassemia minor: Secondary | ICD-10-CM

## 2021-10-21 DIAGNOSIS — O0990 Supervision of high risk pregnancy, unspecified, unspecified trimester: Secondary | ICD-10-CM

## 2021-10-21 DIAGNOSIS — O09899 Supervision of other high risk pregnancies, unspecified trimester: Secondary | ICD-10-CM

## 2021-10-21 DIAGNOSIS — F17200 Nicotine dependence, unspecified, uncomplicated: Secondary | ICD-10-CM

## 2021-10-21 DIAGNOSIS — Z3A35 35 weeks gestation of pregnancy: Secondary | ICD-10-CM

## 2021-10-21 DIAGNOSIS — R87612 Low grade squamous intraepithelial lesion on cytologic smear of cervix (LGSIL): Secondary | ICD-10-CM

## 2021-10-21 DIAGNOSIS — Z8619 Personal history of other infectious and parasitic diseases: Secondary | ICD-10-CM

## 2021-10-21 DIAGNOSIS — G40909 Epilepsy, unspecified, not intractable, without status epilepticus: Secondary | ICD-10-CM
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
## 2021-10-21 NOTE — Telephone Encounter (Signed)
Preadmission screen  

## 2021-10-22 ENCOUNTER — Encounter (HOSPITAL_COMMUNITY): Payer: Self-pay | Admitting: *Deleted

## 2021-10-22 ENCOUNTER — Telehealth (HOSPITAL_COMMUNITY): Payer: Self-pay | Admitting: *Deleted

## 2021-10-22 NOTE — Telephone Encounter (Signed)
Preadmission screen  

## 2021-10-23 ENCOUNTER — Other Ambulatory Visit: Payer: Self-pay | Admitting: Advanced Practice Midwife

## 2021-10-23 ENCOUNTER — Other Ambulatory Visit (HOSPITAL_BASED_OUTPATIENT_CLINIC_OR_DEPARTMENT_OTHER): Payer: Self-pay | Admitting: Obstetrics & Gynecology

## 2021-10-23 DIAGNOSIS — Z3490 Encounter for supervision of normal pregnancy, unspecified, unspecified trimester: Secondary | ICD-10-CM

## 2021-10-23 DIAGNOSIS — O365931 Maternal care for other known or suspected poor fetal growth, third trimester, fetus 1: Secondary | ICD-10-CM

## 2021-10-25 ENCOUNTER — Encounter (HOSPITAL_COMMUNITY): Payer: Self-pay | Admitting: Obstetrics & Gynecology

## 2021-10-25 ENCOUNTER — Inpatient Hospital Stay (HOSPITAL_COMMUNITY)
Admission: AD | Admit: 2021-10-25 | Discharge: 2021-10-25 | Disposition: A | Discharge status: Home / Self Care | Payer: Medicaid Other | Attending: Obstetrics & Gynecology | Admitting: Obstetrics & Gynecology

## 2021-10-25 ENCOUNTER — Other Ambulatory Visit: Payer: Self-pay

## 2021-10-25 DIAGNOSIS — O368130 Decreased fetal movements, third trimester, not applicable or unspecified: Secondary | ICD-10-CM | POA: Diagnosis not present

## 2021-10-25 DIAGNOSIS — Z3A37 37 weeks gestation of pregnancy: Secondary | ICD-10-CM | POA: Insufficient documentation

## 2021-10-25 DIAGNOSIS — O471 False labor at or after 37 completed weeks of gestation: Secondary | ICD-10-CM | POA: Diagnosis not present

## 2021-10-25 DIAGNOSIS — D563 Thalassemia minor: Secondary | ICD-10-CM

## 2021-10-25 DIAGNOSIS — Z3689 Encounter for other specified antenatal screening: Secondary | ICD-10-CM

## 2021-10-25 DIAGNOSIS — O0990 Supervision of high risk pregnancy, unspecified, unspecified trimester: Secondary | ICD-10-CM

## 2021-10-25 NOTE — MAU Note (Signed)
Alexis [name] is a 26 y.o. at [redacted]w[redacted]d here in MAU reporting: DFM started yesterday at noon. Denies pain, bleeding, or LOF. Feeling some pressure.  Onset of complaint: yesterday  Pain score: 0/10  Vitals:   10/25/21 1045  BP: (!) 94/53  Pulse: 98  Resp: 16  Temp: 97.9 F (36.6 C)  SpO2: 98%     FHT:137  Lab orders placed from triage: none

## 2021-10-25 NOTE — MAU Provider Note (Signed)
History     CSN: [id number]  Arrival date and time: 10/25/21 1013   None     Chief Complaint  Patient presents with   Decreased Fetal Movement   HPI Alexis [name] is a 26 y.o. G3P2002 at [redacted]w[redacted]d who presents to MAU for decreased fetal movement. Patient reports decreased fetal movement since yesterday evening. Since arrival to MAU she is reporting normal fetal movement. She reports some braxton hicks contractions, but not painful. No vaginal bleeding or leaking fluid.   Patient receives [facility], next appointment is tomorrow morning. She is also scheduled for IOL on Friday 9/4.  OB History     Gravida  3   Para  2   Term  2   Preterm      AB      Living  2      SAB      IAB      Ectopic      Multiple  0   Live Births  2           Past Medical History:  Diagnosis Date   Beta thalassemia trait    Family history of breast cancer in mother    Gonorrhea 12/2020   treated   Marijuana abuse    Seizures (HCC)    last seizure in 2021   Shingles outbreak 10/2018   Trichomonas infection 02/2021    Past Surgical History:  Procedure Laterality Date   NO PAST SURGERIES      Family History  Problem Relation Age of Onset   Cancer Mother 22       breast   Heart disease Father    Heart disease Paternal Grandmother    Heart disease Paternal Grandfather     Social History   Tobacco Use   Smoking status: Every Day    Packs/day: 0.25    Types: Cigarettes    Passive exposure: Never   Smokeless tobacco: Never   Tobacco comments:    5=6 cigs a day  Vaping Use   Vaping Use: Never used  Substance Use Topics   Alcohol use: No   Drug use: Not Currently    Types: Marijuana    Comment: last used a couple weeks ago    Allergies: No Known Allergies  No medications prior to admission.   Review of Systems  Constitutional: Negative.   Respiratory: Negative.    Cardiovascular: Negative.   Gastrointestinal: Negative.   Genitourinary: Negative.    Musculoskeletal: Negative.   Neurological: Negative.    Physical Exam  Patient Vitals for the past 24 hrs:  BP Temp Temp src Pulse Resp SpO2 Height Weight  10/25/21 1129 92/69 -- -- 82 -- -- -- --  10/25/21 1045 (!) 94/53 97.9 F (36.6 C) Oral 98 16 98 % -- --  10/25/21 1038 -- -- -- -- -- -- 5\' 5"  (1.651 m) 73.6 kg   Physical Exam Vitals and nursing note reviewed. Exam conducted with a chaperone present.  Constitutional:      General: She is not in acute distress. Eyes:     Extraocular Movements: Extraocular movements intact.     Pupils: Pupils are equal, round, and reactive to light.  Cardiovascular:     Rate and Rhythm: Normal rate.  Pulmonary:     Effort: Pulmonary effort is normal.  Abdominal:     Palpations: Abdomen is soft.     Tenderness: There is no abdominal tenderness.     Comments: Gravid  Musculoskeletal:        General: Normal range of motion.     Cervical back: Normal range of motion.  Skin:    General: Skin is warm and dry.  Neurological:     General: No focal deficit present.     Mental Status: She is alert and oriented to person, place, and time.  Psychiatric:        Mood and Affect: Mood normal.        Behavior: Behavior normal.        Judgment: Judgment normal.    Dilation: 3.5 Effacement (%): 50 Station: Ballotable Exam by:: Inza Mikrut,CNM  NST FHR: 130 bpm, moderate variability, +15x15 accels, no decels Toco: quiet  MAU Course  Procedures  MDM NST reactive and reassuring. Patient reporting normal fetal movement since arrival. Pushed NST clicker >10 times within 20 minute time frame. Patient requesting cervical exam which is unchanged from previous exam.   Patient has OB appointment tomorrow and instructed to keep appointment as scheduled.   Assessment and Plan  [redacted] weeks gestation of pregnancy Decreased fetal movement NST reactive  - Discharge home in stable condition - Keep OB appointment tomorrow - Strict return  precautions. Return to MAU sooner or as needed for new/worsening symptoms   Brand Males, CNM 10/25/2021, 12:16 PM

## 2021-10-26 ENCOUNTER — Ambulatory Visit (INDEPENDENT_AMBULATORY_CARE_PROVIDER_SITE_OTHER): Payer: Medicaid Other

## 2021-10-26 ENCOUNTER — Ambulatory Visit (INDEPENDENT_AMBULATORY_CARE_PROVIDER_SITE_OTHER): Payer: Medicaid Other | Admitting: Advanced Practice Midwife

## 2021-10-26 VITALS — BP 117/87 | HR 91

## 2021-10-26 VITALS — BP 103/67 | HR 72 | Temp 98.6°F | Resp 18 | Ht 65.0 in | Wt 164.0 lb

## 2021-10-26 DIAGNOSIS — O365931 Maternal care for other known or suspected poor fetal growth, third trimester, fetus 1: Secondary | ICD-10-CM

## 2021-10-26 DIAGNOSIS — Z3A37 37 weeks gestation of pregnancy: Secondary | ICD-10-CM | POA: Diagnosis not present

## 2021-10-26 DIAGNOSIS — D508 Other iron deficiency anemias: Secondary | ICD-10-CM

## 2021-10-26 DIAGNOSIS — O0990 Supervision of high risk pregnancy, unspecified, unspecified trimester: Secondary | ICD-10-CM

## 2021-10-26 DIAGNOSIS — O99013 Anemia complicating pregnancy, third trimester: Secondary | ICD-10-CM | POA: Diagnosis not present

## 2021-10-26 DIAGNOSIS — R87612 Low grade squamous intraepithelial lesion on cytologic smear of cervix (LGSIL): Secondary | ICD-10-CM

## 2021-10-26 MED ORDER — SODIUM CHLORIDE 0.9 % IV SOLN
200.0000 mg | Freq: Once | INTRAVENOUS | Status: AC
  Administered 2021-10-26: 200 mg via INTRAVENOUS
  Filled 2021-10-26: qty 10

## 2021-10-26 MED ORDER — ACETAMINOPHEN 325 MG PO TABS: 650.0000 mg | ORAL_TABLET | Freq: Once | ORAL | Status: DC

## 2021-10-26 MED ORDER — DIPHENHYDRAMINE HCL 25 MG PO CAPS: 25.0000 mg | ORAL_CAPSULE | Freq: Once | ORAL | Status: DC
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
## 2021-10-26 NOTE — Progress Notes (Signed)
Diagnosis: Iron Deficiency Anemia  Provider:  Chilton Greathouse MD  Procedure: Infusion  IV Type: Peripheral, IV Location: R Antecubital  Venofer (Iron Sucrose), Dose: 200 mg  Infusion Start Time: 1420  Infusion Stop Time: 1435  Post Infusion IV Care: Peripheral IV Discontinued  Discharge: Condition: Good, Destination: Home . AVS provided to patient.   Performed by:  Adriana Mccallum, RN

## 2021-10-28 ENCOUNTER — Encounter (HOSPITAL_BASED_OUTPATIENT_CLINIC_OR_DEPARTMENT_OTHER): Payer: Medicaid Other | Admitting: Obstetrics & Gynecology

## 2021-10-28 ENCOUNTER — Ambulatory Visit: Payer: Medicaid Other | Admitting: *Deleted

## 2021-10-28 ENCOUNTER — Ambulatory Visit (INDEPENDENT_AMBULATORY_CARE_PROVIDER_SITE_OTHER): Payer: Medicaid Other | Admitting: *Deleted

## 2021-10-28 ENCOUNTER — Ambulatory Visit (HOSPITAL_BASED_OUTPATIENT_CLINIC_OR_DEPARTMENT_OTHER): Payer: Medicaid Other

## 2021-10-28 VITALS — BP 112/66 | HR 117

## 2021-10-28 VITALS — BP 112/67 | HR 90 | Temp 97.7°F | Resp 18 | Ht 65.0 in | Wt 159.2 lb

## 2021-10-28 DIAGNOSIS — O99330 Smoking (tobacco) complicating pregnancy, unspecified trimester: Secondary | ICD-10-CM

## 2021-10-28 DIAGNOSIS — O0990 Supervision of high risk pregnancy, unspecified, unspecified trimester: Secondary | ICD-10-CM | POA: Insufficient documentation

## 2021-10-28 DIAGNOSIS — O99350 Diseases of the nervous system complicating pregnancy, unspecified trimester: Secondary | ICD-10-CM

## 2021-10-28 DIAGNOSIS — F17210 Nicotine dependence, cigarettes, uncomplicated: Secondary | ICD-10-CM

## 2021-10-28 DIAGNOSIS — D563 Thalassemia minor: Secondary | ICD-10-CM

## 2021-10-28 DIAGNOSIS — O99013 Anemia complicating pregnancy, third trimester: Secondary | ICD-10-CM

## 2021-10-28 DIAGNOSIS — D508 Other iron deficiency anemias: Secondary | ICD-10-CM

## 2021-10-28 DIAGNOSIS — G40909 Epilepsy, unspecified, not intractable, without status epilepticus: Secondary | ICD-10-CM

## 2021-10-28 DIAGNOSIS — O365930 Maternal care for other known or suspected poor fetal growth, third trimester, not applicable or unspecified: Secondary | ICD-10-CM

## 2021-10-28 DIAGNOSIS — Z3A37 37 weeks gestation of pregnancy: Secondary | ICD-10-CM

## 2021-10-28 MED ORDER — SODIUM CHLORIDE 0.9 % IV SOLN
200.0000 mg | Freq: Once | INTRAVENOUS | Status: AC
  Administered 2021-10-28: 200 mg via INTRAVENOUS
  Filled 2021-10-28: qty 10

## 2021-10-28 MED ORDER — DIPHENHYDRAMINE HCL 25 MG PO CAPS: 25.0000 mg | ORAL_CAPSULE | Freq: Once | ORAL | Status: DC

## 2021-10-28 MED ORDER — ACETAMINOPHEN 325 MG PO TABS: 650.0000 mg | ORAL_TABLET | Freq: Once | ORAL | Status: DC

## 2021-10-28 NOTE — Progress Notes (Signed)
Diagnosis: Iron Deficiency Anemia  Provider:  Chilton Greathouse MD  Procedure: Infusion  IV Type: Peripheral, IV Location: R Forearm  Venofer (Iron Sucrose), Dose: 200 mg  Infusion Start Time: 0923 am  Infusion Stop Time: 0939 am  Post Infusion IV Care: Observation period completed and Peripheral IV Discontinued  Discharge: Condition: Good, Destination: Home . AVS provided to patient.   Performed by:  Forrest Moron, RN

## 2021-10-29 ENCOUNTER — Inpatient Hospital Stay (HOSPITAL_COMMUNITY)
Admission: AD | Admit: 2021-10-29 | Discharge: 2021-10-31 | DRG: 807 | Disposition: A | Discharge status: Home / Self Care | Payer: Medicaid Other | Attending: Obstetrics & Gynecology | Admitting: Obstetrics & Gynecology

## 2021-10-29 ENCOUNTER — Encounter (HOSPITAL_COMMUNITY): Payer: Self-pay | Admitting: Obstetrics & Gynecology

## 2021-10-29 ENCOUNTER — Inpatient Hospital Stay (HOSPITAL_COMMUNITY): Payer: Medicaid Other | Admitting: Anesthesiology

## 2021-10-29 ENCOUNTER — Inpatient Hospital Stay (HOSPITAL_COMMUNITY): Payer: Medicaid Other

## 2021-10-29 ENCOUNTER — Other Ambulatory Visit: Payer: Self-pay

## 2021-10-29 DIAGNOSIS — O365931 Maternal care for other known or suspected poor fetal growth, third trimester, fetus 1: Secondary | ICD-10-CM | POA: Diagnosis present

## 2021-10-29 DIAGNOSIS — O9902 Anemia complicating childbirth: Secondary | ICD-10-CM | POA: Diagnosis present

## 2021-10-29 DIAGNOSIS — Z23 Encounter for immunization: Secondary | ICD-10-CM | POA: Diagnosis not present

## 2021-10-29 DIAGNOSIS — O99334 Smoking (tobacco) complicating childbirth: Secondary | ICD-10-CM | POA: Diagnosis present

## 2021-10-29 DIAGNOSIS — O365930 Maternal care for other known or suspected poor fetal growth, third trimester, not applicable or unspecified: Secondary | ICD-10-CM | POA: Diagnosis present

## 2021-10-29 DIAGNOSIS — F17210 Nicotine dependence, cigarettes, uncomplicated: Secondary | ICD-10-CM | POA: Diagnosis present

## 2021-10-29 DIAGNOSIS — Z3A38 38 weeks gestation of pregnancy: Secondary | ICD-10-CM

## 2021-10-29 DIAGNOSIS — O99324 Drug use complicating childbirth: Secondary | ICD-10-CM | POA: Diagnosis not present

## 2021-10-29 DIAGNOSIS — Z3490 Encounter for supervision of normal pregnancy, unspecified, unspecified trimester: Secondary | ICD-10-CM

## 2021-10-29 LAB — CBC
HCT: 27.7 % — ABNORMAL LOW (ref 36.0–46.0)
Hemoglobin: 9.4 g/dL — ABNORMAL LOW (ref 12.0–15.0)
MCH: 24.4 pg — ABNORMAL LOW (ref 26.0–34.0)
MCHC: 33.9 g/dL (ref 30.0–36.0)
MCV: 71.9 fL — ABNORMAL LOW (ref 80.0–100.0)
Platelets: 260 10*3/uL (ref 150–400)
RBC: 3.85 MIL/uL — ABNORMAL LOW (ref 3.87–5.11)
RDW: 15.9 % — ABNORMAL HIGH (ref 11.5–15.5)
WBC: 7.3 10*3/uL (ref 4.0–10.5)
nRBC: 0 % (ref 0.0–0.2)

## 2021-10-29 LAB — TYPE AND SCREEN
ABO/RH(D): O POS
Antibody Screen: NEGATIVE

## 2021-10-29 MED ORDER — DIPHENHYDRAMINE HCL 50 MG/ML IJ SOLN: 12.5000 mg | INTRAMUSCULAR | Status: DC | PRN

## 2021-10-29 MED ORDER — LACTATED RINGERS IV SOLN: INTRAVENOUS | Status: DC

## 2021-10-29 MED ORDER — OXYCODONE-ACETAMINOPHEN 5-325 MG PO TABS: 2.0000 | ORAL_TABLET | ORAL | Status: DC | PRN

## 2021-10-29 MED ORDER — OXYTOCIN BOLUS FROM INFUSION
333.0000 mL | Freq: Once | INTRAVENOUS | Status: AC
Start: 2021-10-29 — End: 2021-10-29
  Administered 2021-10-29: 333 mL via INTRAVENOUS

## 2021-10-29 MED ORDER — ONDANSETRON HCL 4 MG/2ML IJ SOLN: 4.0000 mg | Freq: Four times a day (QID) | INTRAMUSCULAR | Status: DC | PRN

## 2021-10-29 MED ORDER — EPHEDRINE 5 MG/ML INJ: 10.0000 mg | INTRAVENOUS | Status: DC | PRN

## 2021-10-29 MED ORDER — LACTATED RINGERS IV SOLN: 500.0000 mL | INTRAVENOUS | Status: DC | PRN

## 2021-10-29 MED ORDER — PHENYLEPHRINE 80 MCG/ML (10ML) SYRINGE FOR IV PUSH (FOR BLOOD PRESSURE SUPPORT): 80.0000 ug | PREFILLED_SYRINGE | INTRAVENOUS | Status: DC | PRN

## 2021-10-29 MED ORDER — OXYCODONE-ACETAMINOPHEN 5-325 MG PO TABS: 1.0000 | ORAL_TABLET | ORAL | Status: DC | PRN

## 2021-10-29 MED ORDER — FENTANYL-BUPIVACAINE-NACL 0.5-0.125-0.9 MG/250ML-% EP SOLN
12.0000 mL/h | EPIDURAL | Status: DC | PRN
  Administered 2021-10-29: 12 mL/h via EPIDURAL
  Filled 2021-10-29: qty 250

## 2021-10-29 MED ORDER — OXYTOCIN-SODIUM CHLORIDE 30-0.9 UT/500ML-% IV SOLN: 2.5000 [IU]/h | INTRAVENOUS | Status: DC

## 2021-10-29 MED ORDER — OXYTOCIN-SODIUM CHLORIDE 30-0.9 UT/500ML-% IV SOLN
1.0000 m[IU]/min | INTRAVENOUS | Status: DC
  Administered 2021-10-29: 2 m[IU]/min via INTRAVENOUS
  Filled 2021-10-29: qty 500

## 2021-10-29 MED ORDER — SOD CITRATE-CITRIC ACID 500-334 MG/5ML PO SOLN
30.0000 mL | ORAL | Status: DC | PRN
Start: 2021-10-29 — End: 2021-10-29

## 2021-10-29 MED ORDER — FLEET ENEMA 7-19 GM/118ML RE ENEM
1.0000 | ENEMA | RECTAL | Status: DC | PRN
Start: 2021-10-29 — End: 2021-10-29

## 2021-10-29 MED ORDER — FENTANYL-BUPIVACAINE-NACL 0.5-0.125-0.9 MG/250ML-% EP SOLN: 12.0000 mL/h | EPIDURAL | Status: DC | PRN

## 2021-10-29 MED ORDER — LACTATED RINGERS IV SOLN: 500.0000 mL | Freq: Once | INTRAVENOUS | Status: DC

## 2021-10-29 MED ORDER — LIDOCAINE HCL (PF) 1 % IJ SOLN
INTRAMUSCULAR | Status: DC | PRN
  Administered 2021-10-29: 10 mL via EPIDURAL

## 2021-10-29 MED ORDER — TERBUTALINE SULFATE 1 MG/ML IJ SOLN: 0.2500 mg | Freq: Once | INTRAMUSCULAR | Status: DC | PRN

## 2021-10-29 MED ORDER — LIDOCAINE HCL (PF) 1 % IJ SOLN
30.0000 mL | INTRAMUSCULAR | Status: DC | PRN
Start: 2021-10-29 — End: 2021-10-29

## 2021-10-29 MED ORDER — ACETAMINOPHEN 325 MG PO TABS
650.0000 mg | ORAL_TABLET | ORAL | Status: DC | PRN
Start: 2021-10-29 — End: 2021-10-29

## 2021-10-29 NOTE — Progress Notes (Signed)
Patient has signed consent form for tubal. Did not pull epidural

## 2021-10-29 NOTE — Anesthesia Preprocedure Evaluation (Signed)
Anesthesia Evaluation  Patient identified by MRN, date of birth, ID band Patient awake    Reviewed: Allergy & Precautions, H&P , NPO status , Patient's Chart, lab work & pertinent test results, reviewed documented beta blocker date and time   Airway Mallampati: I  TM Distance: >3 FB Neck ROM: full    Dental no notable dental hx. (+) Teeth Intact, Dental Advisory Given   Pulmonary neg pulmonary ROS, Current Smoker,    Pulmonary exam normal breath sounds clear to auscultation       Cardiovascular negative cardio ROS Normal cardiovascular exam Rhythm:regular Rate:Normal     Neuro/Psych Seizures -,  PSYCHIATRIC DISORDERS Depression    GI/Hepatic negative GI ROS, Neg liver ROS,   Endo/Other  negative endocrine ROS  Renal/GU negative Renal ROS  negative genitourinary   Musculoskeletal   Abdominal   Peds  Hematology  (+) Blood dyscrasia, anemia ,   Anesthesia Other Findings   Reproductive/Obstetrics (+) Pregnancy                             Anesthesia Physical Anesthesia Plan  ASA: 3  Anesthesia Plan: Epidural   Post-op Pain Management: Minimal or no pain anticipated   Induction: Intravenous  PONV Risk Score and Plan: 1 and Treatment may vary due to age or medical condition  Airway Management Planned: Natural Airway  Additional Equipment:   Intra-op Plan:   Post-operative Plan:   Informed Consent: I have reviewed the patients History and Physical, chart, labs and discussed the procedure including the risks, benefits and alternatives for the proposed anesthesia with the patient or authorized representative who has indicated his/her understanding and acceptance.       Plan Discussed with: Anesthesiologist and CRNA  Anesthesia Plan Comments:         Anesthesia Quick Evaluation

## 2021-10-29 NOTE — Anesthesia Postprocedure Evaluation (Signed)
Anesthesia Post Note  Patient: Alexis [name]  Procedure(s) Performed: AN AD HOC LABOR EPIDURAL     Patient location during evaluation: Mother Baby Anesthesia Type: Epidural Level of consciousness: awake and alert Pain management: pain level controlled Vital Signs Assessment: post-procedure vital signs reviewed and stable Respiratory status: spontaneous breathing, nonlabored ventilation and respiratory function stable Cardiovascular status: stable Postop Assessment: no headache, no backache and epidural receding Anesthetic complications: no   No notable events documented.  Last Vitals:  Vitals:   10/29/21 2230 10/29/21 2245  BP: (!) 108/92 113/70  Pulse:  62  Resp:    Temp:      Last Pain:  Vitals:   10/29/21 2245  TempSrc:   PainSc: 0-No pain   Pain Goal:                   Hero Mccathern
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
## 2021-10-29 NOTE — Progress Notes (Signed)
Alexis [name] is a 26 y.o. G3P2002 at [redacted]w[redacted]d admitted for  IUGR  Subjective: no complaints Feeling some cramps and wondering if we can AROM Objective: BP (!) 92/48   Pulse 64   Temp 98.1 F (36.7 C) (Oral)   Resp 18   Ht 5\' 5"  (1.651 m)   Wt 73.9 kg   LMP 02/05/2021   BMI 27.12 kg/m  No intake/output data recorded.  FHR baseline 125 bpm, Variability: moderate, Accelerations:present, Decelerations:  Absent Toco:  uterine irratabilty    SVE:   Dilation: 4 Effacement (%): 70 Station: -2 Exam by:: K. [email or web address], CNM  Pitocin @ 8 mu/min  Labs: Lab Results  Component Value Date   WBC 7.3 10/29/2021   HGB 9.4 (L) 10/29/2021   HCT 27.7 (L) 10/29/2021   MCV 71.9 (L) 10/29/2021   PLT 260 10/29/2021    Assessment / Plan: Patient doing well; patient cervix is now 4 cm and pitocin on 8 m/u. She is still too high to AROM but could consider in 4-6 hours. Explained risk of infection and cord prolapse and patient understands need to be prudent in AROM.  Labor: early Fetal Wellbeing:  Category I Pain Control:  labor support without medications Pre-eclampsia: N/A I/D:  GBS neg Anticipated MOD: NSVB  12/29/2021 CNM, WHNP-BC 10/29/2021, 5:49 PM

## 2021-10-29 NOTE — Lactation Note (Signed)
This note was copied from a baby's chart. Lactation Consultation Note  Patient Name: Boy Branda Chaudhary LOVFI'E Date: 10/29/2021 Reason for consult: L&D Initial assessment;Early term 37-38.6wks Age:28 hours Mom was giving formula when LC came into rm. Mom stated she is breast/formula feeding. Mom stated she had lots of milk w/her daughter. LC noted veins on the breast. Mom stated every time she gets pregnant her veins does that. Breast were soft. Mom stated she doesn't have any questions about BF at this time. Encouraged to call if needs assistance or has questions.  Maternal Data Does the patient have breastfeeding experience prior to this delivery?: Yes How long did the patient breastfeed?: 4 months to her now 26 yr old  Feeding Nipple Type: Slow - flow  LATCH Score       Type of Nipple: Everted at rest and after stimulation  Comfort (Breast/Nipple): Soft / non-tender         Lactation Tools Discussed/Used    Interventions    Discharge    Consult Status Consult Status: Follow-up from L&D Date: 10/30/21 Follow-up type: In-patient    Charyl Dancer 10/29/2021, 10:46 PM

## 2021-10-29 NOTE — Progress Notes (Signed)
Alexis [name] is a 26 y.o. G3P2002 at [redacted]w[redacted]d by ultrasound admitted for induction of labor due to FGR 6%.  Subjective: Pt feeling contractions, sitting on birth ball for comfort, pt mother in room for support. Pt desires epidural eventually but not at this time.   Objective: BP (!) 89/51   Pulse 65   Temp 98.4 F (36.9 C)   Resp 16   Ht 5\' 5"  (1.651 m)   Wt 73.9 kg   LMP 02/05/2021   BMI 27.12 kg/m  No intake/output data recorded. No intake/output data recorded.  FHT:  FHR: 135 bpm, variability: moderate,  accelerations:  Present,  decelerations:  Absent UC:   regular, every 2-3 minutes SVE:   Dilation: 4.5 Effacement (%): 70 Station: -2 Exam by:: [email or web address], CNM  AROM with clear fluid, head at -2 station, well applied to cervix at time of AROM, pt tolerated well  Labs: Lab Results  Component Value Date   WBC 7.3 10/29/2021   HGB 9.4 (L) 10/29/2021   HCT 27.7 (L) 10/29/2021   MCV 71.9 (L) 10/29/2021   PLT 260 10/29/2021    Assessment / Plan: Induction of labor due to FGR,  progressing well on pitocin  Labor: Progressing normally Preeclampsia:   n/a Fetal Wellbeing:  Category I Pain Control:  Labor support without medications I/D:   GBS neg Anticipated MOD:  NSVD  12/29/2021, CNM 10/29/2021, 8:45 PM

## 2021-10-29 NOTE — Progress Notes (Signed)
Patient was unable to void when taken to bathroom with steady. Bladder scanned patient and result was <106.

## 2021-10-29 NOTE — Anesthesia Procedure Notes (Signed)
Epidural Patient location during procedure: OB Start time: 10/29/2021 9:00 PM End time: 10/29/2021 9:08 PM  Staffing Anesthesiologist: Bethena Midget, MD  Preanesthetic Checklist Completed: patient identified, IV checked, site marked, risks and benefits discussed, surgical consent, monitors and equipment checked, pre-op evaluation and timeout performed  Epidural Patient position: sitting Prep: DuraPrep and site prepped and draped Patient monitoring: continuous pulse ox and blood pressure Approach: midline Location: L4-L5 Injection technique: LOR air  Needle:  Needle type: Tuohy  Needle gauge: 17 G Needle length: 9 cm and 9 Needle insertion depth: 7 cm Catheter type: closed end flexible Catheter size: 19 Gauge Catheter at skin depth: 13 cm Test dose: negative  Assessment Events: blood not aspirated, injection not painful, no injection resistance, no paresthesia and negative IV test

## 2021-10-30 ENCOUNTER — Encounter (HOSPITAL_COMMUNITY): Payer: Self-pay | Admitting: Obstetrics & Gynecology

## 2021-10-30 LAB — RPR: RPR Ser Ql: NONREACTIVE

## 2021-10-30 MED ORDER — COCONUT OIL OIL: 1.0000 | TOPICAL_OIL | Status: DC | PRN

## 2021-10-30 MED ORDER — PRENATAL MULTIVITAMIN CH
1.0000 | ORAL_TABLET | Freq: Every day | ORAL | Status: DC
  Administered 2021-10-30 – 2021-10-31 (×2): 1 via ORAL
  Filled 2021-10-30 (×2): qty 1

## 2021-10-30 MED ORDER — DIPHENHYDRAMINE HCL 25 MG PO CAPS: 25.0000 mg | ORAL_CAPSULE | Freq: Four times a day (QID) | ORAL | Status: DC | PRN

## 2021-10-30 MED ORDER — BENZOCAINE-MENTHOL 20-0.5 % EX AERO
1.0000 | INHALATION_SPRAY | CUTANEOUS | Status: DC | PRN
  Administered 2021-10-30: 1 via TOPICAL
  Filled 2021-10-30: qty 56

## 2021-10-30 MED ORDER — ONDANSETRON HCL 4 MG PO TABS: 4.0000 mg | ORAL_TABLET | ORAL | Status: DC | PRN

## 2021-10-30 MED ORDER — ACETAMINOPHEN 325 MG PO TABS
650.0000 mg | ORAL_TABLET | ORAL | Status: DC | PRN
  Administered 2021-10-30 – 2021-10-31 (×3): 650 mg via ORAL
  Filled 2021-10-30 (×3): qty 2

## 2021-10-30 MED ORDER — TETANUS-DIPHTH-ACELL PERTUSSIS 5-2.5-18.5 LF-MCG/0.5 IM SUSY: 0.5000 mL | PREFILLED_SYRINGE | Freq: Once | INTRAMUSCULAR | Status: DC

## 2021-10-30 MED ORDER — FERROUS SULFATE 325 (65 FE) MG PO TABS
325.0000 mg | ORAL_TABLET | ORAL | Status: DC
  Administered 2021-10-30: 325 mg via ORAL
  Filled 2021-10-30: qty 1

## 2021-10-30 MED ORDER — CYCLOBENZAPRINE HCL 5 MG PO TABS
5.0000 mg | ORAL_TABLET | Freq: Three times a day (TID) | ORAL | Status: DC | PRN
  Administered 2021-10-30: 5 mg via ORAL
  Filled 2021-10-30: qty 1

## 2021-10-30 MED ORDER — ZOLPIDEM TARTRATE 5 MG PO TABS: 5.0000 mg | ORAL_TABLET | Freq: Every evening | ORAL | Status: DC | PRN

## 2021-10-30 MED ORDER — IBUPROFEN 600 MG PO TABS
600.0000 mg | ORAL_TABLET | Freq: Four times a day (QID) | ORAL | Status: DC
  Administered 2021-10-30 – 2021-10-31 (×7): 600 mg via ORAL
  Filled 2021-10-30 (×7): qty 1

## 2021-10-30 MED ORDER — SENNOSIDES-DOCUSATE SODIUM 8.6-50 MG PO TABS
2.0000 | ORAL_TABLET | Freq: Every day | ORAL | Status: DC
  Administered 2021-10-30 – 2021-10-31 (×2): 2 via ORAL
  Filled 2021-10-30 (×2): qty 2

## 2021-10-30 MED ORDER — MEASLES, MUMPS & RUBELLA VAC IJ SOLR
0.5000 mL | Freq: Once | INTRAMUSCULAR | Status: AC
  Administered 2021-10-31: 0.5 mL via SUBCUTANEOUS
  Filled 2021-10-30: qty 0.5

## 2021-10-30 MED ORDER — WITCH HAZEL-GLYCERIN EX PADS: 1.0000 | MEDICATED_PAD | CUTANEOUS | Status: DC | PRN

## 2021-10-30 MED ORDER — SIMETHICONE 80 MG PO CHEW: 80.0000 mg | CHEWABLE_TABLET | ORAL | Status: DC | PRN

## 2021-10-30 MED ORDER — ONDANSETRON HCL 4 MG/2ML IJ SOLN: 4.0000 mg | INTRAMUSCULAR | Status: DC | PRN

## 2021-10-30 MED ORDER — DIBUCAINE (PERIANAL) 1 % EX OINT: 1.0000 | TOPICAL_OINTMENT | CUTANEOUS | Status: DC | PRN
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
## 2021-10-30 NOTE — Progress Notes (Signed)
POSTPARTUM PROGRESS NOTE  Post Partum Day 1  Subjective:  Alexis [name] is a 26 y.o. [id number] s/p VD at [redacted]w[redacted]d.  She reports she is doing well. No acute events overnight. She denies any problems with ambulating, voiding or po intake. Denies nausea or vomiting.  Pain is well controlled.  Lochia is Adequate.  Objective: Blood pressure (!) 197/70, pulse 61, temperature 97.7 F (36.5 C), temperature source Oral, resp. rate 16, height 5\' 5"  (1.651 m), weight 73.9 kg, last menstrual period 02/05/2021, unknown if currently breastfeeding.  Physical Exam:  General: alert, cooperative and no distress Chest: no respiratory distress Heart:regular rate, distal pulses intact Uterine Fundus: firm, appropriately tender DVT Evaluation: No calf swelling or tenderness Extremities: none edema Skin: warm, dry  Recent Labs    10/29/21 1311  HGB 9.4*  HCT 27.7*    Assessment/Plan: Alexis [name] is a 26 y.o. 34 s/p VD at [redacted]w[redacted]d   PPD#1 - Doing well  Routine postpartum care  Ferrous sulfate QOD Contraception: outpt IUD Feeding: Breast and bottle Dispo: Plan for discharge tomorrow.   LOS: 1 day   [redacted]w[redacted]d, DO OB Fellow  10/30/2021, 8:10 AM

## 2021-10-30 NOTE — Lactation Note (Addendum)
This note was copied from a baby's chart. Lactation Consultation Note  Patient Name: Alexis [name] RCBUL'A Date: 10/30/2021 Reason for consult: Initial assessment;Infant < 6lbs;Early term 37-38.6wks;Infant weight loss;Breastfeeding assistance (3.02% WL) Age:28 hours  P2, Term, Infant Female  LC entered the room and baby was asleep STS with the birth parent.  The birth parent states that she has some breastfeeding experience, but stopped due to being engorged.  She states that she has been giving bottles since birth, but would like to work on pumping and latching baby.  LC set up DEBP and the parent is using a size #21 flange.  LC spoke with the parent about the LPTI policy and supplementing baby after putting him to the breast.  LC showed the birth parent how to hand express. Drops were noted.  LC reviewed outpatient services brochure.  LC left her name on the board and asked the parent to call when baby is awake and ready to feed.   The birth parent states that she has no questions or concerns.   Current Feeding Plan:  Put baby to the breast before supplementing.  Breastfeed according to feeding cues 8+ times in 24 hours.  Supplement according to feeding guidelines.  Hand express for stimulation.  Call RN/LC for assistance with breastfeeding.   Maternal Data Has patient been taught Hand Expression?: Yes Does the patient have breastfeeding experience prior to this delivery?: Yes How long did the patient breastfeed?: 2 months with her oldest  Feeding Mother's Current Feeding Choice: Breast Milk and Formula Nipple Type: Extra Slow Flow  Lactation Tools Discussed/Used Tools: Pump;Flanges Flange Size: 24 Breast pump type: Double-Electric Breast Pump Pump Education: Setup, frequency, and cleaning;Milk Storage Reason for Pumping: Infant <6lbs Pumping frequency: q3hrs/after each feeding  Interventions Interventions: Breast feeding basics reviewed;Hand express;DEBP;Education;LC  Services brochure  Discharge Pump: Personal;Hands Free  Consult Status Consult Status: Follow-up Date: 10/31/21 Follow-up type: In-patient    Orvil Feil Consuella Scurlock 10/30/2021, 11:51 AM

## 2021-10-30 NOTE — Progress Notes (Signed)
Circumcision Consent   -Circumcision procedure details discussed including usage of local anesthesia, infant soother, and removal and disposal of foreskin. -Risks and benefits of procedure were reviewed including, but not limited to:  *Benefits include reduction in the rates of urinary tract infection (UTI), penile cancer, some sexually transmitted infections, penile inflammatory, and retractile disorders, as well as easier hygiene.   *Risks include bleeding, infection, injury of glans which may lead to need for additional surgery, penile deformity, or urinary tract issues, unsatisfactory cosmetic appearance and other potential complications related to the procedure.   -Informed that procedure will not be performed if provider deems inappropriate d/t penile size, noted deformity, or unsatisfactory pediatric evaluation. -It was emphasized that this is an elective procedure.   -Post circumcision care discussed.   Patient wants to proceed with circumcision. Informed that team would be notified and complete prior to discharge and upon satisfactory pediatric evaluation of infant.    Myrtie Hawk, DO FMOB Fellow, Faculty practice [facility], Center for [facility] 9:32 AM

## 2021-10-30 NOTE — Progress Notes (Signed)
Patient stated does not want to proceed with planned BTL today. Complains of pain & hunger & can not comply with NPO @ this time. Notified Sharen Counter with instructions to let patient eat & will resched surgery this afternoon. Patient agreeable. Provided meal tray & scheduled pain meds given. Epidural cath intact.

## 2021-10-30 NOTE — Discharge Summary (Signed)
Postpartum Discharge Summary  Date of Service updated***     Patient Name: Alexis [name] DOB: 1995-10-14 MRN: [id number]  Date of admission: 10/29/2021 Delivery date:10/29/2021  Delivering provider: Fatima Blank A  Date of discharge: 10/30/2021  Admitting diagnosis: IUGR (intrauterine growth restriction) affecting care of mother, third trimester, fetus 1 [O36.5931] Intrauterine pregnancy: [redacted]w[redacted]d    Secondary diagnosis:  Principal Problem:   IUGR (intrauterine growth restriction) affecting care of mother, third trimester, fetus 1 Active Problems:   NSVD (normal spontaneous vaginal delivery)  Additional problems: ***    Discharge diagnosis: {DX.:23714}                                              Post partum procedures:{Postpartum procedures:23558} Augmentation: {{NWGNFAOZHYQM:57846}Complications: {OB Labor/Delivery Complications:20784}  Hospital course: {Courses:23701}  Magnesium Sulfate received: {Mag received:30440022} BMZ received: {BMZ received:30440023} Rhophylac:{Rhophylac received:30440032} MMR:{MMR:30440033} T-DaP:{Tdap:23962} Flu: {{NGE:95284}Transfusion:{Transfusion received:30440034}  Physical exam  Vitals:   10/29/21 2230 10/29/21 2245 10/29/21 2300 10/29/21 2344  BP: (!) 108/92 113/70 115/66 105/60  Pulse:  62 66 (!) 58  Resp:    18  Temp:    98.1 F (36.7 C)  TempSrc:    Oral  Weight:      Height:       General: {Exam; general:21111117} Lochia: {Desc; appropriate/inappropriate:30686::"appropriate"} Uterine Fundus: {Desc; firm/soft:30687} Incision: {Exam; incision:21111123} DVT Evaluation: {Exam; dvt:2111122} Labs: Lab Results  Component Value Date   WBC 7.3 10/29/2021   HGB 9.4 (L) 10/29/2021   HCT 27.7 (L) 10/29/2021   MCV 71.9 (L) 10/29/2021   PLT 260 10/29/2021      Latest Ref Rng & Units 10/07/2021    2:52 AM  CMP  Glucose 70 - 99 mg/dL 81   BUN 6 - 20 mg/dL <5   Creatinine 0.44 - 1.00 mg/dL 0.44   Sodium 135 - 145  mmol/L 137   Potassium 3.5 - 5.1 mmol/L 3.6   Chloride 98 - 111 mmol/L 108   CO2 22 - 32 mmol/L 21   Calcium 8.9 - 10.3 mg/dL 8.6   Total Protein 6.5 - 8.1 g/dL 6.2   Total Bilirubin 0.3 - 1.2 mg/dL 0.6   Alkaline Phos 38 - 126 U/L 102   AST 15 - 41 U/L 13   ALT 0 - 44 U/L 7    Edinburgh Score:    02/08/2019    4:58 PM  Edinburgh Postnatal Depression Scale Screening Tool  I have been able to laugh and see the funny side of things. 0  I have looked forward with enjoyment to things. 0  I have blamed myself unnecessarily when things went wrong. 1  I have been anxious or worried for no good reason. 1  I have felt scared or panicky for no good reason. 0  Things have been getting on top of me. 1  I have been so unhappy that I have had difficulty sleeping. 0  I have felt sad or miserable. 1  I have been so unhappy that I have been crying. 1  The thought of harming myself has occurred to me. 0  Edinburgh Postnatal Depression Scale Total 5     After visit meds:  Allergies as of 10/30/2021   No Known Allergies   Med Rec must be completed [facility] to [facility]        Discharge  home in stable condition Infant Feeding: {Baby feeding:23562} Infant Disposition:{CHL IP OB HOME WITH GYJEHU:31497} Discharge instruction: per After Visit Summary and Postpartum booklet. Activity: Advance as tolerated. Pelvic rest for 6 weeks.  Diet: {OB WYOV:78588502} Future Appointments: Future Appointments  Date Time Provider Shannon  12/14/2021 10:55 AM Megan Salon, MD [facility]   Follow up Visit:   Please schedule this patient for a {Visit type:23955} postpartum visit in {Postpartum visit:23953} with the following provider: {Provider type:23954}. Additional Postpartum F/U:{PP Procedure:23957}  {Risk DXAJO:87867} pregnancy complicated by: {EHMCNOBSJGGE:36629} Delivery mode:  Vaginal, Spontaneous  Anticipated Birth Control:  {Birth Control:23956}   10/30/2021 Fatima Blank, CNM

## 2021-10-30 NOTE — Lactation Note (Signed)
This note was copied from a baby's chart. Lactation Consultation Note  Patient Name: Alexis [name] Date: 10/30/2021 Reason for consult: Follow-up assessment;Mother's request;Difficult latch;Early term 37-38.6wks;Infant weight loss;Breastfeeding assistance (3.02% WL) Age:28 hours P2, Early Term, Infant Female  LC entered the room and the birth parent was attempting to latch baby.  Per the birth parent, she tried to use the pump and she felt cramping in her uterus.  LC explained to the parent that cramping can happen when latching or pumping.  LC attempted to assist the parent with putting baby to the right breast in the cradle position.  Baby was rooting, but would not open his mouth wide to latch.  LC assisted the parent with bottle feeding.  Baby would not open his mouth to eat from the bottle initially.  LC got baby to take the bottle.  The birth parent continued to feed baby when [facility] left the room.  The birth parent will pump q3hrs while continuing to work on the latch.  Maternal Data Has patient been taught Hand Expression?: Yes Does the patient have breastfeeding experience prior to this delivery?: Yes How long did the patient breastfeed?: 2 months with her oldest  Feeding Mother's Current Feeding Choice: Breast Milk and Formula  LATCH Score Latch: Repeated attempts needed to sustain latch, nipple held in mouth throughout feeding, stimulation needed to elicit sucking reflex.  Audible Swallowing: None  Type of Nipple: Everted at rest and after stimulation  Comfort (Breast/Nipple): Soft / non-tender  Hold (Positioning): Assistance needed to correctly position infant at breast and maintain latch.  LATCH Score: 6   Lactation Tools Discussed/Used Tools: Pump;Flanges Flange Size: 24 Breast pump type: Double-Electric Breast Pump Pump Education: Setup, frequency, and cleaning;Milk Storage Reason for Pumping: Infant <6lbs Pumping frequency: q3hrs/after each  feeding  Interventions Interventions: Assisted with latch;Adjust position;Education  Discharge Pump: Personal;Hands Free  Consult Status Consult Status: Follow-up Date: 10/31/21 Follow-up type: In-patient    Alexis [name] 10/30/2021, 1:19 PM

## 2021-10-31 MED ORDER — IBUPROFEN 600 MG PO TABS: 600.0000 mg | ORAL_TABLET | Freq: Four times a day (QID) | ORAL | 0 refills | Status: DC

## 2021-10-31 MED ORDER — ACETAMINOPHEN 325 MG PO TABS: 650.0000 mg | ORAL_TABLET | ORAL | 0 refills | Status: DC | PRN

## 2021-11-01 ENCOUNTER — Encounter (HOSPITAL_BASED_OUTPATIENT_CLINIC_OR_DEPARTMENT_OTHER): Payer: Medicaid Other | Admitting: Obstetrics & Gynecology

## 2021-11-03 ENCOUNTER — Ambulatory Visit: Payer: Medicaid Other

## 2021-11-04 ENCOUNTER — Encounter (HOSPITAL_BASED_OUTPATIENT_CLINIC_OR_DEPARTMENT_OTHER): Payer: Medicaid Other | Admitting: Obstetrics & Gynecology

## 2021-11-06 ENCOUNTER — Telehealth (HOSPITAL_COMMUNITY): Payer: Self-pay

## 2021-11-06 NOTE — Telephone Encounter (Signed)
Patient reports feeling good. Patient declines questions/concerns about her health and healing.  Patient reports that baby is doing well. Eating, peeing/pooping, and gaining weight well. Baby sleeps in a bassinet. RN reviewed ABC's of safe sleep with patient. Patient declines any questions or concerns about baby.  EPDS score is 4.  Sharyn Lull [facility]  11/06/21,1122

## 2021-11-09 ENCOUNTER — Encounter (HOSPITAL_BASED_OUTPATIENT_CLINIC_OR_DEPARTMENT_OTHER): Payer: Medicaid Other | Admitting: Advanced Practice Midwife

## 2021-11-15 ENCOUNTER — Encounter (HOSPITAL_BASED_OUTPATIENT_CLINIC_OR_DEPARTMENT_OTHER): Payer: Medicaid Other | Admitting: Obstetrics & Gynecology

## 2021-12-14 ENCOUNTER — Telehealth (HOSPITAL_BASED_OUTPATIENT_CLINIC_OR_DEPARTMENT_OTHER): Payer: Self-pay | Admitting: Obstetrics & Gynecology

## 2021-12-14 ENCOUNTER — Ambulatory Visit (HOSPITAL_BASED_OUTPATIENT_CLINIC_OR_DEPARTMENT_OTHER): Payer: Medicaid Other | Admitting: Obstetrics & Gynecology

## 2021-12-14 NOTE — Telephone Encounter (Signed)
Called  patient  and left a message  to please call the office back about missed appointment today .

## 2022-01-05 ENCOUNTER — Ambulatory Visit: Payer: Self-pay

## 2022-01-05 ENCOUNTER — Ambulatory Visit: Payer: Medicaid Other

## 2022-02-21 NOTE — L&D Delivery Note (Addendum)
OB/GYN Faculty Practice Delivery Note  Alexis [name] is a 27 y.o. [id number] s/p SVD at [redacted]w[redacted]d. She was admitted for PPROM.   ROM: 8h 67m with pink to brown tinged fluid GBS Status: Unknown, pending - given PCN Maximum Maternal Temperature: 98.65F   Labor Progress: Initial SVE: 4/50/-3. She then progressed to complete.   Delivery Date/Time: 12/14/22 0015 Delivery: Called to room and patient was complete and pushing. Head delivered LOA. Delivered through loose body cord which was reduced after delivery. Shoulder and body delivered in usual fashion. Infant with spontaneous cry, placed on mother's abdomen, dried and stimulated. Cord clamped x 2 after 1-minute delay, and cut by baby's maternal grandmother. Cord blood drawn. Placenta delivered spontaneously with gentle cord traction. Labia, perineum, vagina, and cervix inspected inspected with a hemostatic periurethral tear noted, which was not repaired. Pitocin started and TXA given at time of delivery given anemia on arrival, fundal massage performed with expulsion of moderate clots. Fundus firm on repeat fundal massage. Baby Weight: pending  Placenta: Sent to pathology (abdominal trauma reported on day prior to d/c) Complications: None Lacerations: Hemostatic periurethral tear EBL: 79 mL Analgesia: Epidural   Infant:  APGAR (1 MIN): 8  APGAR (5 MINS): 9   Sundra Aland, MD OB Fellow, Faculty Practice [facility], Center for [facility]

## 2022-02-26 ENCOUNTER — Other Ambulatory Visit: Payer: Self-pay

## 2022-02-26 ENCOUNTER — Emergency Department (HOSPITAL_COMMUNITY)
Admission: EM | Admit: 2022-02-26 | Discharge: 2022-02-26 | Disposition: A | Discharge status: Home / Self Care | Payer: Medicaid Other | Attending: Student | Admitting: Student

## 2022-02-26 DIAGNOSIS — Z202 Contact with and (suspected) exposure to infections with a predominantly sexual mode of transmission: Secondary | ICD-10-CM | POA: Diagnosis present

## 2022-02-26 DIAGNOSIS — B9689 Other specified bacterial agents as the cause of diseases classified elsewhere: Secondary | ICD-10-CM | POA: Diagnosis not present

## 2022-02-26 DIAGNOSIS — N760 Acute vaginitis: Secondary | ICD-10-CM | POA: Diagnosis not present

## 2022-02-26 LAB — URINALYSIS, ROUTINE W REFLEX MICROSCOPIC
Bilirubin Urine: NEGATIVE
Glucose, UA: NEGATIVE mg/dL
Hgb urine dipstick: NEGATIVE
Ketones, ur: 5 mg/dL — AB
Nitrite: NEGATIVE
Protein, ur: 30 mg/dL — AB
Specific Gravity, Urine: 1.032 — ABNORMAL HIGH (ref 1.005–1.030)
WBC, UA: >50 WBC/hpf — ABNORMAL HIGH (ref 0–5)
pH: 5 (ref 5.0–8.0)

## 2022-02-26 LAB — WET PREP, GENITAL
Sperm: NONE SEEN
Trich, Wet Prep: NONE SEEN
WBC, Wet Prep HPF POC: >=10 — AB (ref <10)
Yeast Wet Prep HPF POC: NONE SEEN

## 2022-02-26 LAB — PREGNANCY, URINE: Preg Test, Ur: NEGATIVE

## 2022-02-26 MED ORDER — METRONIDAZOLE 500 MG PO TABS: 500.0000 mg | ORAL_TABLET | Freq: Two times a day (BID) | ORAL | 0 refills | Status: AC

## 2022-02-26 MED ORDER — CEFTRIAXONE SODIUM 500 MG IJ SOLR
500.0000 mg | Freq: Once | INTRAMUSCULAR | Status: AC
  Administered 2022-02-26: 500 mg via INTRAMUSCULAR
  Filled 2022-02-26: qty 500

## 2022-02-26 MED ORDER — DOXYCYCLINE HYCLATE 100 MG PO CAPS: 100.0000 mg | ORAL_CAPSULE | Freq: Two times a day (BID) | ORAL | 0 refills | Status: AC

## 2022-02-26 MED ORDER — LIDOCAINE HCL (PF) 1 % IJ SOLN
1.0000 mL | Freq: Once | INTRAMUSCULAR | Status: AC
  Administered 2022-02-26: 1.2 mL
  Filled 2022-02-26: qty 5

## 2022-02-26 NOTE — ED Provider Notes (Signed)
[facility] Provider Note   CSN: [id number] Arrival date & time: 02/26/22  0128     History  Chief Complaint  Patient presents with   STD screening     Alexis KittyMikayla N Degrazia is a 27 y.o. female.  HPI   Patient medical history including seizures presents emerged part complaints of of STD exposure, states that her partner started to have dysuria and she is concerned that he might have an STD and gave it to her.  She is currently not having any symptoms at this time, no endorsing any pelvic pain flank pain back pain, no vaginal discharge no vaginal bleeding, she is currently not on birth control.  She has no other complaints    Home Medications Prior to Admission medications   Medication Sig Start Date End Date Taking? Authorizing Provider  doxycycline (VIBRAMYCIN) 100 MG capsule Take 1 capsule (100 mg total) by mouth 2 (two) times daily for 7 days. 02/26/22 03/05/22 Yes Carroll SageFaulkner, Dorathea Faerber J, PA-C  metroNIDAZOLE (FLAGYL) 500 MG tablet Take 1 tablet (500 mg total) by mouth 2 (two) times daily for 7 days. 02/26/22 03/05/22 Yes Carroll SageFaulkner, Harutyun Monteverde J, PA-C  acetaminophen (TYLENOL) 325 MG tablet Take 2 tablets (650 mg total) by mouth every 4 (four) hours as needed (for pain scale < 4). 10/31/21   Autry-Lott, Randa EvensSimone, DO  ferrous sulfate 325 (65 FE) MG EC tablet Take 1 tablet (325 mg total) by mouth every other day. Patient not taking: Reported on 10/26/2021 05/18/21   Raelyn Moraawson, Rolitta, CNM  ibuprofen (ADVIL) 600 MG tablet Take 1 tablet (600 mg total) by mouth every 6 (six) hours. 10/31/21   Autry-Lott, Randa EvensSimone, DO  Prenatal Vit-Fe Fumarate-FA (PRENATAL PLUS VITAMIN/MINERAL) 27-1 MG TABS Take 1 tablet by mouth daily. 04/19/21   Jerene BearsMiller, Mary S, MD      Allergies    Patient has no known allergies.    Review of Systems   Review of Systems  Constitutional:  Negative for chills and fever.  Respiratory:  Negative for shortness of breath.   Cardiovascular:  Negative for  chest pain.  Gastrointestinal:  Negative for abdominal pain.  Neurological:  Negative for headaches.    Physical Exam Updated Vital Signs BP (!) 142/112 (BP Location: Right Arm)   Pulse 76   Resp 16   SpO2 100%  Physical Exam Vitals and nursing note reviewed.  Constitutional:      General: She is not in acute distress.    Appearance: She is not ill-appearing.  HENT:     Head: Normocephalic and atraumatic.     Nose: No congestion.  Eyes:     Conjunctiva/sclera: Conjunctivae normal.  Cardiovascular:     Rate and Rhythm: Normal rate and regular rhythm.  Pulmonary:     Effort: Pulmonary effort is normal.  Abdominal:     Palpations: Abdomen is soft.     Tenderness: There is no abdominal tenderness. There is no right CVA tenderness or left CVA tenderness.  Skin:    General: Skin is warm and dry.  Neurological:     Mental Status: She is alert.  Psychiatric:        Mood and Affect: Mood normal.     ED Results / Procedures / Treatments   Labs (all labs ordered are listed, but only abnormal results are displayed) Labs Reviewed  WET PREP, GENITAL - Abnormal; Notable for the following components:      Result Value   Clue Cells Wet Prep HPF  POC PRESENT (*)    WBC, Wet Prep HPF POC >=10 (*)    All other components within normal limits  URINALYSIS, ROUTINE W REFLEX MICROSCOPIC - Abnormal; Notable for the following components:   APPearance CLOUDY (*)    Specific Gravity, Urine 1.032 (*)    Ketones, ur 5 (*)    Protein, ur 30 (*)    Leukocytes,Ua LARGE (*)    WBC, UA >50 (*)    Bacteria, UA RARE (*)    All other components within normal limits  PREGNANCY, URINE  GC/CHLAMYDIA PROBE AMP ([place]) NOT AT [facility]    EKG None  Radiology No results found.  Procedures Procedures    Medications Ordered in ED Medications  cefTRIAXone (ROCEPHIN) injection 500 mg (has no administration in time range)  lidocaine (PF) (XYLOCAINE) 1 % injection 1-2.1 mL (has no  administration in time range)    ED Course/ Medical Decision Making/ A&P                           Medical Decision Making Amount and/or Complexity of Data Reviewed Labs: ordered.   This patient presents to the ED for concern of STD exposure, this involves an extensive number of treatment options, and is a complaint that carries with it a high risk of complications and morbidity.  The differential diagnosis includes PID, UTI, ectopic pregnancy    Additional history obtained:  Additional history obtained from N/A External records from outside source obtained and reviewed including OB/GYN notes   Co morbidities that complicate the patient evaluation  N/A  Social Determinants of Health:  N/A    Lab Tests:  I Ordered, and personally interpreted labs.  The pertinent results include: GC chlamydia pending, wet prep shows positive clue cells,, urine pregnancy is negative, UA shows large leukocytes red blood cells white blood cells rare bacteria squamous cells present   Imaging Studies ordered:  I ordered imaging studies including N/A I independently visualized and interpreted imaging which showed N/A I agree with the radiologist interpretation   Cardiac Monitoring:  The patient was maintained on a cardiac monitor.  I personally viewed and interpreted the cardiac monitored which showed an underlying rhythm of: n/a   Medicines ordered and prescription drug management:  I ordered medication including  I have reviewed the patients home medicines and have made adjustments as needed  Critical Interventions:  N/a   Reevaluation:   Presents with complaints of STD exposure, currently has no symptoms, will obtain GC chlamydia as well as a wet prep, I offered HIV while syphilis but she deferred on this.  Had a discussion with patient regards to treatment this time, explained that we can wait till attentional to come back in June or back for treatment or she can be treated  prophylactically, he states odds for heavy infection is low she has no symptoms, she explained that she would like to be treated, I do not find this unreasonable.  She was given a shot of ceftriaxone will be given doxycycline.      Consultations Obtained:  N/A    Test Considered:  N/A    Rule out Suspicion for ectopic pregnancy is low as urine pregnancy is negative.  I doubt ovarian torsion or cysts she has no pelvic or suprapubic pain.  I doubt UTI [place] kidney stone does not endorse any urinary symptoms UA is slightly abnormal with red blood cells and white blood cells, but there are squamous  cells present which could contaminate the specimen, I would not treat this time as she is not having urinary symptoms.  It is also possible that she is suffering from an STI which can alter the UA which she will be treated for here in the ED.    Dispostion and problem list  After consideration of the diagnostic results and the patients response to treatment, I feel that the patent would benefit from discharge.  STD exposure-patient was given ceftriaxone while in the ED to cover for gonorrhea, will also discharge home on doxycycline, I explained that if her chlamydia test is negative to discontinue doxycycline. BV-patient will be started on Flagyl and have her follow-up for further evaluation            Final Clinical Impression(s) / ED Diagnoses Final diagnoses:  STD exposure  BV (bacterial vaginosis)    Rx / DC Orders ED Discharge Orders          Ordered    doxycycline (VIBRAMYCIN) 100 MG capsule  2 times daily        02/26/22 0303    metroNIDAZOLE (FLAGYL) 500 MG tablet  2 times daily        02/26/22 0303              Carroll Sage, PA-C 02/26/22 0303    Sabas Sous, MD 02/26/22 [phone]

## 2022-02-26 NOTE — ED Triage Notes (Signed)
Patient requesting STD screening , denies any symptoms , patient stated her spouse has symptoms .

## 2022-02-26 NOTE — Discharge Instructions (Addendum)
You have presented for STD testing, your gonorrhea and Chlamydia test take 2 days to results you will find results on your MyChart account, I have given you a shot of ceftriaxone to treat you for gonorrhea, I have given you a medication called doxycycline this coverages for chlamydia, if you see on your MyChart account that you are negative for chlamydia you may discontinue the doxycycline.  In the future you may follow-up with the health department as a screening treat for free.  Your results show that you have BV bacterial vaginosis, this is not an STD, this is just an overgrowth of normal bacteria in the vaginal canal started you Flagyl please take as prescribed do not drink alcohol tightness medications.  Come back to the emergency department if you develop chest pain, shortness of breath, severe abdominal pain, uncontrolled nausea, vomiting, diarrhea.

## 2022-02-27 ENCOUNTER — Other Ambulatory Visit: Payer: Self-pay

## 2022-02-27 ENCOUNTER — Emergency Department (HOSPITAL_COMMUNITY)
Admission: EM | Admit: 2022-02-27 | Discharge: 2022-02-27 | Disposition: A | Discharge status: Home / Self Care | Payer: Medicaid Other | Attending: Emergency Medicine | Admitting: Emergency Medicine

## 2022-02-27 ENCOUNTER — Emergency Department (HOSPITAL_COMMUNITY): Payer: Medicaid Other

## 2022-02-27 DIAGNOSIS — S6391XA Sprain of unspecified part of right wrist and hand, initial encounter: Secondary | ICD-10-CM | POA: Diagnosis not present

## 2022-02-27 DIAGNOSIS — S60921A Unspecified superficial injury of right hand, initial encounter: Secondary | ICD-10-CM | POA: Diagnosis present

## 2022-02-27 DIAGNOSIS — W2209XA Striking against other stationary object, initial encounter: Secondary | ICD-10-CM | POA: Diagnosis not present

## 2022-02-27 MED ORDER — OXYCODONE-ACETAMINOPHEN 5-325 MG PO TABS
1.0000 | ORAL_TABLET | Freq: Once | ORAL | Status: AC
  Administered 2022-02-27: 1 via ORAL
  Filled 2022-02-27: qty 1

## 2022-02-27 NOTE — ED Provider Notes (Signed)
Alexis [name]   CSN: [id number] Arrival date & time: 02/27/22  0446     History  Chief Complaint  Patient presents with   Hand Injury    Alexis [name] is a 27 y.o. female who presents to the ED with concerns for right hand injury onset PTA.  Patient notes that she punched a wall door with her close fist.  No meds tried prior to arrival.  Denies history of similar symptoms.  Has associated swelling.  Denies wrist pain.  The history is provided by the patient. No language interpreter was used.       Home Medications Prior to Admission medications   Medication Sig Start Date End Date Taking? Authorizing Provider  acetaminophen (TYLENOL) 325 MG tablet Take 2 tablets (650 mg total) by mouth every 4 (four) hours as needed (for pain scale < 4). 10/31/21   Autry-Lott, Randa Evens, DO  doxycycline (VIBRAMYCIN) 100 MG capsule Take 1 capsule (100 mg total) by mouth 2 (two) times daily for 7 days. 02/26/22 03/05/22  Carroll Sage, PA-C  ferrous sulfate 325 (65 FE) MG EC tablet Take 1 tablet (325 mg total) by mouth every other day. Patient not taking: Reported on 10/26/2021 05/18/21   Raelyn Mora, CNM  ibuprofen (ADVIL) 600 MG tablet Take 1 tablet (600 mg total) by mouth every 6 (six) hours. 10/31/21   Autry-Lott, Randa Evens, DO  metroNIDAZOLE (FLAGYL) 500 MG tablet Take 1 tablet (500 mg total) by mouth 2 (two) times daily for 7 days. 02/26/22 03/05/22  Carroll Sage, PA-C  Prenatal Vit-Fe Fumarate-FA (PRENATAL PLUS VITAMIN/MINERAL) 27-1 MG TABS Take 1 tablet by mouth daily. 04/19/21   Jerene Bears, MD      Allergies    Patient has no known allergies.    Review of Systems   Review of Systems  All other systems reviewed and are negative.   Physical Exam Updated Vital Signs BP (!) 128/90 (BP Location: Left Arm)   Pulse 83   Temp 98.2 F (36.8 C) (Oral)   Resp 18   Ht 5\' 4"  (1.626 m)   Wt 66.7 kg   LMP 02/24/2022 (Approximate)   SpO2  100%   BMI 25.23 kg/m  Physical Exam Vitals and nursing [name] reviewed.  Constitutional:      General: She is not in acute distress.    Appearance: Normal appearance.  Eyes:     General: No scleral icterus.    Extraocular Movements: Extraocular movements intact.  Cardiovascular:     Rate and Rhythm: Normal rate.  Pulmonary:     Effort: Pulmonary effort is normal. No respiratory distress.  Musculoskeletal:     Cervical back: Neck supple.     Comments: Tenderness to palpation to distal 4th and 5th metacarpal with swelling noted to the area. Decreased range of motion of right 4th and 5th phalanges secondary to pain. Normal flexion and extension of right wrist. Capillary refill is <2 seconds. Radial pulse intact.   Skin:    General: Skin is warm and dry.     Findings: No bruising, erythema or rash.  Neurological:     Mental Status: She is alert.  Psychiatric:        Behavior: Behavior normal.     ED Results / Procedures / Treatments   Labs (all labs ordered are listed, but only abnormal results are displayed) Labs Reviewed - No data to display  EKG None  Radiology DG Hand Complete Right  Result  Date: 02/27/2022 CLINICAL DATA:  Trauma.  Punched a wall. EXAM: RIGHT HAND - COMPLETE 3+ VIEW COMPARISON:  None Available. FINDINGS: There is no evidence of fracture or dislocation. There is no evidence of arthropathy or other focal bone abnormality. Soft tissues are unremarkable. IMPRESSION: Negative. Electronically Signed   By: Misty Stanley M.D.   On: 02/27/2022 06:32    Procedures Procedures    Medications Ordered in ED Medications  oxyCODONE-acetaminophen (PERCOCET/ROXICET) 5-325 MG per tablet 1 tablet (1 tablet Oral Given 02/27/22 0546)    ED Course/ Medical Decision Making/ A&P Clinical Course as of 02/27/22 0981  Nancy Fetter Feb 27, 2022  0639 Re-evaluated and noted improvement of symptoms with treatment regimen. Discussed discharge treatment plan. Pt agreeable at this time. Pt  appears safe for discharge. [SB]    Clinical Course User Index [SB] Dhruvan Gullion A, PA-C                           Medical Decision Making Amount and/or Complexity of Data Reviewed Radiology: ordered.  Risk Prescription drug management.   Pt presents with right hand pain onset PTA. Pt punched a wall. Vital signs, pt afebrile. On exam, pt with Tenderness to palpation to distal 4th and 5th metacarpal with swelling noted to the area. Decreased range of motion of right 4th and 5th phalanges secondary to pain. Normal flexion and extension of right wrist. Capillary refill is <2 seconds. Radial pulse intact. Differential diagnosis includes fracture, dislocation, sprain.    Imaging: I ordered imaging studies including right hand/wrist xray I independently visualized and interpreted imaging which showed: No acute fracture or dislocation I agree with the radiologist interpretation  Medications:  I ordered medication including percocet for symptom management Reevaluation of the patient after these medicines and interventions, I reevaluated the patient and found that they have improved I have reviewed the patients home medicines and have made adjustments as needed  Disposition: Presentation suspicious for sprain of right hand. Doubt at this time fracture, dislocation, or herniation. After consideration of the diagnostic results and the patients response to treatment, I feel that the patient would benefit from Discharge home. Supportive care measures and strict return precautions discussed with patient at bedside. Pt acknowledges and verbalizes understanding. Pt appears safe for discharge. Follow up as indicated in discharge paperwork.    This chart was dictated using voice recognition software, Dragon. Despite the best efforts of this provider to proofread and correct errors, errors may still occur which can change documentation meaning.   Final Clinical Impression(s) / ED Diagnoses Final  diagnoses:  Hand sprain, right, initial encounter    Rx / DC Orders ED Discharge Orders     None         Derral Colucci A, PA-C 02/27/22 0639    Shanon Rosser, MD 02/27/22 [phone]

## 2022-02-27 NOTE — Discharge Instructions (Addendum)
It was a pleasure taking care of you today!   Your xrays didn't show any concerning findings today.  You may take over-the-counter 600 mg ibuprofen every 6 hours and alternate with 500 mg Tylenol every 6 hours as needed for your pain.  Please ice to the affected area up to 15-20 minutes at a time, sure to place.  Between your skin and the ice.  You may follow-up with your primary care provider as needed.  Return to the emergency department if you are experiencing increasing/worsening symptoms.

## 2022-02-27 NOTE — ED Triage Notes (Signed)
Pt punched a door tonight, now unable to make a fist and is unable to perform wrist ROM d/t pain. No h/o injury to this hand.  Denies influence of etOH or drugs.

## 2022-02-28 LAB — GC/CHLAMYDIA PROBE AMP (~~LOC~~) NOT AT ARMC
Chlamydia: POSITIVE — AB
Comment: NEGATIVE
Comment: NORMAL
Neisseria Gonorrhea: POSITIVE — AB

## 2022-03-16 ENCOUNTER — Ambulatory Visit
Admission: EM | Admit: 2022-03-16 | Discharge: 2022-03-16 | Disposition: A | Discharge status: Home / Self Care | Payer: Medicaid Other | Attending: Physician Assistant | Admitting: Physician Assistant

## 2022-03-16 DIAGNOSIS — N3000 Acute cystitis without hematuria: Secondary | ICD-10-CM | POA: Diagnosis not present

## 2022-03-16 DIAGNOSIS — R1084 Generalized abdominal pain: Secondary | ICD-10-CM

## 2022-03-16 LAB — POCT URINALYSIS DIP (MANUAL ENTRY)
Glucose, UA: NEGATIVE mg/dL
Ketones, POC UA: NEGATIVE mg/dL
Nitrite, UA: NEGATIVE
Protein Ur, POC: 30 mg/dL — AB
Spec Grav, UA: >=1.03 — AB (ref 1.010–1.025)
Urobilinogen, UA: 1 E.U./dL
pH, UA: 7 (ref 5.0–8.0)

## 2022-03-16 LAB — POCT URINE PREGNANCY: Preg Test, Ur: NEGATIVE

## 2022-03-16 MED ORDER — NITROFURANTOIN MONOHYD MACRO 100 MG PO CAPS: 100.0000 mg | ORAL_CAPSULE | Freq: Two times a day (BID) | ORAL | 0 refills | Status: DC

## 2022-03-16 MED ORDER — DOXYCYCLINE HYCLATE 100 MG PO CAPS
100.0000 mg | ORAL_CAPSULE | Freq: Two times a day (BID) | ORAL | 0 refills | Status: DC
Start: 2022-03-16 — End: 2022-03-19

## 2022-03-16 MED ORDER — ONDANSETRON 4 MG PO TBDP: 4.0000 mg | ORAL_TABLET | Freq: Three times a day (TID) | ORAL | 0 refills | Status: DC | PRN

## 2022-03-16 NOTE — ED Triage Notes (Signed)
Pt c/o abd pain x 2 days. Pt was reluctant to help answer questions. States pain is "everywhere." States "I tried to take two abx but I can't keep the abx down it just comes back up."
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
## 2022-03-17 ENCOUNTER — Telehealth: Payer: Self-pay | Admitting: Physician Assistant

## 2022-03-17 DIAGNOSIS — N3000 Acute cystitis without hematuria: Secondary | ICD-10-CM | POA: Diagnosis present

## 2022-03-17 NOTE — Telephone Encounter (Signed)
Alternative order required for urine culture.

## 2022-03-18 ENCOUNTER — Other Ambulatory Visit (HOSPITAL_COMMUNITY)
Admission: RE | Admit: 2022-03-18 | Discharge: 2022-03-18 | Disposition: A | Discharge status: Home / Self Care | Payer: Medicaid Other | Source: Ambulatory Visit | Attending: Obstetrics and Gynecology | Admitting: Obstetrics and Gynecology

## 2022-03-18 DIAGNOSIS — N3000 Acute cystitis without hematuria: Secondary | ICD-10-CM | POA: Insufficient documentation

## 2022-03-18 LAB — CULTURE, OB URINE

## 2022-03-19 ENCOUNTER — Inpatient Hospital Stay (HOSPITAL_COMMUNITY)
Admission: AD | Admit: 2022-03-19 | Discharge: 2022-03-19 | Disposition: A | Discharge status: Home / Self Care | Payer: Medicaid Other | Attending: Obstetrics & Gynecology | Admitting: Obstetrics & Gynecology

## 2022-03-19 ENCOUNTER — Telehealth (HOSPITAL_BASED_OUTPATIENT_CLINIC_OR_DEPARTMENT_OTHER): Payer: Self-pay | Admitting: Advanced Practice Midwife

## 2022-03-19 DIAGNOSIS — J101 Influenza due to other identified influenza virus with other respiratory manifestations: Secondary | ICD-10-CM | POA: Insufficient documentation

## 2022-03-19 DIAGNOSIS — R6889 Other general symptoms and signs: Secondary | ICD-10-CM

## 2022-03-19 DIAGNOSIS — A749 Chlamydial infection, unspecified: Secondary | ICD-10-CM

## 2022-03-19 DIAGNOSIS — Z3202 Encounter for pregnancy test, result negative: Secondary | ICD-10-CM | POA: Insufficient documentation

## 2022-03-19 DIAGNOSIS — Z20822 Contact with and (suspected) exposure to covid-19: Secondary | ICD-10-CM | POA: Diagnosis not present

## 2022-03-19 DIAGNOSIS — U071 COVID-19: Secondary | ICD-10-CM

## 2022-03-19 LAB — POCT PREGNANCY, URINE: Preg Test, Ur: NEGATIVE

## 2022-03-19 LAB — HCG, QUANTITATIVE, PREGNANCY: hCG, Beta Chain, Quant, S: <1 m[IU]/mL (ref <5)

## 2022-03-19 LAB — RESP PANEL BY RT-PCR (RSV, FLU A&B, COVID)  RVPGX2
Influenza A by PCR: NEGATIVE
Influenza B by PCR: POSITIVE — AB
Resp Syncytial Virus by PCR: NEGATIVE
SARS Coronavirus 2 by RT PCR: NEGATIVE

## 2022-03-19 MED ORDER — PROMETHAZINE HCL 12.5 MG PO TABS: 12.5000 mg | ORAL_TABLET | Freq: Four times a day (QID) | ORAL | 0 refills | Status: DC | PRN

## 2022-03-19 MED ORDER — ONDANSETRON 4 MG PO TBDP
8.0000 mg | ORAL_TABLET | Freq: Once | ORAL | Status: AC
  Administered 2022-03-19: 8 mg via ORAL
  Filled 2022-03-19: qty 2

## 2022-03-19 MED ORDER — OSELTAMIVIR PHOSPHATE 75 MG PO CAPS: 75.0000 mg | ORAL_CAPSULE | Freq: Two times a day (BID) | ORAL | 0 refills | Status: AC

## 2022-03-19 MED ORDER — AZITHROMYCIN 250 MG PO TABS
1000.0000 mg | ORAL_TABLET | Freq: Once | ORAL | Status: AC
  Administered 2022-03-19: 1000 mg via ORAL
  Filled 2022-03-19: qty 4

## 2022-03-19 NOTE — MAU Provider Note (Signed)
Chief Complaint: Abdominal Pain, Emesis, and Nausea   Event Date/Time   First Provider Initiated Contact with Patient 03/19/22 2210      SUBJECTIVE HPI: Alexis [name] is a 27 y.o. [id number] with positive pregnancy tests at home x 4 days, negative test at Urgent Care on 03/16/22, with body aches, abdominal cramping, nasal congestion, chills, and n/v.   She was treated on 02/26/22 for chlamydia and gonorrhea. After the injection of Rhocephin, she was discharged with doxycyline but had trouble keeping down the pills. On 03/16/22, Urgent Care prescribed doxycycline again, which she is unable to tolerate.   She denies vaginal bleeding.  She has take Tylenol and Tylenol Cold without relief.   HPI  Past Medical History:  Diagnosis Date   Beta thalassemia trait    Family history of breast cancer in mother    Gonorrhea 12/2020   treated   Marijuana abuse    Seizures ([place])    last seizure in 2021   Shingles outbreak 10/2018   Trichomonas infection 02/2021   Past Surgical History:  Procedure Laterality Date   NO PAST SURGERIES     Social History   Socioeconomic History   Marital status: Single    Spouse name: Not on file   Number of children: Not on file   Years of education: Not on file   Highest education level: Not on file  Occupational History   Not on file  Tobacco Use   Smoking status: Every Day    Packs/day: 0.25    Types: Cigarettes    Passive exposure: Never   Smokeless tobacco: Never   Tobacco comments:    5=6 cigs a day  Vaping Use   Vaping Use: Never used  Substance and Sexual Activity   Alcohol use: No   Drug use: Not Currently    Types: Marijuana    Comment: last used a couple weeks ago   Sexual activity: Yes    Birth control/protection: None  Other Topics Concern   Not on file  Social History Narrative   Right handed   Lives with family two story home   Caffeine 1=3 cups daily   Social Determinants of Health   Financial Resource Strain: Low Risk   (04/19/2021)   Overall Financial Resource Strain (CARDIA)    Difficulty of Paying Living Expenses: Not very hard  Food Insecurity: No Food Insecurity (10/29/2021)   Hunger Vital Sign    Worried About Running Out of Food in the Last Year: Never true    Ran Out of Food in the Last Year: Never true  Transportation Needs: No Transportation Needs (10/29/2021)   PRAPARE - Hydrologist (Medical): No    Lack of Transportation (Non-Medical): No  Physical Activity: Insufficiently Active (04/19/2021)   Exercise Vital Sign    Days of Exercise per Week: 3 days    Minutes of Exercise per Session: 20 min  Stress: Stress Concern Present (04/19/2021)   [place]    Feeling of Stress : Very much  Social Connections: Moderately Integrated (04/19/2021)   Social Connection and Isolation Panel [NHANES]    Frequency of Communication with Friends and Family: Three times a week    Frequency of Social Gatherings with Friends and Family: Once a week    Attends Religious Services: More than 4 times per year    Active Member of [employer] or Organizations: No    Attends Archivist  Meetings: Never    Marital Status: Living with partner  Intimate Partner Violence: Not At Risk (10/29/2021)   Humiliation, Afraid, Rape, and Kick questionnaire    Fear of Current or Ex-Partner: No    Emotionally Abused: No    Physically Abused: No    Sexually Abused: No   Current Facility-Administered Medications on File Prior to Encounter  Medication Dose Route Frequency Provider Last Rate Last Admin   acetaminophen (TYLENOL) tablet 650 mg  650 mg Oral Once Jerene Bears, MD       acetaminophen (TYLENOL) tablet 650 mg  650 mg Oral Once Jerene Bears, MD       acetaminophen (TYLENOL) tablet 650 mg  650 mg Oral Once Jerene Bears, MD       acetaminophen (TYLENOL) tablet 650 mg  650 mg Oral Once Jerene Bears, MD       diphenhydrAMINE  (BENADRYL) capsule 25 mg  25 mg Oral Once Jerene Bears, MD       diphenhydrAMINE (BENADRYL) capsule 25 mg  25 mg Oral Once Jerene Bears, MD       diphenhydrAMINE (BENADRYL) capsule 25 mg  25 mg Oral Once Jerene Bears, MD       diphenhydrAMINE (BENADRYL) capsule 25 mg  25 mg Oral Once Jerene Bears, MD       iron sucrose (VENOFER) 200 mg in sodium chloride 0.9 % 100 mL IVPB  200 mg Intravenous Once Jerene Bears, MD       iron sucrose (VENOFER) 200 mg in sodium chloride 0.9 % 100 mL IVPB  200 mg Intravenous Once Jerene Bears, MD       Current Outpatient Medications on File Prior to Encounter  Medication Sig Dispense Refill   acetaminophen (TYLENOL) 325 MG tablet Take 2 tablets (650 mg total) by mouth every 4 (four) hours as needed (for pain scale < 4). 30 tablet 0   doxycycline (VIBRAMYCIN) 100 MG capsule Take 1 capsule (100 mg total) by mouth 2 (two) times daily. 20 capsule 0   ferrous sulfate 325 (65 FE) MG EC tablet Take 1 tablet (325 mg total) by mouth every other day. (Patient not taking: Reported on 10/26/2021) 30 tablet 2   ibuprofen (ADVIL) 600 MG tablet Take 1 tablet (600 mg total) by mouth every 6 (six) hours. 30 tablet 0   nitrofurantoin, macrocrystal-monohydrate, (MACROBID) 100 MG capsule Take 1 capsule (100 mg total) by mouth 2 (two) times daily. 10 capsule 0   ondansetron (ZOFRAN-ODT) 4 MG disintegrating tablet Take 1 tablet (4 mg total) by mouth every 8 (eight) hours as needed. 20 tablet 0   Prenatal Vit-Fe Fumarate-FA (PRENATAL PLUS VITAMIN/MINERAL) 27-1 MG TABS Take 1 tablet by mouth daily. 30 tablet 11   No Known Allergies  ROS:  Review of Systems  Constitutional:  Positive for chills. Negative for fatigue and fever.  HENT:  Positive for congestion.   Respiratory:  Negative for shortness of breath.   Cardiovascular:  Negative for chest pain.  Gastrointestinal:  Positive for abdominal pain, nausea and vomiting.  Genitourinary:  Negative for difficulty urinating,  dysuria, flank pain, pelvic pain, vaginal bleeding, vaginal discharge and vaginal pain.  Musculoskeletal:  Positive for myalgias.  Neurological:  Negative for dizziness and headaches.  Psychiatric/Behavioral: Negative.       I have reviewed patient's Past Medical Hx, Surgical Hx, Family Hx, Social Hx, medications and allergies.   Physical Exam  Patient Vitals for the past  24 hrs:  BP Temp Temp src Pulse Resp SpO2 Height Weight  03/19/22 2122 (!) 143/98 (!) 102 F (38.9 C) Oral (!) 113 19 100 % 5\' 4"  (1.626 m) 64 kg   Constitutional: Well-developed, well-nourished female in no acute distress.  Cardiovascular: normal rate Respiratory: normal effort GI: Abd soft, non-tender. Pos BS x 4 MS: Extremities nontender, no edema, normal ROM Neurologic: Alert and oriented x 4.  GU: Neg CVAT.  PELVIC EXAM: Deferred   LAB RESULTS Results for orders placed or performed during the hospital encounter of 03/19/22 (from the past 24 hour(s))  Pregnancy, urine POC     Status: None   Collection Time: 03/19/22  9:31 PM  Result Value Ref Range   Preg Test, Ur NEGATIVE NEGATIVE    --/--/O POS (09/08 1311)  IMAGING   MAU Management/MDM: Orders Placed This Encounter  Procedures   Resp panel by RT-PCR (RSV, Flu A&B, Covid) Anterior Nasal Swab   hCG, quantitative, pregnancy   Airborne and Contact precautions   Pregnancy, urine POC   Discharge patient    Meds ordered this encounter  Medications   azithromycin (ZITHROMAX) tablet 1,000 mg   promethazine (PHENERGAN) 12.5 MG tablet    Sig: Take 1 tablet (12.5 mg total) by mouth every 6 (six) hours as needed for nausea or vomiting. You may place the medication vaginally or rectally if you cannot keep it down by mouth.    Dispense:  30 tablet    Refill:  0    Order Specific Question:   Supervising Provider    Answer:   10-09-2001 A [[id number]]   oseltamivir (TAMIFLU) 75 MG capsule    Sig: Take 1 capsule (75 mg total) by mouth 2 (two) times  daily for 5 days.    Dispense:  10 capsule    Refill:  0    Order Specific Question:   Supervising Provider    Answer:   Jaynie Collins A [[id number]]   ondansetron (ZOFRAN-ODT) disintegrating tablet 8 mg    UPT negative in MAU today, stat hcg ordered. Given pt symptoms, nasal swab for flu/COVID/RSV collected.  Pt also treated for chlamydia with azithromycin 1000 mg PO x 1 dose with Zofran ODT in MAU today since she has failed 2 courses of doxycycline due to n/v.     Pt given option of going to ED tonight for further evaluation of symptoms in the setting of negative urine pregnancy test.  Pt opts to discharge to home but will return if hcg >5 for further pregnancy evaluation.     ASSESSMENT 1. Flu-like symptoms   2. Urine pregnancy test negative   3. Chlamydia     PLAN Discharge home Allergies as of 03/19/2022   No Known Allergies      Medication List     STOP taking these medications    doxycycline 100 MG capsule Commonly known as: VIBRAMYCIN   ibuprofen 600 MG tablet Commonly known as: ADVIL   nitrofurantoin (macrocrystal-monohydrate) 100 MG capsule Commonly known as: MACROBID       TAKE these medications    acetaminophen 325 MG tablet Commonly known as: Tylenol Take 2 tablets (650 mg total) by mouth every 4 (four) hours as needed (for pain scale < 4).   ferrous sulfate 325 (65 FE) MG EC tablet Take 1 tablet (325 mg total) by mouth every other day.   ondansetron 4 MG disintegrating tablet Commonly known as: ZOFRAN-ODT Take 1 tablet (4 mg total) by mouth every 8 (  eight) hours as needed.   oseltamivir 75 MG capsule Commonly known as: TAMIFLU Take 1 capsule (75 mg total) by mouth 2 (two) times daily for 5 days.   Prenatal Plus Vitamin/Mineral 27-1 MG Tabs Take 1 tablet by mouth daily.   promethazine 12.5 MG tablet Commonly known as: PHENERGAN Take 1 tablet (12.5 mg total) by mouth every 6 (six) hours as needed for nausea or vomiting. You may place the  medication vaginally or rectally if you cannot keep it down by mouth.         Fatima Blank Certified Nurse-Midwife 03/19/2022  10:27 PM

## 2022-03-19 NOTE — MAU Note (Signed)
.  Alexis [name] is a 27 y.o. at Unknown here in MAU reporting: went to urgent care a few days ago - neg. Pregnancy test, but has had positive home tests including one today. C/o N/V for the past couple days - can't keep anything down. Was prescribed zofran, but cannot keep it down. Constant right sided abdominal pain that starts low and goes up. Denies VB or abnormal discharge.  LMP: 01/22/2022 Onset of complaint: 3 days  Pain score: 10 Vitals:   03/19/22 2122  BP: (!) 143/98  Pulse: (!) 113  Resp: 19  Temp: (!) 102 F (38.9 C)  SpO2: 100%     FHT:NA Lab orders placed from triage:  poc - pregnancy

## 2022-03-20 MED ORDER — PAXLOVID (300/100) 20 X 150 MG & 10 X 100MG PO TBPK: ORAL_TABLET | ORAL | 0 refills | Status: AC

## 2022-03-20 NOTE — Telephone Encounter (Signed)
Called pt to notify her of lab results from MAU visit.  Her hcg level was <1, so no recent pregnancy, but nasal swab positive for influenza B and Covid.  Pt already picked up her Tamiflu and Phenergan Rx from her 24 hour pharmacy, and I added Paxlovid Rx for her which she plans to pick up tonight as well.  Warning signs, reasons to seek care reviewed. Pt 43 month old experiencing symptoms. I recommended she call the baby's doctor as well, as there is usually someone on call for the office to provide advice for baby.

## 2022-03-21 ENCOUNTER — Encounter (HOSPITAL_BASED_OUTPATIENT_CLINIC_OR_DEPARTMENT_OTHER): Payer: Self-pay | Admitting: Advanced Practice Midwife

## 2022-04-03 IMAGING — DX DG CHEST 1V PORT
1 series · 1 of 1 positions shown · non-contrast
Comparison: Chest x-ray 11/28/2019

CLINICAL DATA: Shortness of breath.

EXAM:
PORTABLE CHEST 1 VIEW

[chest ap]
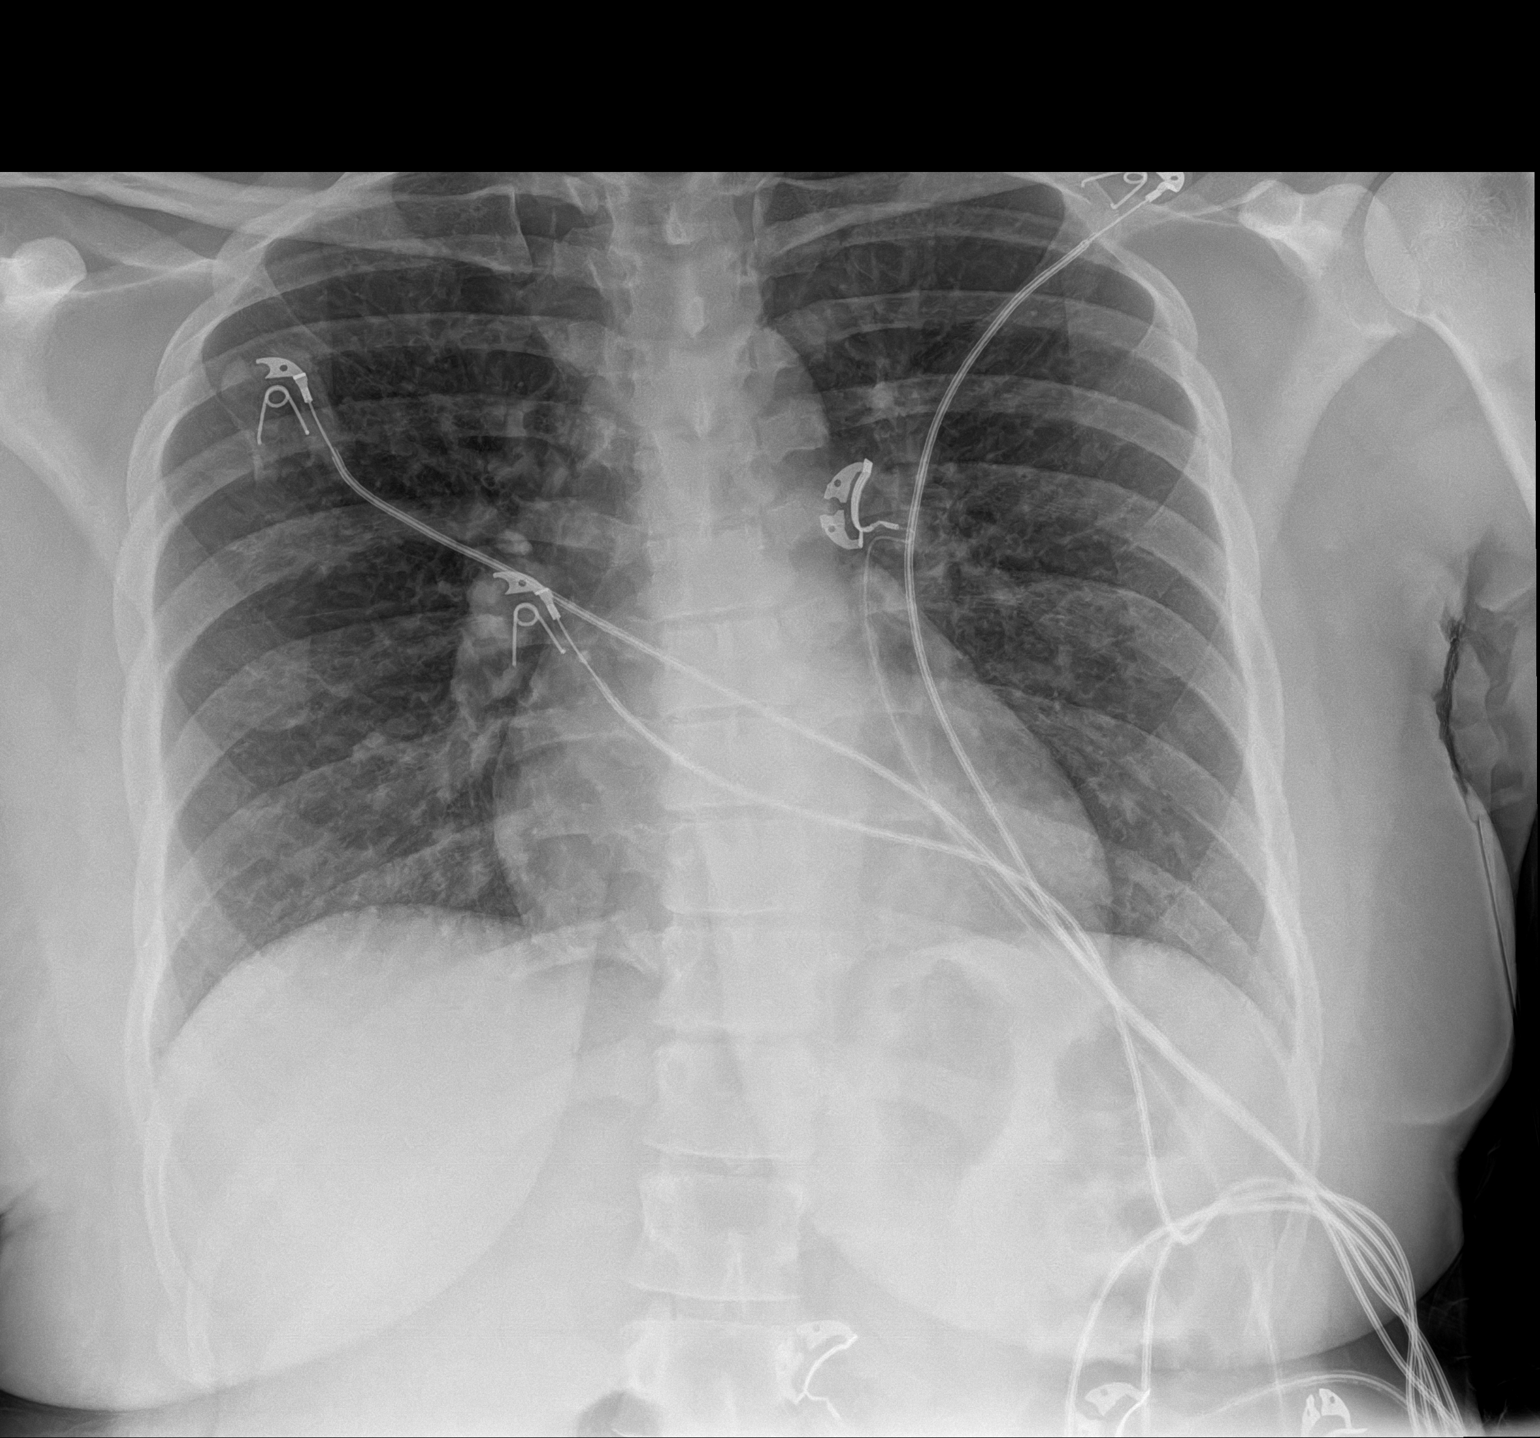

[1 of 1 positions shown; findings below may reference images not displayed]

FINDINGS: The heart size and mediastinal contours are within normal limits.

Density overlying the right peripheral upper lung likely related to
overlying cardiac lead. No focal consolidation. No pulmonary edema.
No pleural effusion. No pneumothorax.

No acute osseous abnormality.
IMPRESSION: No active disease.

## 2022-04-07 ENCOUNTER — Telehealth: Payer: Medicaid Other | Admitting: Family Medicine

## 2022-04-07 DIAGNOSIS — H00015 Hordeolum externum left lower eyelid: Secondary | ICD-10-CM

## 2022-04-07 MED ORDER — ERYTHROMYCIN 5 MG/GM OP OINT: 1.0000 | TOPICAL_OINTMENT | Freq: Three times a day (TID) | OPHTHALMIC | 0 refills | Status: AC

## 2022-04-07 NOTE — Patient Instructions (Signed)
Alexis [name], thank you for joining Perlie Mayo, NP for today's virtual visit.  While this provider is not your primary care provider (PCP), if your PCP is located in our provider database this encounter information will be shared with them immediately following your visit.   [facility] account gives you access to today's visit and all your visits, tests, and labs performed at [facility] " click here if you don't have a [facility] account or go to [email or web address]  Consent: (Patient) Alexis [name] provided verbal consent for this virtual visit at the beginning of the encounter.  Current Medications:  Current Outpatient Medications:    erythromycin ophthalmic ointment, Place 1 Application into the left eye 3 (three) times daily for 5 days., Disp: 15 g, Rfl: 0   acetaminophen (TYLENOL) 325 MG tablet, Take 2 tablets (650 mg total) by mouth every 4 (four) hours as needed (for pain scale < 4)., Disp: 30 tablet, Rfl: 0   ferrous sulfate 325 (65 FE) MG EC tablet, Take 1 tablet (325 mg total) by mouth every other day. (Patient not taking: Reported on 10/26/2021), Disp: 30 tablet, Rfl: 2   ondansetron (ZOFRAN-ODT) 4 MG disintegrating tablet, Take 1 tablet (4 mg total) by mouth every 8 (eight) hours as needed., Disp: 20 tablet, Rfl: 0   Prenatal Vit-Fe Fumarate-FA (PRENATAL PLUS VITAMIN/MINERAL) 27-1 MG TABS, Take 1 tablet by mouth daily., Disp: 30 tablet, Rfl: 11   promethazine (PHENERGAN) 12.5 MG tablet, Take 1 tablet (12.5 mg total) by mouth every 6 (six) hours as needed for nausea or vomiting. You may place the medication vaginally or rectally if you cannot keep it down by mouth., Disp: 30 tablet, Rfl: 0 No current facility-administered medications for this visit.  Facility-Administered Medications Ordered in Other Visits:    acetaminophen (TYLENOL) tablet 650 mg, 650 mg, Oral, Once, Megan Salon, MD   acetaminophen (TYLENOL) tablet 650 mg, 650  mg, Oral, Once, Megan Salon, MD   acetaminophen (TYLENOL) tablet 650 mg, 650 mg, Oral, Once, Megan Salon, MD   acetaminophen (TYLENOL) tablet 650 mg, 650 mg, Oral, Once, Megan Salon, MD   diphenhydrAMINE (BENADRYL) capsule 25 mg, 25 mg, Oral, Once, Megan Salon, MD   diphenhydrAMINE (BENADRYL) capsule 25 mg, 25 mg, Oral, Once, Megan Salon, MD   diphenhydrAMINE (BENADRYL) capsule 25 mg, 25 mg, Oral, Once, Megan Salon, MD   diphenhydrAMINE (BENADRYL) capsule 25 mg, 25 mg, Oral, Once, Megan Salon, MD   iron sucrose (VENOFER) 200 mg in sodium chloride 0.9 % 100 mL IVPB, 200 mg, Intravenous, Once, Megan Salon, MD   iron sucrose (VENOFER) 200 mg in sodium chloride 0.9 % 100 mL IVPB, 200 mg, Intravenous, Once, Megan Salon, MD   Medications ordered in this encounter:  Meds ordered this encounter  Medications   erythromycin ophthalmic ointment    Sig: Place 1 Application into the left eye 3 (three) times daily for 5 days.    Dispense:  15 g    Refill:  0    Order Specific Question:   Supervising Provider    Answer:   Chase Picket [id number]     *If you need refills on other medications prior to your next appointment, please contact your pharmacy*  Follow-Up: Call back or seek an in-person evaluation if the symptoms worsen or if the condition fails to improve as anticipated.  [place] [phone]  Other Instructions  Stye A stye, also known as a hordeolum, is a bump that forms on an eyelid. It may look like a pimple next to the eyelash. A stye can form inside the eyelid (internal stye) or outside the eyelid (external stye). A stye can cause redness, swelling, and pain on the eyelid. Styes are very common. Anyone can get them at any age. They usually occur in just one eye at a time, but you may have more than one in either eye. What are the causes? A stye is caused by an infection. The infection is almost always caused by bacteria called  Staphylococcus aureus. This is a common type of bacteria that lives on the skin. An internal stye may result from an infected oil-producing gland inside the eyelid. An external stye may be caused by an infection at the base of the eyelash (hair follicle). What increases the risk? You are more likely to develop a stye if: You have had a stye before. You have any of these conditions: Red, itchy, inflamed eyelids (blepharitis). A skin condition such as seborrheic dermatitis or rosacea. High fat levels in your blood (lipids). Dry eyes. What are the signs or symptoms? The most common symptom of a stye is eyelid pain. Internal styes are more painful than external styes. Other symptoms may include: Painful swelling of your eyelid. A scratchy feeling in your eye. Tearing and redness of your eye. A pimple-like bump on the edge of the eyelid. Pus draining from the stye. How is this diagnosed? Your health care provider may be able to diagnose a stye just by examining your eye. The health care provider may also check to make sure: You do not have a fever or other signs of a more serious infection. The infection has not spread to other parts of your eye or areas around your eye. How is this treated? Most styes will clear up in a few days without treatment or with warm compresses applied to the area. You may need to use antibiotic drops or ointment to treat an infection. Sometimes, steroid drops or ointment are used in addition to antibiotics. In some cases, your health care provider may give you a small steroid injection in the eyelid. If your stye does not heal with routine treatment, your health care provider may drain pus from the stye using a thin blade or needle. This may be done if the stye is large, causing a lot of pain, or affecting your vision. Follow these instructions at home: Take over-the-counter and prescription medicines only as told by your health care provider. This includes eye drops  or ointments. If you were prescribed an antibiotic medicine, steroid medicine, or both, apply or use them as told by your health care provider. Do not stop using the medicine even if your condition improves. Apply a warm, wet cloth (warm compress) to your eye for 5-10 minutes, 4 to 6 times a day. Clean the affected eyelid as directed by your health care provider. Do not wear contact lenses or eye makeup until your stye has healed and your health care provider says that it is safe. Do not try to pop or drain the stye. Do not rub your eye. Contact a health care provider if: You have chills or a fever. Your stye does not go away after several days. Your stye affects your vision. Your eyeball becomes swollen, red, or painful. Get help right away if: You have pain when moving your eye around. Summary A stye is a bump that  forms on an eyelid. It may look like a pimple next to the eyelash. A stye can form inside the eyelid (internal stye) or outside the eyelid (external stye). A stye can cause redness, swelling, and pain on the eyelid. Your health care provider may be able to diagnose a stye just by examining your eye. Apply a warm, wet cloth (warm compress) to your eye for 5-10 minutes, 4 to 6 times a day. This information is not intended to replace advice given to you by your health care provider. Make sure you discuss any questions you have with your health care provider. Document Revised: 04/15/2020 Document Reviewed: 04/15/2020 Elsevier Patient Education  [place].    If you have been instructed to have an in-person evaluation today at a local Urgent Care facility, please use the link below. It will take you to a list of all of our available [facility], including address, phone number and hours of operation. Please do not delay care.  [facility]  If you or a family member do not have a primary care provider, use the link below to schedule a visit and  establish care. When you choose a [facility] [place] primary care physician or advanced practice provider, you gain a long-term partner in health. Find a Primary Care Provider  Learn more about [facility]'s in-office and virtual care options: [facility]
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
## 2022-04-26 ENCOUNTER — Telehealth: Payer: Medicaid Other | Admitting: Physician Assistant

## 2022-04-26 DIAGNOSIS — H00014 Hordeolum externum left upper eyelid: Secondary | ICD-10-CM | POA: Diagnosis not present
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
## 2022-04-26 NOTE — Patient Instructions (Signed)
Alexis [name], thank you for joining [place], PA-C for today's virtual visit.  While this provider is not your primary care provider (PCP), if your PCP is located in our provider database this encounter information will be shared with them immediately following your visit.   [facility] account gives you access to today's visit and all your visits, tests, and labs performed at [facility] " click here if you don't have a [facility] account or go to [email or web address]  Consent: (Patient) Alexis [name] provided verbal consent for this virtual visit at the beginning of the encounter.  Current Medications:  Current Outpatient Medications:    acetaminophen (TYLENOL) 325 MG tablet, Take 2 tablets (650 mg total) by mouth every 4 (four) hours as needed (for pain scale < 4)., Disp: 30 tablet, Rfl: 0   ferrous sulfate 325 (65 FE) MG EC tablet, Take 1 tablet (325 mg total) by mouth every other day. (Patient not taking: Reported on 10/26/2021), Disp: 30 tablet, Rfl: 2   ondansetron (ZOFRAN-ODT) 4 MG disintegrating tablet, Take 1 tablet (4 mg total) by mouth every 8 (eight) hours as needed., Disp: 20 tablet, Rfl: 0   Prenatal Vit-Fe Fumarate-FA (PRENATAL PLUS VITAMIN/MINERAL) 27-1 MG TABS, Take 1 tablet by mouth daily., Disp: 30 tablet, Rfl: 11   promethazine (PHENERGAN) 12.5 MG tablet, Take 1 tablet (12.5 mg total) by mouth every 6 (six) hours as needed for nausea or vomiting. You may place the medication vaginally or rectally if you cannot keep it down by mouth., Disp: 30 tablet, Rfl: 0 No current facility-administered medications for this visit.  Facility-Administered Medications Ordered in Other Visits:    acetaminophen (TYLENOL) tablet 650 mg, 650 mg, Oral, Once, Megan Salon, MD   acetaminophen (TYLENOL) tablet 650 mg, 650 mg, Oral, Once, Megan Salon, MD   acetaminophen (TYLENOL) tablet 650 mg, 650 mg, Oral, Once, Megan Salon, MD    acetaminophen (TYLENOL) tablet 650 mg, 650 mg, Oral, Once, Megan Salon, MD   diphenhydrAMINE (BENADRYL) capsule 25 mg, 25 mg, Oral, Once, Megan Salon, MD   diphenhydrAMINE (BENADRYL) capsule 25 mg, 25 mg, Oral, Once, Megan Salon, MD   diphenhydrAMINE (BENADRYL) capsule 25 mg, 25 mg, Oral, Once, Megan Salon, MD   diphenhydrAMINE (BENADRYL) capsule 25 mg, 25 mg, Oral, Once, Megan Salon, MD   iron sucrose (VENOFER) 200 mg in sodium chloride 0.9 % 100 mL IVPB, 200 mg, Intravenous, Once, Megan Salon, MD   iron sucrose (VENOFER) 200 mg in sodium chloride 0.9 % 100 mL IVPB, 200 mg, Intravenous, Once, Megan Salon, MD   Medications ordered in this encounter:  No orders of the defined types were placed in this encounter.    *If you need refills on other medications prior to your next appointment, please contact your pharmacy*  Follow-Up: Call back or seek an in-person evaluation if the symptoms worsen or if the condition fails to improve as anticipated.  [facility] [phone]  Other Instructions Recommend warm compress to affected eye.  Discontinue all eye makeup.  Follow up with primary care physician if needed.    If you have been instructed to have an in-person evaluation today at a local Urgent Care facility, please use the link below. It will take you to a list of all of our available [facility], including address, phone number and hours of operation. Please do not delay care.  [facility]  If you or a family member do not have a primary care provider, use the link below to schedule a visit and establish care. When you choose a [facility] primary care physician or advanced practice provider, you gain a long-term partner in health. Find a Primary Care Provider  Learn more about [facility]'s in-office and virtual care options:  - Get Care Now  

## 2022-05-09 ENCOUNTER — Ambulatory Visit (INDEPENDENT_AMBULATORY_CARE_PROVIDER_SITE_OTHER): Payer: Medicaid Other | Admitting: Obstetrics & Gynecology

## 2022-05-09 ENCOUNTER — Other Ambulatory Visit (INDEPENDENT_AMBULATORY_CARE_PROVIDER_SITE_OTHER): Payer: Medicaid Other

## 2022-05-09 ENCOUNTER — Ambulatory Visit (INDEPENDENT_AMBULATORY_CARE_PROVIDER_SITE_OTHER): Payer: Medicaid Other | Admitting: *Deleted

## 2022-05-09 ENCOUNTER — Encounter (HOSPITAL_BASED_OUTPATIENT_CLINIC_OR_DEPARTMENT_OTHER): Payer: Self-pay

## 2022-05-09 ENCOUNTER — Other Ambulatory Visit (HOSPITAL_COMMUNITY)
Admission: RE | Admit: 2022-05-09 | Discharge: 2022-05-09 | Disposition: A | Discharge status: Home / Self Care | Payer: Medicaid Other | Source: Ambulatory Visit | Attending: Obstetrics & Gynecology | Admitting: Obstetrics & Gynecology

## 2022-05-09 VITALS — BP 114/72 | HR 92 | Wt 149.0 lb

## 2022-05-09 DIAGNOSIS — Z3A00 Weeks of gestation of pregnancy not specified: Secondary | ICD-10-CM

## 2022-05-09 DIAGNOSIS — O26841 Uterine size-date discrepancy, first trimester: Secondary | ICD-10-CM | POA: Diagnosis not present

## 2022-05-09 DIAGNOSIS — Z3201 Encounter for pregnancy test, result positive: Secondary | ICD-10-CM | POA: Insufficient documentation

## 2022-05-09 DIAGNOSIS — N926 Irregular menstruation, unspecified: Secondary | ICD-10-CM | POA: Insufficient documentation

## 2022-05-09 DIAGNOSIS — O3680X0 Pregnancy with inconclusive fetal viability, not applicable or unspecified: Secondary | ICD-10-CM | POA: Diagnosis not present

## 2022-05-09 DIAGNOSIS — R87612 Low grade squamous intraepithelial lesion on cytologic smear of cervix (LGSIL): Secondary | ICD-10-CM | POA: Diagnosis not present

## 2022-05-09 DIAGNOSIS — Z3689 Encounter for other specified antenatal screening: Secondary | ICD-10-CM
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
## 2022-05-10 ENCOUNTER — Encounter (HOSPITAL_BASED_OUTPATIENT_CLINIC_OR_DEPARTMENT_OTHER): Payer: Self-pay | Admitting: Obstetrics & Gynecology

## 2022-05-10 LAB — ABO/RH: Rh Factor: POSITIVE

## 2022-05-10 LAB — BETA HCG QUANT (REF LAB): hCG Quant: 3473 m[IU]/mL

## 2022-05-10 NOTE — Progress Notes (Addendum)
GYNECOLOGY  VISIT  CC:   confirmation of pregnancy  HPI: 27 y.o. [id number] Single Black or African American female here for confirmation of pregnancy.  Unsure about dating.  Having mild nausea.  Otherwise feels well.  Last cycle was in January but her cycles are irregular.  Was going to have tubal ligation done after last delivery but got scared and didn't go and have it done.  Really was not planning another pregnancy.  Does have hx of abnormal pap smear.  Does need updating today.  H/o iron deficiency anemia.  Not taking iron or vitamins at this time.  Living situation is more stable as she is back living with her mother again.   Past Medical History:  Diagnosis Date   Beta thalassemia trait    Family history of breast cancer in mother    Gonorrhea 12/2020   treated   Marijuana abuse    Seizures ([place])    last seizure in 2021   Shingles outbreak 10/2018   Trichomonas infection 02/2021    MEDS:   Current Outpatient Medications on File Prior to Visit  Medication Sig Dispense Refill   acetaminophen (TYLENOL) 325 MG tablet Take 2 tablets (650 mg total) by mouth every 4 (four) hours as needed (for pain scale < 4). 30 tablet 0   ferrous sulfate 325 (65 FE) MG EC tablet Take 1 tablet (325 mg total) by mouth every other day. (Patient not taking: Reported on 10/26/2021) 30 tablet 2   Prenatal Vit-Fe Fumarate-FA (PRENATAL PLUS VITAMIN/MINERAL) 27-1 MG TABS Take 1 tablet by mouth daily. (Patient not taking: Reported on 05/09/2022) 30 tablet 11   No current facility-administered medications on file prior to visit.    ALLERGIES: Patient has no known allergies.  SH:  single, smoking, smokes marijuana but stopped when had pregnancy test  Review of Systems  Constitutional: Negative.   Genitourinary: Negative.     PHYSICAL EXAMINATION:    BP 114/72   Pulse 92   Wt 149 lb (67.6 kg)   LMP 02/25/2022 (Approximate)   BMI 25.58 kg/m     General appearance: alert, cooperative and appears  stated age Lymph:  no inguinal LAD noted  Pelvic: External genitalia:  no lesions              Urethra:  normal appearing urethra with no masses, tenderness or lesions              Bartholins and Skenes: normal                 Vagina: normal appearing vagina with normal color and discharge, no lesions              Cervix: no lesions              Bimanual Exam:  Uterus:  enlarged, about 10 weeks size              Adnexa: no mass, fullness, tenderness              Anus:  no lesions  Ultrasound done for confirmation of dating.  Single gestational sac and yolk sac noted but no fetal pole seen.  Documentation done separately.  Concerns for failed pregnancy discussed with pt.  Chaperone, Ezekiel Ina, RN, was present for exam.  Assessment/Plan: 1. Missed period - ABO/Rh - Cytology - PAP( Flowella) - Urine Culture - Beta hCG quant (ref lab)  2. Positive pregnancy test  3.  Uterine size dates discrepancy -  pt is going to have the above blood work today and then will return for ultrasound in 2 weeks  4.  LGSIL pap smear - pap smear obtained today

## 2022-05-11 LAB — URINE CULTURE

## 2022-05-13 LAB — CYTOLOGY - PAP
Chlamydia: POSITIVE — AB
Comment: NEGATIVE
Comment: NORMAL
Diagnosis: NEGATIVE
Diagnosis: REACTIVE
Neisseria Gonorrhea: POSITIVE — AB

## 2022-05-15 ENCOUNTER — Encounter (HOSPITAL_BASED_OUTPATIENT_CLINIC_OR_DEPARTMENT_OTHER): Payer: Self-pay | Admitting: Obstetrics & Gynecology

## 2022-05-15 DIAGNOSIS — A749 Chlamydial infection, unspecified: Secondary | ICD-10-CM | POA: Insufficient documentation

## 2022-05-15 DIAGNOSIS — A549 Gonococcal infection, unspecified: Secondary | ICD-10-CM | POA: Insufficient documentation

## 2022-05-16 ENCOUNTER — Telehealth: Payer: Medicaid Other | Admitting: Physician Assistant

## 2022-05-16 DIAGNOSIS — J020 Streptococcal pharyngitis: Secondary | ICD-10-CM | POA: Diagnosis not present

## 2022-05-16 MED ORDER — LIDOCAINE VISCOUS HCL 2 % MT SOLN: OROMUCOSAL | 0 refills | Status: DC

## 2022-05-16 MED ORDER — AMOXICILLIN 500 MG PO CAPS: 500.0000 mg | ORAL_CAPSULE | Freq: Two times a day (BID) | ORAL | 0 refills | Status: AC

## 2022-05-16 NOTE — Patient Instructions (Signed)
Alexis [name], thank you for joining Mar Daring, PA-C for today's virtual visit.  While this provider is not your primary care provider (PCP), if your PCP is located in our provider database this encounter information will be shared with them immediately following your visit.   [facility] account gives you access to today's visit and all your visits, tests, and labs performed at [facility] " click here if you don't have a [facility] account or go to [email or web address]  Consent: (Patient) Alexis [name] provided verbal consent for this virtual visit at the beginning of the encounter.  Current Medications:  Current Outpatient Medications:    amoxicillin (AMOXIL) 500 MG capsule, Take 1 capsule (500 mg total) by mouth 2 (two) times daily for 10 days., Disp: 20 capsule, Rfl: 0   lidocaine (XYLOCAINE) 2 % solution, Swallow 43mL every 6 hours as needed for sore throat, Disp: 100 mL, Rfl: 0   acetaminophen (TYLENOL) 325 MG tablet, Take 2 tablets (650 mg total) by mouth every 4 (four) hours as needed (for pain scale < 4)., Disp: 30 tablet, Rfl: 0   ferrous sulfate 325 (65 FE) MG EC tablet, Take 1 tablet (325 mg total) by mouth every other day. (Patient not taking: Reported on 10/26/2021), Disp: 30 tablet, Rfl: 2   Prenatal Vit-Fe Fumarate-FA (PRENATAL PLUS VITAMIN/MINERAL) 27-1 MG TABS, Take 1 tablet by mouth daily. (Patient not taking: Reported on 05/09/2022), Disp: 30 tablet, Rfl: 11   Medications ordered in this encounter:  Meds ordered this encounter  Medications   amoxicillin (AMOXIL) 500 MG capsule    Sig: Take 1 capsule (500 mg total) by mouth 2 (two) times daily for 10 days.    Dispense:  20 capsule    Refill:  0    Order Specific Question:   Supervising Provider    Answer:   Chase Picket [id number]   lidocaine (XYLOCAINE) 2 % solution    Sig: Swallow 67mL every 6 hours as needed for sore throat    Dispense:  100 mL    Refill:  0     Order Specific Question:   Supervising Provider    Answer:   Chase Picket [id number]     *If you need refills on other medications prior to your next appointment, please contact your pharmacy*  Follow-Up: Call back or seek an in-person evaluation if the symptoms worsen or if the condition fails to improve as anticipated.  [facility] [phone]  Other Instructions  Strep Throat, Adult Strep throat is an infection in the throat that is caused by bacteria. It is common during the cold months of the year. It mostly affects children who are 59-61 years old. However, people of all ages can get it at any time of the year. This infection spreads from person to person (is contagious) through coughing, sneezing, or having close contact. Your health care provider may use other names to describe the infection. When strep throat affects the tonsils, it is called tonsillitis. When it affects the back of the throat, it is called pharyngitis. What are the causes? This condition is caused by the Streptococcus pyogenes bacteria. What increases the risk? You are more likely to develop this condition if: You care for school-age children, or are around school-age children. Children are more likely to get strep throat and may spread it to others. You spend time in crowded places where the infection can spread easily. You have close contact  with someone who has strep throat. What are the signs or symptoms? Symptoms of this condition include: Fever or chills. Redness, swelling, or pain in the tonsils or throat. Pain or difficulty when swallowing. White or yellow spots on the tonsils or throat. Tender glands in the neck and under the jaw. Bad smelling breath. Red rash all over the body. This is rare. How is this diagnosed? This condition is diagnosed by tests that check for the presence and the amount of bacteria that cause strep throat. They are: Rapid strep test. Your throat is  swabbed and checked for the presence of bacteria. Results are usually ready in minutes. Throat culture test. Your throat is swabbed. The sample is placed in a cup that allows infections to grow. Results are usually ready in 1 or 2 days. How is this treated? This condition may be treated with: Medicines that kill germs (antibiotics). Medicines that relieve pain or fever. These include: Ibuprofen or acetaminophen. Aspirin, only for people who are over the age of 17. Throat lozenges. Throat sprays. Follow these instructions at home: Medicines  Take over-the-counter and prescription medicines only as told by your health care provider. Take your antibiotic medicine as told by your health care provider. Do not stop taking the antibiotic even if you start to feel better. Eating and drinking  If you have trouble swallowing, try eating soft foods until your sore throat feels better. Drink enough fluid to keep your urine pale yellow. To help relieve pain, you may have: Warm fluids, such as soup and tea. Cold fluids, such as frozen desserts or popsicles. General instructions Gargle with a salt-water mixture 3-4 times a day or as needed. To make a salt-water mixture, completely dissolve -1 tsp (3-6 g) of salt in 1 cup (237 mL) of warm water. Get plenty of rest. Stay home from work or school until you have been taking antibiotics for 24 hours. Do not use any products that contain nicotine or tobacco. These products include cigarettes, chewing tobacco, and vaping devices, such as e-cigarettes. If you need help quitting, ask your health care provider. It is up to you to get your test results. Ask your health care provider, or the department that is doing the test, when your results will be ready. Keep all follow-up visits. This is important. How is this prevented?  Do not share food, drinking cups, or personal items that could cause the infection to spread to other people. Wash your hands often  with soap and water for at least 20 seconds. If soap and water are not available, use hand sanitizer. Make sure that all people in your house wash their hands well. Have family members tested if they have a sore throat or fever. They may need an antibiotic if they have strep throat. Contact a health care provider if: You have swelling in your neck that keeps getting bigger. You develop a rash, cough, or earache. You cough up a thick mucus that is green, yellow-brown, or bloody. You have pain or discomfort that does not get better with medicine. Your symptoms seem to be getting worse. You have a fever. Get help right away if: You have new symptoms, such as vomiting, severe headache, stiff or painful neck, chest pain, or shortness of breath. You have severe throat pain, drooling, or changes in your voice. You have swelling of the neck, or the skin on the neck becomes red and tender. You have signs of dehydration, such as tiredness (fatigue), dry mouth, and decreased  urination. You become increasingly sleepy, or you cannot wake up completely. Your joints become red or painful. These symptoms may represent a serious problem that is an emergency. Do not wait to see if the symptoms will go away. Get medical help right away. Call your local emergency services (911 in the [place] Do not drive yourself to the hospital. Summary Strep throat is an infection in the throat that is caused by the Streptococcus pyogenes bacteria. This infection is spread from person to person (is contagious) through coughing, sneezing, or having close contact. Take your medicines, including antibiotics, as told by your health care provider. Do not stop taking the antibiotic even if you start to feel better. To prevent the spread of germs, wash your hands well with soap and water. Have others do the same. Do not share food, drinking cups, or personal items. Get help right away if you have new symptoms, such as vomiting, severe  headache, stiff or painful neck, chest pain, or shortness of breath. This information is not intended to replace advice given to you by your health care provider. Make sure you discuss any questions you have with your health care provider. Document Revised: 06/02/2020 Document Reviewed: 06/02/2020 Elsevier Patient Education  [place].    If you have been instructed to have an in-person evaluation today at a local Urgent Care facility, please use the link below. It will take you to a list of all of our available [facility], including address, phone number and hours of operation. Please do not delay care.  [facility]  If you or a family member do not have a primary care provider, use the link below to schedule a visit and establish care. When you choose a [facility] primary care physician or advanced practice provider, you gain a long-term partner in health. Find a Primary Care Provider  Learn more about [facility]'s in-office and virtual care options: [facility]
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
## 2022-05-24 ENCOUNTER — Ambulatory Visit (HOSPITAL_BASED_OUTPATIENT_CLINIC_OR_DEPARTMENT_OTHER): Payer: Medicaid Other | Admitting: Advanced Practice Midwife

## 2022-05-25 ENCOUNTER — Encounter (HOSPITAL_BASED_OUTPATIENT_CLINIC_OR_DEPARTMENT_OTHER): Payer: Self-pay | Admitting: Advanced Practice Midwife

## 2022-05-27 ENCOUNTER — Ambulatory Visit (HOSPITAL_BASED_OUTPATIENT_CLINIC_OR_DEPARTMENT_OTHER): Payer: Medicaid Other | Admitting: Obstetrics & Gynecology

## 2022-05-27 ENCOUNTER — Encounter (HOSPITAL_BASED_OUTPATIENT_CLINIC_OR_DEPARTMENT_OTHER): Payer: Self-pay

## 2022-05-27 ENCOUNTER — Other Ambulatory Visit (HOSPITAL_BASED_OUTPATIENT_CLINIC_OR_DEPARTMENT_OTHER): Payer: Self-pay | Admitting: Obstetrics & Gynecology

## 2022-05-27 MED ORDER — PROMETHAZINE HCL 12.5 MG PO TABS: 12.5000 mg | ORAL_TABLET | Freq: Four times a day (QID) | ORAL | 0 refills | Status: DC | PRN

## 2022-05-27 MED ORDER — AZITHROMYCIN 1 G PO PACK: 1.0000 | PACK | Freq: Once | ORAL | 0 refills | Status: AC

## 2022-05-27 NOTE — Progress Notes (Signed)
The phone number in the chart was called and Alexis [name]'s mother answered the phone.  She provided [place] with another number to reach her.  The patient did not show up for her apt again today.  Dr Hyacinth Meeker called the number that her mom provided and reached her.  Advised her of the results and advised to keep the next scheduled apt to see her in order to receive treatment.  Zithromax prescription was sent into the pharmacy and the patient agreed to pick it up.  Apt was scheduled for patient to come in next week.  The results were sent to the [facility].  KW CMA

## 2022-05-31 ENCOUNTER — Encounter (HOSPITAL_BASED_OUTPATIENT_CLINIC_OR_DEPARTMENT_OTHER): Payer: Self-pay | Admitting: Obstetrics & Gynecology

## 2022-06-02 ENCOUNTER — Encounter (HOSPITAL_BASED_OUTPATIENT_CLINIC_OR_DEPARTMENT_OTHER): Payer: Self-pay | Admitting: Obstetrics & Gynecology

## 2022-06-02 ENCOUNTER — Other Ambulatory Visit (INDEPENDENT_AMBULATORY_CARE_PROVIDER_SITE_OTHER): Payer: Medicaid Other

## 2022-06-02 ENCOUNTER — Ambulatory Visit (INDEPENDENT_AMBULATORY_CARE_PROVIDER_SITE_OTHER): Payer: Medicaid Other | Admitting: Obstetrics & Gynecology

## 2022-06-02 ENCOUNTER — Other Ambulatory Visit (HOSPITAL_BASED_OUTPATIENT_CLINIC_OR_DEPARTMENT_OTHER): Payer: Self-pay

## 2022-06-02 VITALS — BP 109/73 | HR 73 | Ht 64.0 in | Wt 146.4 lb

## 2022-06-02 DIAGNOSIS — O3680X0 Pregnancy with inconclusive fetal viability, not applicable or unspecified: Secondary | ICD-10-CM

## 2022-06-02 DIAGNOSIS — Z3A00 Weeks of gestation of pregnancy not specified: Secondary | ICD-10-CM | POA: Diagnosis not present

## 2022-06-02 DIAGNOSIS — R110 Nausea: Secondary | ICD-10-CM

## 2022-06-02 DIAGNOSIS — O98219 Gonorrhea complicating pregnancy, unspecified trimester: Secondary | ICD-10-CM | POA: Diagnosis not present

## 2022-06-02 DIAGNOSIS — O219 Vomiting of pregnancy, unspecified: Secondary | ICD-10-CM

## 2022-06-02 DIAGNOSIS — Z716 Tobacco abuse counseling: Secondary | ICD-10-CM | POA: Diagnosis not present

## 2022-06-02 DIAGNOSIS — Z8619 Personal history of other infectious and parasitic diseases: Secondary | ICD-10-CM | POA: Insufficient documentation

## 2022-06-02 MED ORDER — AZITHROMYCIN 500 MG PO TABS
1000.0000 mg | ORAL_TABLET | Freq: Every day | ORAL | 0 refills | Status: DC
  Filled 2022-06-02: qty 2, 1d supply, fill #0

## 2022-06-02 MED ORDER — SCOPOLAMINE 1 MG/3DAYS TD PT72
1.0000 | MEDICATED_PATCH | TRANSDERMAL | 12 refills | Status: DC
  Filled 2022-06-02: qty 10, 30d supply, fill #0

## 2022-06-02 MED ORDER — PRENATAL PLUS VITAMIN/MINERAL 27-1 MG PO TABS
1.0000 | ORAL_TABLET | Freq: Every day | ORAL | 11 refills | Status: DC
  Filled 2022-06-02: qty 30, 30d supply, fill #0

## 2022-06-02 MED ORDER — CEFTRIAXONE SODIUM 500 MG IJ SOLR
500.0000 mg | Freq: Once | INTRAMUSCULAR | Status: AC
  Administered 2022-06-02: 500 mg via INTRAMUSCULAR

## 2022-06-02 MED ORDER — NICOTINE 7 MG/24HR TD PT24
7.0000 mg | MEDICATED_PATCH | Freq: Every day | TRANSDERMAL | 2 refills | Status: DC
  Filled 2022-06-02: qty 28, 28d supply, fill #0

## 2022-06-03 ENCOUNTER — Other Ambulatory Visit (HOSPITAL_BASED_OUTPATIENT_CLINIC_OR_DEPARTMENT_OTHER): Payer: Self-pay

## 2022-06-05 NOTE — Progress Notes (Signed)
GYNECOLOGY  VISIT  CC:   needs STI treatment, follow up to determine pregnancy viability  HPI: 27 y.o. Alexis [name] Single Black or African American female here for repeat ultrasound today to determine viability of pregnancy.  She is having a lot more nausea.  Has used phenergan in the past.  Would really like something that doesn't make her sleepy.  Has not tried scopolamine patches.  Denies vaginal bleeding or pelvic pain.  No fever.  Recently diagnosed with both chlamydia and gonorrhea.  She needs treatment with rocephin and will be given today.  Azithromax was unavailable at her pharmacy.  New rx sent to [facility].  HD has been notified.  Pt is smoking.  Cessation discussed.  She reports she is down to about four cigarettes.  Discussed nicotine patches to see if can help her stop with the pregnancy.  She is open to this and ready to start now.  States partner is really pushing her to stop so this could be all she needs.  Dosing and administration discussed.   Past Medical History:  Diagnosis Date   Beta thalassemia trait    Family history of breast cancer in mother    Gonorrhea 12/2020   treated   Marijuana abuse    Seizures    last seizure in 2021   Shingles outbreak 10/2018   Trichomonas infection 02/2021    MEDS:   Current Outpatient Medications on File Prior to Visit  Medication Sig Dispense Refill   acetaminophen (TYLENOL) 325 MG tablet Take 2 tablets (650 mg total) by mouth every 4 (four) hours as needed (for pain scale < 4). 30 tablet 0   ferrous sulfate 325 (65 FE) MG EC tablet Take 1 tablet (325 mg total) by mouth every other day. (Patient not taking: Reported on 10/26/2021) 30 tablet 2   lidocaine (XYLOCAINE) 2 % solution Swallow 64mL every 6 hours as needed for sore throat 100 mL 0   promethazine (PHENERGAN) 12.5 MG tablet Take 1 tablet (12.5 mg total) by mouth every 6 (six) hours as needed for nausea or vomiting. (Patient not taking: Reported on 06/02/2022) 30 tablet 0   No  current facility-administered medications on file prior to visit.    ALLERGIES: Patient has no known allergies.  SH:  single, smoker  Review of Systems  Constitutional: Negative.   Gastrointestinal:  Positive for nausea.  Genitourinary: Negative.     PHYSICAL EXAMINATION:    BP 109/73   Pulse 73   Ht 5\' 4"  (1.626 m)   Wt 146 lb 6.4 oz (66.4 kg)   LMP 02/25/2022 (Approximate)   Breastfeeding No   BMI 25.13 kg/m     General appearance: alert, cooperative and appears stated age Lymph:  no inguinal LAD noted  Pelvic: External genitalia:  no lesions              Urethra:  normal appearing urethra with no masses, tenderness or lesions              Bartholins and Skenes: normal                 Vagina: normal appearing vagina with normal color and discharge, no lesions              Cervix: no lesions              Bimanual Exam:  Uterus:  normal size, contour, position, consistency, mobility, non-tender  Ultrasound is documented separately .  Chaperone, Raechel Ache, RN, was present for exam.  Assessment/Plan: 1. Encounter to determine fetal viability of pregnancy, single or unspecified fetus - new ob appt will be scheduled for pt - US OB Comp Less 14 Wks; Future - Prenatal Vit-Fe Fumarate-FA (PRENATAL PLUS VITAMIN/MINERAL) 27-1 MG TABS; Take 1 tablet by mouth daily.  Dispense: 30 tablet; Refill: 11  2. Encounter for smoking cessation counseling - nicotine (NICODERM CQ - DOSED IN MG/24 HR) 7 mg/24hr patch; Place 1 patch (7 mg total) onto the skin daily.  Dispense: 28 patch; Refill: 2  3. Nausea - rx for scopolamine to pharmacy  4. History of gonorrhea - cefTRIAXone (ROCEPHIN) injection 500 mg  5. History of chlamydia - pt reports rx for 1 gram powder had to be ordered at her pharmacy so was not taken - azithromycin (ZITHROMAX) 500 MG tablet; Take 2 tablets (1,000 mg total) by mouth daily.  Dispense: 2 tablet; Refill: 0 -   6. Nausea/vomiting in pregnancy -  scopolamine (TRANSDERM-SCOP) 1 MG/3DAYS; Place 1 patch (1.5 mg total) onto the skin every 3 (three) days.  Dispense: 10 patch; Refill: 12

## 2022-06-13 ENCOUNTER — Other Ambulatory Visit (HOSPITAL_BASED_OUTPATIENT_CLINIC_OR_DEPARTMENT_OTHER): Payer: Self-pay

## 2022-06-14 ENCOUNTER — Encounter (HOSPITAL_BASED_OUTPATIENT_CLINIC_OR_DEPARTMENT_OTHER): Payer: Self-pay | Admitting: Obstetrics & Gynecology

## 2022-06-14 ENCOUNTER — Ambulatory Visit (INDEPENDENT_AMBULATORY_CARE_PROVIDER_SITE_OTHER): Payer: Medicaid Other | Admitting: Obstetrics & Gynecology

## 2022-06-14 VITALS — BP 115/76 | HR 93 | Ht 66.0 in | Wt 145.0 lb

## 2022-06-14 DIAGNOSIS — Z3480 Encounter for supervision of other normal pregnancy, unspecified trimester: Secondary | ICD-10-CM

## 2022-06-14 DIAGNOSIS — O09899 Supervision of other high risk pregnancies, unspecified trimester: Secondary | ICD-10-CM | POA: Diagnosis not present

## 2022-06-14 DIAGNOSIS — A549 Gonococcal infection, unspecified: Secondary | ICD-10-CM

## 2022-06-14 DIAGNOSIS — Z8619 Personal history of other infectious and parasitic diseases: Secondary | ICD-10-CM

## 2022-06-14 DIAGNOSIS — A749 Chlamydial infection, unspecified: Secondary | ICD-10-CM

## 2022-06-14 DIAGNOSIS — D563 Thalassemia minor: Secondary | ICD-10-CM

## 2022-06-14 DIAGNOSIS — Z2839 Other underimmunization status: Secondary | ICD-10-CM

## 2022-06-14 DIAGNOSIS — F17200 Nicotine dependence, unspecified, uncomplicated: Secondary | ICD-10-CM

## 2022-06-14 DIAGNOSIS — Z8759 Personal history of other complications of pregnancy, childbirth and the puerperium: Secondary | ICD-10-CM

## 2022-06-14 DIAGNOSIS — Z3481 Encounter for supervision of other normal pregnancy, first trimester: Secondary | ICD-10-CM

## 2022-06-14 DIAGNOSIS — R87612 Low grade squamous intraepithelial lesion on cytologic smear of cervix (LGSIL): Secondary | ICD-10-CM

## 2022-06-14 DIAGNOSIS — G40909 Epilepsy, unspecified, not intractable, without status epilepticus: Secondary | ICD-10-CM

## 2022-06-14 DIAGNOSIS — Z3A10 10 weeks gestation of pregnancy: Secondary | ICD-10-CM

## 2022-06-14 MED ORDER — BLOOD PRESSURE KIT DEVI: 1.0000 | Freq: Once | 0 refills | Status: AC

## 2022-06-15 LAB — HEPATITIS C ANTIBODY: Hep C Virus Ab: NONREACTIVE

## 2022-06-15 LAB — CBC
Hematocrit: 32.7 % — ABNORMAL LOW (ref 34.0–46.6)
Hemoglobin: 10.6 g/dL — ABNORMAL LOW (ref 11.1–15.9)
MCH: 22.8 pg — ABNORMAL LOW (ref 26.6–33.0)
MCHC: 32.4 g/dL (ref 31.5–35.7)
MCV: 71 fL — ABNORMAL LOW (ref 79–97)
Platelets: 358 10*3/uL (ref 150–450)
RBC: 4.64 x10E6/uL (ref 3.77–5.28)
RDW: 15.8 % — ABNORMAL HIGH (ref 11.7–15.4)
WBC: 7.8 10*3/uL (ref 3.4–10.8)

## 2022-06-15 LAB — HIV ANTIBODY (ROUTINE TESTING W REFLEX): HIV Screen 4th Generation wRfx: NONREACTIVE

## 2022-06-15 LAB — RUBELLA SCREEN: Rubella Antibodies, IGG: 1.38 index (ref >0.99)

## 2022-06-15 LAB — RPR: RPR Ser Ql: NONREACTIVE

## 2022-06-15 LAB — ANTIBODY SCREEN: Antibody Screen: NEGATIVE

## 2022-06-15 LAB — HEPATITIS B SURFACE ANTIGEN: Hepatitis B Surface Ag: NEGATIVE

## 2022-06-21 ENCOUNTER — Encounter (HOSPITAL_BASED_OUTPATIENT_CLINIC_OR_DEPARTMENT_OTHER): Payer: Self-pay | Admitting: Obstetrics & Gynecology

## 2022-06-21 DIAGNOSIS — Z8759 Personal history of other complications of pregnancy, childbirth and the puerperium: Secondary | ICD-10-CM | POA: Insufficient documentation
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
## 2022-06-26 ENCOUNTER — Other Ambulatory Visit (HOSPITAL_BASED_OUTPATIENT_CLINIC_OR_DEPARTMENT_OTHER): Payer: Self-pay | Admitting: Obstetrics & Gynecology

## 2022-06-26 ENCOUNTER — Encounter (HOSPITAL_BASED_OUTPATIENT_CLINIC_OR_DEPARTMENT_OTHER): Payer: Self-pay | Admitting: Obstetrics & Gynecology

## 2022-06-26 DIAGNOSIS — G40909 Epilepsy, unspecified, not intractable, without status epilepticus: Secondary | ICD-10-CM

## 2022-06-26 DIAGNOSIS — A549 Gonococcal infection, unspecified: Secondary | ICD-10-CM

## 2022-06-26 DIAGNOSIS — D563 Thalassemia minor: Secondary | ICD-10-CM

## 2022-06-26 DIAGNOSIS — R87612 Low grade squamous intraepithelial lesion on cytologic smear of cervix (LGSIL): Secondary | ICD-10-CM

## 2022-06-26 DIAGNOSIS — A749 Chlamydial infection, unspecified: Secondary | ICD-10-CM

## 2022-06-26 DIAGNOSIS — Z3480 Encounter for supervision of other normal pregnancy, unspecified trimester: Secondary | ICD-10-CM

## 2022-06-28 ENCOUNTER — Telehealth (HOSPITAL_BASED_OUTPATIENT_CLINIC_OR_DEPARTMENT_OTHER): Payer: Self-pay | Admitting: Obstetrics & Gynecology

## 2022-06-28 NOTE — Telephone Encounter (Signed)
Patient called and would like for someone to please call her about her results.

## 2022-06-29 LAB — PANORAMA PRENATAL TEST FULL PANEL:PANORAMA TEST PLUS 5 ADDITIONAL MICRODELETIONS

## 2022-06-29 NOTE — Telephone Encounter (Signed)
Called pt to let her know that her panorama test requires redraw. Advised that [facility] could come to her home and draw the blood work. Pt would like to proceed. Advised that [facility] would reach out to her for scheduling.

## 2022-07-05 ENCOUNTER — Other Ambulatory Visit (HOSPITAL_BASED_OUTPATIENT_CLINIC_OR_DEPARTMENT_OTHER): Payer: Self-pay | Admitting: *Deleted

## 2022-07-05 DIAGNOSIS — Z3481 Encounter for supervision of other normal pregnancy, first trimester: Secondary | ICD-10-CM

## 2022-07-11 ENCOUNTER — Encounter (HOSPITAL_BASED_OUTPATIENT_CLINIC_OR_DEPARTMENT_OTHER): Payer: Medicaid Other | Admitting: Obstetrics & Gynecology

## 2022-07-19 ENCOUNTER — Other Ambulatory Visit (HOSPITAL_COMMUNITY)
Admission: RE | Admit: 2022-07-19 | Discharge: 2022-07-19 | Disposition: A | Discharge status: Home / Self Care | Payer: Medicaid Other | Source: Ambulatory Visit | Attending: Obstetrics & Gynecology | Admitting: Obstetrics & Gynecology

## 2022-07-19 ENCOUNTER — Ambulatory Visit (INDEPENDENT_AMBULATORY_CARE_PROVIDER_SITE_OTHER): Payer: Medicaid Other | Admitting: Obstetrics & Gynecology

## 2022-07-19 VITALS — BP 120/78 | HR 100 | Wt 149.4 lb

## 2022-07-19 DIAGNOSIS — Z8619 Personal history of other infectious and parasitic diseases: Secondary | ICD-10-CM | POA: Insufficient documentation

## 2022-07-19 DIAGNOSIS — D563 Thalassemia minor: Secondary | ICD-10-CM

## 2022-07-19 DIAGNOSIS — Z3482 Encounter for supervision of other normal pregnancy, second trimester: Secondary | ICD-10-CM

## 2022-07-19 DIAGNOSIS — Z3A15 15 weeks gestation of pregnancy: Secondary | ICD-10-CM

## 2022-07-19 DIAGNOSIS — O219 Vomiting of pregnancy, unspecified: Secondary | ICD-10-CM

## 2022-07-19 DIAGNOSIS — Z8759 Personal history of other complications of pregnancy, childbirth and the puerperium: Secondary | ICD-10-CM

## 2022-07-19 DIAGNOSIS — O99011 Anemia complicating pregnancy, first trimester: Secondary | ICD-10-CM

## 2022-07-19 DIAGNOSIS — F17200 Nicotine dependence, unspecified, uncomplicated: Secondary | ICD-10-CM

## 2022-07-19 MED ORDER — ONDANSETRON 4 MG PO TBDP
4.0000 mg | ORAL_TABLET | Freq: Three times a day (TID) | ORAL | 1 refills | Status: DC | PRN
Start: 2022-07-19 — End: 2022-09-16
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
## 2022-07-20 LAB — CERVICOVAGINAL ANCILLARY ONLY
Chlamydia: POSITIVE — AB
Comment: NEGATIVE
Comment: NEGATIVE
Comment: NORMAL
Neisseria Gonorrhea: POSITIVE — AB
Trichomonas: NEGATIVE

## 2022-07-21 LAB — AFP, SERUM, OPEN SPINA BIFIDA
AFP MoM: 1
AFP Value: 33.1 ng/mL
Gest. Age on Collection Date: 15 weeks
Maternal Age At EDD: 29 yr
OSBR Risk 1 IN: 10000
Test Results:: NEGATIVE
Weight: 145 [lb_av]

## 2022-07-25 ENCOUNTER — Encounter (HOSPITAL_BASED_OUTPATIENT_CLINIC_OR_DEPARTMENT_OTHER): Payer: Self-pay | Admitting: *Deleted

## 2022-07-26 ENCOUNTER — Other Ambulatory Visit (HOSPITAL_BASED_OUTPATIENT_CLINIC_OR_DEPARTMENT_OTHER): Payer: Self-pay | Admitting: *Deleted

## 2022-07-26 MED ORDER — AZITHROMYCIN 250 MG PO TABS: 1000.0000 mg | ORAL_TABLET | Freq: Once | ORAL | 0 refills | Status: AC

## 2022-08-02 ENCOUNTER — Ambulatory Visit (HOSPITAL_BASED_OUTPATIENT_CLINIC_OR_DEPARTMENT_OTHER): Payer: Medicaid Other

## 2022-08-02 ENCOUNTER — Telehealth (HOSPITAL_BASED_OUTPATIENT_CLINIC_OR_DEPARTMENT_OTHER): Payer: Self-pay | Admitting: Obstetrics & Gynecology

## 2022-08-02 NOTE — Telephone Encounter (Signed)
Called patient and left a message to please call the office to reschedule her missed appointment today.

## 2022-08-15 ENCOUNTER — Ambulatory Visit: Payer: Medicaid Other | Admitting: *Deleted

## 2022-08-15 ENCOUNTER — Encounter: Payer: Self-pay | Admitting: *Deleted

## 2022-08-15 ENCOUNTER — Other Ambulatory Visit: Payer: Self-pay | Admitting: *Deleted

## 2022-08-15 ENCOUNTER — Ambulatory Visit: Payer: Medicaid Other | Attending: Obstetrics & Gynecology

## 2022-08-15 VITALS — BP 104/63 | HR 100

## 2022-08-15 DIAGNOSIS — Z3689 Encounter for other specified antenatal screening: Secondary | ICD-10-CM

## 2022-08-15 DIAGNOSIS — Z3481 Encounter for supervision of other normal pregnancy, first trimester: Secondary | ICD-10-CM | POA: Diagnosis present

## 2022-08-15 DIAGNOSIS — Z8759 Personal history of other complications of pregnancy, childbirth and the puerperium: Secondary | ICD-10-CM

## 2022-08-15 DIAGNOSIS — O99332 Smoking (tobacco) complicating pregnancy, second trimester: Secondary | ICD-10-CM

## 2022-08-15 DIAGNOSIS — D563 Thalassemia minor: Secondary | ICD-10-CM

## 2022-08-16 ENCOUNTER — Ambulatory Visit (INDEPENDENT_AMBULATORY_CARE_PROVIDER_SITE_OTHER): Payer: Medicaid Other | Admitting: Obstetrics & Gynecology

## 2022-08-16 ENCOUNTER — Other Ambulatory Visit (HOSPITAL_BASED_OUTPATIENT_CLINIC_OR_DEPARTMENT_OTHER): Payer: Self-pay

## 2022-08-16 VITALS — BP 111/65 | HR 84 | Wt 155.0 lb

## 2022-08-16 DIAGNOSIS — O99011 Anemia complicating pregnancy, first trimester: Secondary | ICD-10-CM

## 2022-08-16 DIAGNOSIS — O09899 Supervision of other high risk pregnancies, unspecified trimester: Secondary | ICD-10-CM

## 2022-08-16 DIAGNOSIS — A749 Chlamydial infection, unspecified: Secondary | ICD-10-CM

## 2022-08-16 DIAGNOSIS — A549 Gonococcal infection, unspecified: Secondary | ICD-10-CM | POA: Diagnosis not present

## 2022-08-16 DIAGNOSIS — D563 Thalassemia minor: Secondary | ICD-10-CM

## 2022-08-16 DIAGNOSIS — R87612 Low grade squamous intraepithelial lesion on cytologic smear of cervix (LGSIL): Secondary | ICD-10-CM

## 2022-08-16 DIAGNOSIS — Z3A19 19 weeks gestation of pregnancy: Secondary | ICD-10-CM

## 2022-08-16 DIAGNOSIS — Z8619 Personal history of other infectious and parasitic diseases: Secondary | ICD-10-CM

## 2022-08-16 DIAGNOSIS — G40909 Epilepsy, unspecified, not intractable, without status epilepticus: Secondary | ICD-10-CM

## 2022-08-16 DIAGNOSIS — Z3480 Encounter for supervision of other normal pregnancy, unspecified trimester: Secondary | ICD-10-CM

## 2022-08-16 DIAGNOSIS — F17200 Nicotine dependence, unspecified, uncomplicated: Secondary | ICD-10-CM

## 2022-08-16 MED ORDER — CEFTRIAXONE SODIUM 500 MG IJ SOLR
500.0000 mg | Freq: Once | INTRAMUSCULAR | Status: AC
  Administered 2022-08-16: 500 mg via INTRAMUSCULAR

## 2022-08-16 MED ORDER — AZITHROMYCIN 250 MG PO TABS
1000.0000 mg | ORAL_TABLET | Freq: Once | ORAL | 0 refills | Status: AC
  Filled 2022-08-16: qty 4, 1d supply, fill #0
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
## 2022-08-26 ENCOUNTER — Encounter (HOSPITAL_BASED_OUTPATIENT_CLINIC_OR_DEPARTMENT_OTHER): Payer: Self-pay | Admitting: *Deleted

## 2022-08-27 ENCOUNTER — Other Ambulatory Visit (HOSPITAL_BASED_OUTPATIENT_CLINIC_OR_DEPARTMENT_OTHER): Payer: Self-pay

## 2022-09-13 ENCOUNTER — Encounter (HOSPITAL_BASED_OUTPATIENT_CLINIC_OR_DEPARTMENT_OTHER): Payer: Medicaid Other | Admitting: Advanced Practice Midwife

## 2022-09-13 ENCOUNTER — Telehealth (HOSPITAL_BASED_OUTPATIENT_CLINIC_OR_DEPARTMENT_OTHER): Payer: Self-pay | Admitting: Advanced Practice Midwife

## 2022-09-16 ENCOUNTER — Other Ambulatory Visit (HOSPITAL_COMMUNITY)
Admission: RE | Admit: 2022-09-16 | Discharge: 2022-09-16 | Disposition: A | Discharge status: Home / Self Care | Payer: Medicaid Other | Source: Ambulatory Visit | Attending: Obstetrics & Gynecology | Admitting: Obstetrics & Gynecology

## 2022-09-16 ENCOUNTER — Ambulatory Visit (INDEPENDENT_AMBULATORY_CARE_PROVIDER_SITE_OTHER): Payer: Medicaid Other | Admitting: Obstetrics & Gynecology

## 2022-09-16 VITALS — BP 103/75 | HR 74 | Wt 163.6 lb

## 2022-09-16 DIAGNOSIS — O99011 Anemia complicating pregnancy, first trimester: Secondary | ICD-10-CM

## 2022-09-16 DIAGNOSIS — D563 Thalassemia minor: Secondary | ICD-10-CM

## 2022-09-16 DIAGNOSIS — Z3A23 23 weeks gestation of pregnancy: Secondary | ICD-10-CM

## 2022-09-16 DIAGNOSIS — Z8619 Personal history of other infectious and parasitic diseases: Secondary | ICD-10-CM

## 2022-09-16 DIAGNOSIS — Z3480 Encounter for supervision of other normal pregnancy, unspecified trimester: Secondary | ICD-10-CM

## 2022-09-16 DIAGNOSIS — Z8759 Personal history of other complications of pregnancy, childbirth and the puerperium: Secondary | ICD-10-CM

## 2022-09-16 DIAGNOSIS — F17200 Nicotine dependence, unspecified, uncomplicated: Secondary | ICD-10-CM
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
## 2022-09-18 ENCOUNTER — Encounter (HOSPITAL_BASED_OUTPATIENT_CLINIC_OR_DEPARTMENT_OTHER): Payer: Self-pay | Admitting: Obstetrics & Gynecology

## 2022-09-20 ENCOUNTER — Other Ambulatory Visit: Payer: Self-pay | Admitting: Obstetrics and Gynecology

## 2022-09-20 DIAGNOSIS — A749 Chlamydial infection, unspecified: Secondary | ICD-10-CM

## 2022-09-20 MED ORDER — AZITHROMYCIN 500 MG PO TABS
1000.0000 mg | ORAL_TABLET | Freq: Once | ORAL | 1 refills | Status: AC
Start: 2022-09-20 — End: 2022-09-20

## 2022-10-03 ENCOUNTER — Inpatient Hospital Stay (HOSPITAL_COMMUNITY)
Admission: AD | Admit: 2022-10-03 | Discharge: 2022-10-03 | Disposition: A | Discharge status: Home / Self Care | Payer: Medicaid Other | Attending: Obstetrics & Gynecology | Admitting: Obstetrics & Gynecology

## 2022-10-03 ENCOUNTER — Encounter (HOSPITAL_COMMUNITY): Payer: Self-pay | Admitting: Obstetrics & Gynecology

## 2022-10-03 ENCOUNTER — Inpatient Hospital Stay (HOSPITAL_COMMUNITY): Payer: Medicaid Other

## 2022-10-03 DIAGNOSIS — G8929 Other chronic pain: Secondary | ICD-10-CM | POA: Diagnosis not present

## 2022-10-03 DIAGNOSIS — R1031 Right lower quadrant pain: Secondary | ICD-10-CM | POA: Diagnosis present

## 2022-10-03 DIAGNOSIS — Z3A26 26 weeks gestation of pregnancy: Secondary | ICD-10-CM | POA: Insufficient documentation

## 2022-10-03 DIAGNOSIS — O99332 Smoking (tobacco) complicating pregnancy, second trimester: Secondary | ICD-10-CM | POA: Insufficient documentation

## 2022-10-03 DIAGNOSIS — A5611 Chlamydial female pelvic inflammatory disease: Secondary | ICD-10-CM | POA: Insufficient documentation

## 2022-10-03 DIAGNOSIS — F17210 Nicotine dependence, cigarettes, uncomplicated: Secondary | ICD-10-CM | POA: Insufficient documentation

## 2022-10-03 DIAGNOSIS — R109 Unspecified abdominal pain: Secondary | ICD-10-CM

## 2022-10-03 DIAGNOSIS — M5459 Other low back pain: Secondary | ICD-10-CM | POA: Diagnosis present

## 2022-10-03 DIAGNOSIS — O98312 Other infections with a predominantly sexual mode of transmission complicating pregnancy, second trimester: Secondary | ICD-10-CM | POA: Diagnosis not present

## 2022-10-03 DIAGNOSIS — O4702 False labor before 37 completed weeks of gestation, second trimester: Secondary | ICD-10-CM | POA: Diagnosis present

## 2022-10-03 DIAGNOSIS — O26892 Other specified pregnancy related conditions, second trimester: Secondary | ICD-10-CM | POA: Insufficient documentation

## 2022-10-03 DIAGNOSIS — R1011 Right upper quadrant pain: Secondary | ICD-10-CM | POA: Insufficient documentation

## 2022-10-03 DIAGNOSIS — A749 Chlamydial infection, unspecified: Secondary | ICD-10-CM

## 2022-10-03 DIAGNOSIS — M549 Dorsalgia, unspecified: Secondary | ICD-10-CM | POA: Insufficient documentation

## 2022-10-03 LAB — COMPREHENSIVE METABOLIC PANEL
ALT: 8 U/L (ref 0–44)
AST: 14 U/L — ABNORMAL LOW (ref 15–41)
Albumin: 2.9 g/dL — ABNORMAL LOW (ref 3.5–5.0)
Alkaline Phosphatase: 44 U/L (ref 38–126)
Anion gap: 10 (ref 5–15)
BUN: <5 mg/dL — ABNORMAL LOW (ref 6–20)
CO2: 22 mmol/L (ref 22–32)
Calcium: 8.8 mg/dL — ABNORMAL LOW (ref 8.9–10.3)
Chloride: 105 mmol/L (ref 98–111)
Creatinine, Ser: 0.46 mg/dL (ref 0.44–1.00)
GFR, Estimated: >60 mL/min (ref >60)
Glucose, Bld: 82 mg/dL (ref 70–99)
Potassium: 4.3 mmol/L (ref 3.5–5.1)
Sodium: 137 mmol/L (ref 135–145)
Total Bilirubin: 0.3 mg/dL (ref 0.3–1.2)
Total Protein: 6 g/dL — ABNORMAL LOW (ref 6.5–8.1)

## 2022-10-03 LAB — URINALYSIS, ROUTINE W REFLEX MICROSCOPIC
Bilirubin Urine: NEGATIVE
Glucose, UA: NEGATIVE mg/dL
Hgb urine dipstick: NEGATIVE
Ketones, ur: NEGATIVE mg/dL
Leukocytes,Ua: NEGATIVE
Nitrite: NEGATIVE
Protein, ur: NEGATIVE mg/dL
Specific Gravity, Urine: 1.023 (ref 1.005–1.030)
pH: 7 (ref 5.0–8.0)

## 2022-10-03 LAB — CBC WITH DIFFERENTIAL/PLATELET
Abs Immature Granulocytes: 0.12 10*3/uL — ABNORMAL HIGH (ref 0.00–0.07)
Basophils Absolute: 0 10*3/uL (ref 0.0–0.1)
Basophils Relative: 0 %
Eosinophils Absolute: 0.1 10*3/uL (ref 0.0–0.5)
Eosinophils Relative: 1 %
HCT: 28.2 % — ABNORMAL LOW (ref 36.0–46.0)
Hemoglobin: 9.4 g/dL — ABNORMAL LOW (ref 12.0–15.0)
Immature Granulocytes: 1 %
Lymphocytes Relative: 11 %
Lymphs Abs: 1.3 10*3/uL (ref 0.7–4.0)
MCH: 24.4 pg — ABNORMAL LOW (ref 26.0–34.0)
MCHC: 33.3 g/dL (ref 30.0–36.0)
MCV: 73.2 fL — ABNORMAL LOW (ref 80.0–100.0)
Monocytes Absolute: 0.5 10*3/uL (ref 0.1–1.0)
Monocytes Relative: 5 %
Neutro Abs: 9.4 10*3/uL — ABNORMAL HIGH (ref 1.7–7.7)
Neutrophils Relative %: 82 %
Platelets: 260 10*3/uL (ref 150–400)
RBC: 3.85 MIL/uL — ABNORMAL LOW (ref 3.87–5.11)
RDW: 15.2 % (ref 11.5–15.5)
WBC: 11.5 10*3/uL — ABNORMAL HIGH (ref 4.0–10.5)
nRBC: 0 % (ref 0.0–0.2)

## 2022-10-03 LAB — AMYLASE: Amylase: 148 U/L — ABNORMAL HIGH (ref 28–100)

## 2022-10-03 LAB — LIPASE, BLOOD: Lipase: 35 U/L (ref 11–51)

## 2022-10-03 MED ORDER — AZITHROMYCIN 250 MG PO TABS
1000.0000 mg | ORAL_TABLET | Freq: Once | ORAL | Status: AC
  Administered 2022-10-03: 1000 mg via ORAL
  Filled 2022-10-03: qty 4

## 2022-10-03 MED ORDER — HYDROMORPHONE HCL 1 MG/ML IJ SOLN
1.0000 mg | Freq: Once | INTRAMUSCULAR | Status: AC
  Administered 2022-10-03: 1 mg via INTRAVENOUS
  Filled 2022-10-03: qty 1

## 2022-10-03 MED ORDER — CYCLOBENZAPRINE HCL 5 MG PO TABS
10.0000 mg | ORAL_TABLET | Freq: Once | ORAL | Status: AC
  Administered 2022-10-03: 10 mg via ORAL
  Filled 2022-10-03: qty 2

## 2022-10-03 MED ORDER — ACETAMINOPHEN 500 MG PO TABS
1000.0000 mg | ORAL_TABLET | Freq: Once | ORAL | Status: AC
  Administered 2022-10-03: 1000 mg via ORAL
  Filled 2022-10-03: qty 2

## 2022-10-03 NOTE — MAU Provider Note (Signed)
History     CSN: [id number]  Arrival date and time: 10/03/22 1012   Event Date/Time   First Provider Initiated Contact with Patient 10/03/22 1038      Chief Complaint  Patient presents with   Abdominal Pain   Back Pain   Alexis [name] , a  27 y.o. [id number] at [redacted]w[redacted]d presents to MAU via EMS with complaints of on-going back pain and abdominal pain. Patient reports lower back cramping, that has been on-going for weeks. She states it feels more like braxton hick and has had this pain on other pregnancies. She states that she has tried heat, ice and Tylenol last dose at 3am and pain unrelieved. 10/10 currently.   She also reports right sided abdominal pain that radiates upwards. She states that this has been on-going for the past few days. She states that this pain is a constant ache and is worsened by laying down. She denies nausea and vomiting or problems with constipation. Last BM was ~1 days ago. She states that her pain was unrelieved with the above methods and also worsens with lying down. She denies vaginal bleeding, leaking of fluid or abnormal vaginal discharge. She states that she was recently treated for Gonorrhea but states that she was unable to pick up her antibiotic for the chlamydia.         OB History     Gravida  4   Para  3   Term  3   Preterm      AB      Living  3      SAB      IAB      Ectopic      Multiple  0   Live Births  3           Past Medical History:  Diagnosis Date   Beta thalassemia trait    Family history of breast cancer in mother    Gonorrhea 12/2020   treated   Marijuana abuse    Seizures (HCC)    last seizure in 2021   Shingles outbreak 10/2018   Trichomonas infection 02/2021    Past Surgical History:  Procedure Laterality Date   NO PAST SURGERIES      Family History  Problem Relation Age of Onset   Cancer Mother 38       breast   Heart disease Father    Heart disease Paternal Grandmother    Heart  disease Paternal Grandfather     Social History   Tobacco Use   Smoking status: Every Day    Current packs/day: 0.50    Types: Cigarettes    Passive exposure: Never   Smokeless tobacco: Never   Tobacco comments:    5=6 cigs a day  Vaping Use   Vaping status: Never Used  Substance Use Topics   Alcohol use: Not Currently   Drug use: Not Currently    Types: Marijuana    Comment: not since +UPT    Allergies: No Known Allergies  Medications Prior to Admission  Medication Sig Dispense Refill Last Dose   acetaminophen (TYLENOL) 500 MG tablet Take 500 mg by mouth every 6 (six) hours as needed.   10/03/2022 at 0300   Prenatal Vit-Fe Fumarate-FA (PRENATAL PLUS VITAMIN/MINERAL) 27-1 MG TABS Take 1 tablet by mouth daily. 30 tablet 11 10/03/2022   acetaminophen (TYLENOL) 325 MG tablet Take 2 tablets (650 mg total) by mouth every 4 (four) hours as needed (for pain scale <  4). (Patient not taking: Reported on 09/16/2022) 30 tablet 0    ferrous sulfate 325 (65 FE) MG EC tablet Take 1 tablet (325 mg total) by mouth every other day. (Patient not taking: Reported on 10/26/2021) 30 tablet 2    lidocaine (XYLOCAINE) 2 % solution Swallow 5mL every 6 hours as needed for sore throat (Patient not taking: Reported on 08/15/2022) 100 mL 0    nicotine (NICODERM CQ - DOSED IN MG/24 HR) 7 mg/24hr patch Place 1 patch (7 mg total) onto the skin daily. (Patient not taking: Reported on 08/15/2022) 28 patch 2     Review of Systems  Constitutional:  Negative for chills, fatigue and fever.  Eyes:  Negative for pain and visual disturbance.  Respiratory:  Negative for apnea, shortness of breath and wheezing.   Cardiovascular:  Negative for chest pain and palpitations.  Gastrointestinal:  Positive for abdominal pain. Negative for constipation, diarrhea, nausea and vomiting.  Genitourinary:  Positive for pelvic pain. Negative for difficulty urinating, dysuria, vaginal bleeding, vaginal discharge and vaginal pain.   Musculoskeletal:  Positive for back pain.  Neurological:  Negative for seizures, weakness and headaches.  Psychiatric/Behavioral:  Negative for suicidal ideas.    Physical Exam   Blood pressure 109/63, pulse 81, temperature 97.7 F (36.5 C), temperature source Oral, resp. rate 16, height 5\' 5"  (1.651 m), weight 72.6 kg, last menstrual period 02/25/2022, SpO2 100%, not currently breastfeeding.  Physical Exam Vitals and nursing note reviewed.  Constitutional:      General: She is not in acute distress.    Appearance: Normal appearance.  HENT:     Head: Normocephalic.  Pulmonary:     Effort: Pulmonary effort is normal.  Abdominal:     Palpations: Abdomen is soft.     Tenderness: There is abdominal tenderness in the right upper quadrant. There is no right CVA tenderness, left CVA tenderness, guarding or rebound.     Comments: Gravid uterus   Musculoskeletal:     Cervical back: Normal range of motion.  Skin:    General: Skin is warm and dry.  Neurological:     Mental Status: She is alert and oriented to person, place, and time.  Psychiatric:        Mood and Affect: Mood normal.    FHT: 135 bpm with moderate variability. Accels present no decels (appropriate for gestational age)  Toco: quiet   MAU Course  Procedures Orders Placed This Encounter  Procedures   US Abdomen Limited RUQ (LIVER/GB)   Urinalysis, Routine w reflex microscopic -Urine, Clean Catch   CBC with Differential/Platelet   Comprehensive metabolic panel   Amylase   Lipase, blood   Discharge patient   Meds ordered this encounter  Medications   acetaminophen (TYLENOL) tablet 1,000 mg   cyclobenzaprine (FLEXERIL) tablet 10 mg   azithromycin (ZITHROMAX) tablet 1,000 mg   HYDROmorphone (DILAUDID) injection 1 mg   Results for orders placed or performed during the hospital encounter of 10/03/22 (from the past 24 hour(s))  Urinalysis, Routine w reflex microscopic -Urine, Clean Catch     Status: Abnormal    Collection Time: 10/03/22 10:32 AM  Result Value Ref Range   Color, Urine YELLOW YELLOW   APPearance HAZY (A) CLEAR   Specific Gravity, Urine 1.023 1.005 - 1.030   pH 7.0 5.0 - 8.0   Glucose, UA NEGATIVE NEGATIVE mg/dL   Hgb urine dipstick NEGATIVE NEGATIVE   Bilirubin Urine NEGATIVE NEGATIVE   Ketones, ur NEGATIVE NEGATIVE mg/dL  Protein, ur NEGATIVE NEGATIVE mg/dL   Nitrite NEGATIVE NEGATIVE   Leukocytes,Ua NEGATIVE NEGATIVE  CBC with Differential/Platelet     Status: Abnormal   Collection Time: 10/03/22 12:08 PM  Result Value Ref Range   WBC 11.5 (H) 4.0 - 10.5 K/uL   RBC 3.85 (L) 3.87 - 5.11 MIL/uL   Hemoglobin 9.4 (L) 12.0 - 15.0 g/dL   HCT 16.1 (L) 09.6 - 04.5 %   MCV 73.2 (L) 80.0 - 100.0 fL   MCH 24.4 (L) 26.0 - 34.0 pg   MCHC 33.3 30.0 - 36.0 g/dL   RDW 40.9 81.1 - 91.4 %   Platelets 260 150 - 400 K/uL   nRBC 0.0 0.0 - 0.2 %   Neutrophils Relative % 82 %   Neutro Abs 9.4 (H) 1.7 - 7.7 K/uL   Lymphocytes Relative 11 %   Lymphs Abs 1.3 0.7 - 4.0 K/uL   Monocytes Relative 5 %   Monocytes Absolute 0.5 0.1 - 1.0 K/uL   Eosinophils Relative 1 %   Eosinophils Absolute 0.1 0.0 - 0.5 K/uL   Basophils Relative 0 %   Basophils Absolute 0.0 0.0 - 0.1 K/uL   Immature Granulocytes 1 %   Abs Immature Granulocytes 0.12 (H) 0.00 - 0.07 K/uL  Comprehensive metabolic panel     Status: Abnormal   Collection Time: 10/03/22 12:08 PM  Result Value Ref Range   Sodium 137 135 - 145 mmol/L   Potassium 4.3 3.5 - 5.1 mmol/L   Chloride 105 98 - 111 mmol/L   CO2 22 22 - 32 mmol/L   Glucose, Bld 82 70 - 99 mg/dL   BUN <5 (L) 6 - 20 mg/dL   Creatinine, Ser 7.82 0.44 - 1.00 mg/dL   Calcium 8.8 (L) 8.9 - 10.3 mg/dL   Total Protein 6.0 (L) 6.5 - 8.1 g/dL   Albumin 2.9 (L) 3.5 - 5.0 g/dL   AST 14 (L) 15 - 41 U/L   ALT 8 0 - 44 U/L   Alkaline Phosphatase 44 38 - 126 U/L   Total Bilirubin 0.3 0.3 - 1.2 mg/dL   GFR, Estimated >95 >62 mL/min   Anion gap 10 5 - 15  Amylase     Status:  Abnormal   Collection Time: 10/03/22 12:08 PM  Result Value Ref Range   Amylase 148 (H) 28 - 100 U/L  Lipase, blood     Status: None   Collection Time: 10/03/22 12:08 PM  Result Value Ref Range   Lipase 35 11 - 51 U/L   US Abdomen Limited RUQ (LIVER/GB)  Result Date: 10/03/2022 CLINICAL DATA:  151471 RUQ pain 151471 EXAM: ULTRASOUND ABDOMEN LIMITED RIGHT UPPER QUADRANT COMPARISON:  None Available. FINDINGS: Gallbladder: No gallstones or wall thickening visualized. No sonographic Murphy sign noted by sonographer. Common bile duct: Diameter: 2 mm. Liver: No focal lesion identified. Within normal limits in parenchymal echogenicity. Portal vein is patent on color Doppler imaging with normal direction of blood flow towards the liver. Other: None. IMPRESSION: Unremarkable right upper quadrant ultrasound. Electronically Signed   By: Tish Frederickson M.D.   On: 10/03/2022 14:01    MDM - 1 time dose of Azithro given in MAU for chlamydia treatment.  - Pain unrelieved with PO Tylenol and Flexeril.  - IV dilaudid ordered.  - RUQ [place] ordered  - Lipase normal - Amylase mildly elevated, expected finding in pregnancy  - RUQ Normal, Low suspicion for complications with gallbladder or liver.  - UA hazy otherwise normal  -  Abdominal pain now 0/10.  - On going back pain remains. Patient reports that this is a on-going problem from previous pregnancies. Reviewed the use of comfort measures and a maternity support belt. Patient verbalized understanding. - plan for discharge.    Assessment and Plan   1. Abdominal pain affecting pregnancy   2. Chronic midline back pain, unspecified back location   3. [redacted] weeks gestation of pregnancy   4. Chlamydia    - Reviewed that chlamydia can cause cramping as well. Rx for Chlamydia given today in MAU.  - Reviewed comfort measures for back pain and discomfort  - Recommended the use of a maternity support belt.  - Worsening signs and return precautions reviewed  - FHT  appropriate for gestational age at time of discharge.  - Patient discharged home in stable condition and may return to MAU as needed.   Claudette Head, MSN CNM  10/03/2022, 2:59 PM

## 2022-10-03 NOTE — MAU Note (Signed)
.  Alexis [name] is a 27 y.o. at [redacted]w[redacted]d here in MAU reporting: arrived via EMS with complaints of constant lower back pain that feels like braxton hicks, but is constant and worse when she stays in one position for too long. She is also having right abdominal pain that that is from her right lower abdomen up laterally to her side. She reports that the pain started a few days ago. Denies VB or abnormal discharge. Does report DFM since yesterday, but says that she is feeling movement now.  Last took tylenol 1000 mg around 0300 this AM with no relief.   Pt received 1000cc bolus of NS with EMS.  LMP: N/A Onset of complaint: On-going Pain score: 10/10 Vitals:   10/03/22 1024 10/03/22 1030  BP: 108/68 109/63  Pulse: 80 81  Resp: 16   Temp: 97.7 F (36.5 C)   SpO2: 100% 100%     FHT:135 bpm Lab orders placed from triage:  UA

## 2022-10-05 NOTE — Telephone Encounter (Signed)
No encounter needed

## 2022-10-08 ENCOUNTER — Telehealth: Payer: Medicaid Other | Admitting: Nurse Practitioner

## 2022-10-08 DIAGNOSIS — B3731 Acute candidiasis of vulva and vagina: Secondary | ICD-10-CM | POA: Diagnosis not present

## 2022-10-08 MED ORDER — FLUCONAZOLE 150 MG PO TABS: 150.0000 mg | ORAL_TABLET | Freq: Once | ORAL | 0 refills | Status: DC

## 2022-10-08 MED ORDER — FLUCONAZOLE 150 MG PO TABS: 150.0000 mg | ORAL_TABLET | Freq: Once | ORAL | 0 refills | Status: AC

## 2022-10-08 MED ORDER — MICONAZOLE NITRATE 100 MG VA SUPP: 100.0000 mg | Freq: Every day | VAGINAL | 0 refills | Status: DC

## 2022-10-08 MED ORDER — MONISTAT 1 DAY OR NIGHT 1200 & 2 MG & % VA KIT: 1.0000 | PACK | Freq: Once | VAGINAL | 0 refills | Status: DC

## 2022-10-08 NOTE — Addendum Note (Signed)
Addended by: Bennie Pierini on: 10/08/2022 03:27 PM   Modules accepted: Orders

## 2022-10-08 NOTE — Addendum Note (Signed)
Addended by: Bennie Pierini on: 10/08/2022 04:37 PM   Modules accepted: Orders

## 2022-10-08 NOTE — Progress Notes (Signed)

## 2022-10-11 ENCOUNTER — Encounter (HOSPITAL_BASED_OUTPATIENT_CLINIC_OR_DEPARTMENT_OTHER): Payer: Self-pay | Admitting: Obstetrics & Gynecology

## 2022-10-11 ENCOUNTER — Encounter (HOSPITAL_BASED_OUTPATIENT_CLINIC_OR_DEPARTMENT_OTHER): Payer: Medicaid Other | Admitting: Advanced Practice Midwife

## 2022-10-11 ENCOUNTER — Encounter (HOSPITAL_BASED_OUTPATIENT_CLINIC_OR_DEPARTMENT_OTHER): Payer: Self-pay

## 2022-10-11 DIAGNOSIS — Z3482 Encounter for supervision of other normal pregnancy, second trimester: Secondary | ICD-10-CM

## 2022-10-12 ENCOUNTER — Telehealth (HOSPITAL_BASED_OUTPATIENT_CLINIC_OR_DEPARTMENT_OTHER): Payer: Self-pay | Admitting: Obstetrics & Gynecology

## 2022-10-12 ENCOUNTER — Ambulatory Visit (INDEPENDENT_AMBULATORY_CARE_PROVIDER_SITE_OTHER): Payer: Medicaid Other | Admitting: Obstetrics & Gynecology

## 2022-10-12 VITALS — BP 110/70 | HR 74 | Wt 168.2 lb

## 2022-10-12 DIAGNOSIS — O09893 Supervision of other high risk pregnancies, third trimester: Secondary | ICD-10-CM

## 2022-10-12 DIAGNOSIS — A549 Gonococcal infection, unspecified: Secondary | ICD-10-CM

## 2022-10-12 DIAGNOSIS — O09899 Supervision of other high risk pregnancies, unspecified trimester: Secondary | ICD-10-CM

## 2022-10-12 DIAGNOSIS — A749 Chlamydial infection, unspecified: Secondary | ICD-10-CM

## 2022-10-12 DIAGNOSIS — F17200 Nicotine dependence, unspecified, uncomplicated: Secondary | ICD-10-CM

## 2022-10-12 DIAGNOSIS — O99011 Anemia complicating pregnancy, first trimester: Secondary | ICD-10-CM

## 2022-10-12 DIAGNOSIS — Z3480 Encounter for supervision of other normal pregnancy, unspecified trimester: Secondary | ICD-10-CM

## 2022-10-12 DIAGNOSIS — Z23 Encounter for immunization: Secondary | ICD-10-CM

## 2022-10-12 DIAGNOSIS — D563 Thalassemia minor: Secondary | ICD-10-CM

## 2022-10-12 DIAGNOSIS — Z3A27 27 weeks gestation of pregnancy: Secondary | ICD-10-CM | POA: Diagnosis not present

## 2022-10-12 DIAGNOSIS — Z3403 Encounter for supervision of normal first pregnancy, third trimester: Secondary | ICD-10-CM
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
## 2022-10-12 NOTE — Telephone Encounter (Signed)
Called patient and left a message to please call the office to let us know if she is on her way to her appointment .

## 2022-10-13 ENCOUNTER — Encounter (HOSPITAL_BASED_OUTPATIENT_CLINIC_OR_DEPARTMENT_OTHER): Payer: Self-pay | Admitting: Obstetrics & Gynecology

## 2022-10-13 LAB — CBC
Hematocrit: 28.9 % — ABNORMAL LOW (ref 34.0–46.6)
Hemoglobin: 9.5 g/dL — ABNORMAL LOW (ref 11.1–15.9)
MCH: 23.8 pg — ABNORMAL LOW (ref 26.6–33.0)
MCHC: 32.9 g/dL (ref 31.5–35.7)
MCV: 72 fL — ABNORMAL LOW (ref 79–97)
Platelets: 301 10*3/uL (ref 150–450)
RBC: 3.99 x10E6/uL (ref 3.77–5.28)
RDW: 14.6 % (ref 11.7–15.4)
WBC: 11.5 10*3/uL — ABNORMAL HIGH (ref 3.4–10.8)

## 2022-10-13 LAB — GLUCOSE TOLERANCE, 2 HOURS W/ 1HR
Glucose, 1 hour: 76 mg/dL (ref 70–179)
Glucose, 2 hour: 82 mg/dL (ref 70–152)
Glucose, Fasting: 69 mg/dL — ABNORMAL LOW (ref 70–91)

## 2022-10-13 LAB — RPR: RPR Ser Ql: NONREACTIVE

## 2022-10-13 LAB — HIV ANTIBODY (ROUTINE TESTING W REFLEX): HIV Screen 4th Generation wRfx: NONREACTIVE

## 2022-10-17 ENCOUNTER — Telehealth: Payer: Self-pay

## 2022-10-17 ENCOUNTER — Ambulatory Visit: Payer: Medicaid Other

## 2022-10-17 ENCOUNTER — Other Ambulatory Visit (HOSPITAL_BASED_OUTPATIENT_CLINIC_OR_DEPARTMENT_OTHER): Payer: Self-pay | Admitting: Obstetrics & Gynecology

## 2022-10-17 DIAGNOSIS — D509 Iron deficiency anemia, unspecified: Secondary | ICD-10-CM | POA: Insufficient documentation

## 2022-10-17 NOTE — Telephone Encounter (Signed)
Auth Submission: NO AUTH NEEDED Site of care: Site of care: [facility] Payer: Medicaid Medication & CPT/J Code(s) submitted: Venofer (Iron Sucrose) J1756  Auth type: Buy/Bill PB Units/visits requested: 200mg  x 5 doses  Approval from: 10/17/22 to 02/16/23

## 2022-10-21 ENCOUNTER — Ambulatory Visit: Payer: Medicaid Other | Admitting: *Deleted

## 2022-10-21 ENCOUNTER — Other Ambulatory Visit: Payer: Self-pay | Admitting: *Deleted

## 2022-10-21 ENCOUNTER — Ambulatory Visit (INDEPENDENT_AMBULATORY_CARE_PROVIDER_SITE_OTHER): Payer: Medicaid Other

## 2022-10-21 ENCOUNTER — Ambulatory Visit: Payer: Medicaid Other

## 2022-10-21 VITALS — BP 106/66 | HR 85 | Temp 98.3°F | Resp 18 | Ht 65.0 in | Wt 166.4 lb

## 2022-10-21 VITALS — BP 118/69 | HR 79

## 2022-10-21 DIAGNOSIS — Z3A28 28 weeks gestation of pregnancy: Secondary | ICD-10-CM

## 2022-10-21 DIAGNOSIS — D508 Other iron deficiency anemias: Secondary | ICD-10-CM

## 2022-10-21 DIAGNOSIS — Z8759 Personal history of other complications of pregnancy, childbirth and the puerperium: Secondary | ICD-10-CM

## 2022-10-21 DIAGNOSIS — O09293 Supervision of pregnancy with other poor reproductive or obstetric history, third trimester: Secondary | ICD-10-CM

## 2022-10-21 DIAGNOSIS — F17210 Nicotine dependence, cigarettes, uncomplicated: Secondary | ICD-10-CM | POA: Diagnosis not present

## 2022-10-21 DIAGNOSIS — O99013 Anemia complicating pregnancy, third trimester: Secondary | ICD-10-CM

## 2022-10-21 DIAGNOSIS — D563 Thalassemia minor: Secondary | ICD-10-CM | POA: Insufficient documentation

## 2022-10-21 DIAGNOSIS — O99332 Smoking (tobacco) complicating pregnancy, second trimester: Secondary | ICD-10-CM | POA: Diagnosis not present

## 2022-10-21 DIAGNOSIS — D509 Iron deficiency anemia, unspecified: Secondary | ICD-10-CM

## 2022-10-21 MED ORDER — DIPHENHYDRAMINE HCL 25 MG PO CAPS
25.0000 mg | ORAL_CAPSULE | Freq: Once | ORAL | Status: AC
  Administered 2022-10-21: 25 mg via ORAL
  Filled 2022-10-21: qty 1

## 2022-10-21 MED ORDER — SODIUM CHLORIDE 0.9 % IV SOLN
200.0000 mg | Freq: Once | INTRAVENOUS | Status: AC
  Administered 2022-10-21: 200 mg via INTRAVENOUS
  Filled 2022-10-21: qty 10

## 2022-10-21 MED ORDER — ACETAMINOPHEN 325 MG PO TABS
650.0000 mg | ORAL_TABLET | Freq: Once | ORAL | Status: AC
  Administered 2022-10-21: 650 mg via ORAL
  Filled 2022-10-21: qty 2

## 2022-10-21 NOTE — Progress Notes (Cosign Needed Addendum)
Diagnosis: Iron Deficiency Anemia  Provider:  Chilton Greathouse MD  Procedure: IV Infusion  IV Type: Peripheral, IV Location: R Antecubital  Venofer (Iron Sucrose), Dose: 200 mg  Infusion Start Time: 1354  Infusion Stop Time: 1409  Post Infusion IV Care: Observation period completed and Peripheral IV Discontinued  Discharge: Condition: Good, Destination: Home . AVS Provided  Performed by:  Adriana Mccallum, RN

## 2022-10-21 NOTE — Patient Instructions (Signed)
 Iron Sucrose Injection What is this medication? IRON SUCROSE (EYE ern SOO krose) treats low levels of iron (iron deficiency anemia) in people with kidney disease. Iron is a mineral that plays an important role in making red blood cells, which carry oxygen from your lungs to the rest of your body. This medicine may be used for other purposes; ask your health care provider or pharmacist if you have questions. COMMON BRAND NAME(S): Venofer What should I tell my care team before I take this medication? They need to know if you have any of these conditions: Anemia not caused by low iron levels Heart disease High levels of iron in the blood Kidney disease Liver disease An unusual or allergic reaction to iron, other medications, foods, dyes, or preservatives Pregnant or trying to get pregnant Breastfeeding How should I use this medication? This medication is for infusion into a vein. It is given in a hospital or clinic setting. Talk to your care team about the use of this medication in children. While this medication may be prescribed for children as young as 2 years for selected conditions, precautions do apply. Overdosage: If you think you have taken too much of this medicine contact a poison control center or emergency room at once. NOTE: This medicine is only for you. Do not share this medicine with others. What if I miss a dose? Keep appointments for follow-up doses. It is important not to miss your dose. Call your care team if you are unable to keep an appointment. What may interact with this medication? Do not take this medication with any of the following: Deferoxamine Dimercaprol Other iron products This medication may also interact with the following: Chloramphenicol Deferasirox This list may not describe all possible interactions. Give your health care provider a list of all the medicines, herbs, non-prescription drugs, or dietary supplements you use. Also tell them if you smoke,  drink alcohol, or use illegal drugs. Some items may interact with your medicine. What should I watch for while using this medication? Visit your care team regularly. Tell your care team if your symptoms do not start to get better or if they get worse. You may need blood work done while you are taking this medication. You may need to follow a special diet. Talk to your care team. Foods that contain iron include: whole grains/cereals, dried fruits, beans, or peas, leafy green vegetables, and organ meats (liver, kidney). What side effects may I notice from receiving this medication? Side effects that you should report to your care team as soon as possible: Allergic reactions--skin rash, itching, hives, swelling of the face, lips, tongue, or throat Low blood pressure--dizziness, feeling faint or lightheaded, blurry vision Shortness of breath Side effects that usually do not require medical attention (report to your care team if they continue or are bothersome): Flushing Headache Joint pain Muscle pain Nausea Pain, redness, or irritation at injection site This list may not describe all possible side effects. Call your doctor for medical advice about side effects. You may report side effects to FDA at 1-800-FDA-1088. Where should I keep my medication? This medication is given in a hospital or clinic. It will not be stored at home. NOTE: This sheet is a summary. It may not cover all possible information. If you have questions about this medicine, talk to your doctor, pharmacist, or health care provider.  2024 Elsevier/Gold Standard (2022-07-15 00:00:00)

## 2022-10-26 ENCOUNTER — Ambulatory Visit: Payer: Medicaid Other

## 2022-10-26 MED ORDER — DIPHENHYDRAMINE HCL 25 MG PO CAPS: 25.0000 mg | ORAL_CAPSULE | Freq: Once | ORAL | Status: DC

## 2022-10-26 MED ORDER — SODIUM CHLORIDE 0.9 % IV SOLN
200.0000 mg | Freq: Once | INTRAVENOUS | Status: DC
  Filled 2022-10-26: qty 10

## 2022-10-26 MED ORDER — ACETAMINOPHEN 325 MG PO TABS: 650.0000 mg | ORAL_TABLET | Freq: Once | ORAL | Status: DC

## 2022-10-26 NOTE — Progress Notes (Signed)
NO SHOW

## 2022-10-27 ENCOUNTER — Other Ambulatory Visit: Payer: Self-pay

## 2022-10-28 ENCOUNTER — Ambulatory Visit: Payer: Medicaid Other

## 2022-10-28 ENCOUNTER — Telehealth (HOSPITAL_BASED_OUTPATIENT_CLINIC_OR_DEPARTMENT_OTHER): Payer: Self-pay | Admitting: *Deleted

## 2022-10-28 MED ORDER — SODIUM CHLORIDE 0.9 % IV SOLN
200.0000 mg | Freq: Once | INTRAVENOUS | Status: DC
  Filled 2022-10-28: qty 10

## 2022-10-28 MED ORDER — ACETAMINOPHEN 325 MG PO TABS: 650.0000 mg | ORAL_TABLET | Freq: Once | ORAL | Status: DC

## 2022-10-28 MED ORDER — DIPHENHYDRAMINE HCL 25 MG PO CAPS: 25.0000 mg | ORAL_CAPSULE | Freq: Once | ORAL | Status: DC

## 2022-10-28 MED ORDER — FAMOTIDINE 40 MG PO TABS: 40.0000 mg | ORAL_TABLET | Freq: Every day | ORAL | 0 refills | Status: DC

## 2022-10-28 NOTE — Telephone Encounter (Signed)
Pt called reporting reflux and heartburn that has not been relieved with using OTC medications. Rx for pepcid sent to pharmacy.

## 2022-10-31 ENCOUNTER — Telehealth (HOSPITAL_BASED_OUTPATIENT_CLINIC_OR_DEPARTMENT_OTHER): Payer: Self-pay | Admitting: Obstetrics & Gynecology

## 2022-10-31 ENCOUNTER — Ambulatory Visit: Payer: Medicaid Other

## 2022-10-31 ENCOUNTER — Encounter (HOSPITAL_BASED_OUTPATIENT_CLINIC_OR_DEPARTMENT_OTHER): Payer: Medicaid Other | Admitting: Obstetrics & Gynecology

## 2022-10-31 MED ORDER — SODIUM CHLORIDE 0.9 % IV SOLN
200.0000 mg | Freq: Once | INTRAVENOUS | Status: DC
  Filled 2022-10-31: qty 10

## 2022-10-31 MED ORDER — DIPHENHYDRAMINE HCL 25 MG PO CAPS: 25.0000 mg | ORAL_CAPSULE | Freq: Once | ORAL | Status: DC

## 2022-10-31 MED ORDER — ACETAMINOPHEN 325 MG PO TABS: 650.0000 mg | ORAL_TABLET | Freq: Once | ORAL | Status: DC

## 2022-10-31 NOTE — Telephone Encounter (Signed)
Called patient and left a message to please call the office to reschedule today's missed appointment.

## 2022-11-02 ENCOUNTER — Ambulatory Visit: Payer: Medicaid Other

## 2022-11-03 ENCOUNTER — Other Ambulatory Visit (HOSPITAL_COMMUNITY)
Admission: RE | Admit: 2022-11-03 | Discharge: 2022-11-03 | Disposition: A | Discharge status: Home / Self Care | Payer: Medicaid Other | Source: Ambulatory Visit | Attending: Obstetrics & Gynecology | Admitting: Obstetrics & Gynecology

## 2022-11-03 ENCOUNTER — Ambulatory Visit (INDEPENDENT_AMBULATORY_CARE_PROVIDER_SITE_OTHER): Payer: Medicaid Other

## 2022-11-03 ENCOUNTER — Ambulatory Visit (HOSPITAL_BASED_OUTPATIENT_CLINIC_OR_DEPARTMENT_OTHER): Payer: Medicaid Other | Admitting: Obstetrics & Gynecology

## 2022-11-03 VITALS — BP 115/73 | HR 87 | Wt 169.4 lb

## 2022-11-03 VITALS — BP 103/67 | HR 67 | Temp 98.4°F | Resp 16 | Ht 65.0 in | Wt 167.8 lb

## 2022-11-03 DIAGNOSIS — D509 Iron deficiency anemia, unspecified: Secondary | ICD-10-CM

## 2022-11-03 DIAGNOSIS — A749 Chlamydial infection, unspecified: Secondary | ICD-10-CM | POA: Insufficient documentation

## 2022-11-03 DIAGNOSIS — Z3A30 30 weeks gestation of pregnancy: Secondary | ICD-10-CM | POA: Diagnosis not present

## 2022-11-03 DIAGNOSIS — O99013 Anemia complicating pregnancy, third trimester: Secondary | ICD-10-CM | POA: Diagnosis not present

## 2022-11-03 DIAGNOSIS — F17200 Nicotine dependence, unspecified, uncomplicated: Secondary | ICD-10-CM

## 2022-11-03 DIAGNOSIS — O99019 Anemia complicating pregnancy, unspecified trimester: Secondary | ICD-10-CM

## 2022-11-03 DIAGNOSIS — Z8619 Personal history of other infectious and parasitic diseases: Secondary | ICD-10-CM

## 2022-11-03 DIAGNOSIS — D563 Thalassemia minor: Secondary | ICD-10-CM

## 2022-11-03 DIAGNOSIS — O98811 Other maternal infectious and parasitic diseases complicating pregnancy, first trimester: Secondary | ICD-10-CM | POA: Insufficient documentation

## 2022-11-03 DIAGNOSIS — Z3480 Encounter for supervision of other normal pregnancy, unspecified trimester: Secondary | ICD-10-CM

## 2022-11-03 DIAGNOSIS — D508 Other iron deficiency anemias: Secondary | ICD-10-CM | POA: Diagnosis not present

## 2022-11-03 DIAGNOSIS — Z8759 Personal history of other complications of pregnancy, childbirth and the puerperium: Secondary | ICD-10-CM

## 2022-11-03 DIAGNOSIS — A549 Gonococcal infection, unspecified: Secondary | ICD-10-CM

## 2022-11-03 MED ORDER — ACETAMINOPHEN 325 MG PO TABS: 650.0000 mg | ORAL_TABLET | Freq: Once | ORAL | Status: DC

## 2022-11-03 MED ORDER — DIPHENHYDRAMINE HCL 25 MG PO CAPS: 25.0000 mg | ORAL_CAPSULE | Freq: Once | ORAL | Status: DC

## 2022-11-03 MED ORDER — SODIUM CHLORIDE 0.9 % IV SOLN
200.0000 mg | Freq: Once | INTRAVENOUS | Status: AC
  Administered 2022-11-03: 200 mg via INTRAVENOUS
  Filled 2022-11-03: qty 10

## 2022-11-03 NOTE — Progress Notes (Signed)
Diagnosis: Iron Deficiency Anemia  Provider:  Chilton Greathouse MD  Procedure: IV Infusion  IV Type: Peripheral, IV Location: R Antecubital  Venofer (Iron Sucrose), Dose: 200 mg  Infusion Start Time: 1306  Infusion Stop Time: 1330  Post Infusion IV Care: Observation period completed and Peripheral IV Discontinued  Discharge: Condition: Good, Destination: Home . AVS Declined  Performed by:  Rico Ala, LPN

## 2022-11-04 ENCOUNTER — Encounter: Payer: Self-pay | Admitting: Obstetrics & Gynecology

## 2022-11-04 LAB — CERVICOVAGINAL ANCILLARY ONLY
Chlamydia: NEGATIVE
Comment: NEGATIVE
Comment: NORMAL
Neisseria Gonorrhea: NEGATIVE
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
## 2022-11-07 ENCOUNTER — Ambulatory Visit (INDEPENDENT_AMBULATORY_CARE_PROVIDER_SITE_OTHER): Payer: Medicaid Other

## 2022-11-07 ENCOUNTER — Telehealth: Payer: Self-pay

## 2022-11-07 VITALS — BP 116/81 | HR 85 | Temp 97.4°F | Resp 18 | Ht 64.0 in | Wt 170.8 lb

## 2022-11-07 DIAGNOSIS — O99013 Anemia complicating pregnancy, third trimester: Secondary | ICD-10-CM | POA: Diagnosis not present

## 2022-11-07 DIAGNOSIS — Z3A31 31 weeks gestation of pregnancy: Secondary | ICD-10-CM | POA: Diagnosis not present

## 2022-11-07 DIAGNOSIS — D508 Other iron deficiency anemias: Secondary | ICD-10-CM

## 2022-11-07 DIAGNOSIS — O99011 Anemia complicating pregnancy, first trimester: Secondary | ICD-10-CM

## 2022-11-07 DIAGNOSIS — D509 Iron deficiency anemia, unspecified: Secondary | ICD-10-CM

## 2022-11-07 MED ORDER — ACETAMINOPHEN 325 MG PO TABS: 650.0000 mg | ORAL_TABLET | Freq: Once | ORAL | Status: DC

## 2022-11-07 MED ORDER — SODIUM CHLORIDE 0.9 % IV SOLN
200.0000 mg | Freq: Once | INTRAVENOUS | Status: AC
  Administered 2022-11-07: 200 mg via INTRAVENOUS
  Filled 2022-11-07: qty 10

## 2022-11-07 MED ORDER — DIPHENHYDRAMINE HCL 25 MG PO CAPS: 25.0000 mg | ORAL_CAPSULE | Freq: Once | ORAL | Status: DC

## 2022-11-07 NOTE — Progress Notes (Signed)
Diagnosis: Iron Deficiency Anemia  Provider:  Chilton Greathouse MD  Procedure: IV Infusion  IV Type: Peripheral, IV Location: R Antecubital  Venofer (Iron Sucrose), Dose: 200 mg  Infusion Start Time: 1540  Infusion Stop Time: 1558  Post Infusion IV Care: Patient declined observation and Peripheral IV Discontinued  Discharge: Condition: Good, Destination: Home . AVS Declined  Performed by:  Loney Hering, LPN

## 2022-11-07 NOTE — Telephone Encounter (Signed)
Auth Submission: NO AUTH NEEDED Site of care: Site of care: [facility] Payer: [employer] Medication & CPT/J Code(s) submitted: Venofer (Iron Sucrose) J1756 Route of submission (phone, fax, portal):  Phone # Fax # Auth type: Buy/Bill PB Units/visits requested: 200mg  x 5 doses Reference number:  Approval from: 11/07/22 to 02/21/23

## 2022-11-09 ENCOUNTER — Ambulatory Visit (INDEPENDENT_AMBULATORY_CARE_PROVIDER_SITE_OTHER): Payer: Medicaid Other

## 2022-11-09 VITALS — BP 109/72 | HR 92 | Temp 98.3°F | Resp 18 | Ht 64.0 in | Wt 171.4 lb

## 2022-11-09 DIAGNOSIS — D509 Iron deficiency anemia, unspecified: Secondary | ICD-10-CM

## 2022-11-09 DIAGNOSIS — O99013 Anemia complicating pregnancy, third trimester: Secondary | ICD-10-CM

## 2022-11-09 DIAGNOSIS — Z3A31 31 weeks gestation of pregnancy: Secondary | ICD-10-CM

## 2022-11-09 DIAGNOSIS — O99011 Anemia complicating pregnancy, first trimester: Secondary | ICD-10-CM

## 2022-11-09 DIAGNOSIS — D508 Other iron deficiency anemias: Secondary | ICD-10-CM | POA: Diagnosis not present

## 2022-11-09 MED ORDER — ACETAMINOPHEN 325 MG PO TABS: 650.0000 mg | ORAL_TABLET | Freq: Once | ORAL | Status: DC

## 2022-11-09 MED ORDER — SODIUM CHLORIDE 0.9 % IV SOLN
200.0000 mg | Freq: Once | INTRAVENOUS | Status: AC
  Administered 2022-11-09: 200 mg via INTRAVENOUS
  Filled 2022-11-09: qty 10

## 2022-11-09 MED ORDER — DIPHENHYDRAMINE HCL 25 MG PO CAPS: 25.0000 mg | ORAL_CAPSULE | Freq: Once | ORAL | Status: DC

## 2022-11-09 NOTE — Progress Notes (Signed)
Diagnosis: Iron Deficiency Anemia  Provider:  Chilton Greathouse MD  Procedure: IV Infusion  IV Type: Peripheral, IV Location: R Antecubital  Venofer (Iron Sucrose), Dose: 200 mg  Infusion Start Time: 1543  Infusion Stop Time: 1559  Post Infusion IV Care: Patient declined observation and Peripheral IV Discontinued  Discharge: Condition: Good, Destination: Home . AVS Declined  Performed by:  Loney Hering, LPN

## 2022-11-11 ENCOUNTER — Ambulatory Visit (INDEPENDENT_AMBULATORY_CARE_PROVIDER_SITE_OTHER): Payer: Medicaid Other

## 2022-11-11 VITALS — BP 104/72 | HR 99 | Temp 98.3°F | Resp 16 | Ht 64.0 in | Wt 168.4 lb

## 2022-11-11 DIAGNOSIS — Z3A30 30 weeks gestation of pregnancy: Secondary | ICD-10-CM

## 2022-11-11 DIAGNOSIS — D509 Iron deficiency anemia, unspecified: Secondary | ICD-10-CM

## 2022-11-11 DIAGNOSIS — O99013 Anemia complicating pregnancy, third trimester: Secondary | ICD-10-CM

## 2022-11-11 DIAGNOSIS — D508 Other iron deficiency anemias: Secondary | ICD-10-CM

## 2022-11-11 DIAGNOSIS — O99011 Anemia complicating pregnancy, first trimester: Secondary | ICD-10-CM

## 2022-11-11 MED ORDER — SODIUM CHLORIDE 0.9 % IV SOLN
200.0000 mg | Freq: Once | INTRAVENOUS | Status: AC
  Administered 2022-11-11: 200 mg via INTRAVENOUS
  Filled 2022-11-11: qty 10

## 2022-11-11 MED ORDER — ACETAMINOPHEN 325 MG PO TABS: 650.0000 mg | ORAL_TABLET | Freq: Once | ORAL | Status: DC

## 2022-11-11 MED ORDER — DIPHENHYDRAMINE HCL 25 MG PO CAPS: 25.0000 mg | ORAL_CAPSULE | Freq: Once | ORAL | Status: DC

## 2022-11-11 NOTE — Progress Notes (Signed)
Diagnosis: Iron Deficiency Anemia  Provider:  Chilton Greathouse MD  Procedure: IV Infusion  IV Type: Peripheral, IV Location: R Antecubital  Venofer (Iron Sucrose), Dose: 200 mg  Infusion Start Time: 1545  Infusion Stop Time: 1603  Post Infusion IV Care: Patient declined observation and Peripheral IV Discontinued  Discharge: Condition: Good, Destination: Home . AVS Declined  Performed by:  Rico Ala, LPN

## 2022-11-14 ENCOUNTER — Ambulatory Visit (HOSPITAL_BASED_OUTPATIENT_CLINIC_OR_DEPARTMENT_OTHER): Payer: Medicaid Other | Admitting: Obstetrics & Gynecology

## 2022-11-14 ENCOUNTER — Encounter (HOSPITAL_BASED_OUTPATIENT_CLINIC_OR_DEPARTMENT_OTHER): Payer: Self-pay | Admitting: Obstetrics & Gynecology

## 2022-11-14 VITALS — BP 110/76 | HR 87 | Wt 168.0 lb

## 2022-11-14 DIAGNOSIS — A749 Chlamydial infection, unspecified: Secondary | ICD-10-CM

## 2022-11-14 DIAGNOSIS — Z3480 Encounter for supervision of other normal pregnancy, unspecified trimester: Secondary | ICD-10-CM

## 2022-11-14 DIAGNOSIS — D563 Thalassemia minor: Secondary | ICD-10-CM

## 2022-11-14 DIAGNOSIS — A549 Gonococcal infection, unspecified: Secondary | ICD-10-CM

## 2022-11-14 DIAGNOSIS — O09893 Supervision of other high risk pregnancies, third trimester: Secondary | ICD-10-CM

## 2022-11-14 DIAGNOSIS — R87612 Low grade squamous intraepithelial lesion on cytologic smear of cervix (LGSIL): Secondary | ICD-10-CM

## 2022-11-14 DIAGNOSIS — Z3A32 32 weeks gestation of pregnancy: Secondary | ICD-10-CM

## 2022-11-14 DIAGNOSIS — O99013 Anemia complicating pregnancy, third trimester: Secondary | ICD-10-CM

## 2022-11-14 DIAGNOSIS — M25551 Pain in right hip: Secondary | ICD-10-CM

## 2022-11-14 DIAGNOSIS — F17200 Nicotine dependence, unspecified, uncomplicated: Secondary | ICD-10-CM

## 2022-11-14 DIAGNOSIS — O09899 Supervision of other high risk pregnancies, unspecified trimester: Secondary | ICD-10-CM
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
## 2022-11-17 ENCOUNTER — Ambulatory Visit: Payer: Medicaid Other | Attending: Obstetrics and Gynecology

## 2022-11-17 DIAGNOSIS — O09293 Supervision of pregnancy with other poor reproductive or obstetric history, third trimester: Secondary | ICD-10-CM

## 2022-11-17 DIAGNOSIS — O99013 Anemia complicating pregnancy, third trimester: Secondary | ICD-10-CM | POA: Diagnosis not present

## 2022-11-17 DIAGNOSIS — D649 Anemia, unspecified: Secondary | ICD-10-CM

## 2022-11-17 DIAGNOSIS — O99333 Smoking (tobacco) complicating pregnancy, third trimester: Secondary | ICD-10-CM

## 2022-11-17 DIAGNOSIS — Z8759 Personal history of other complications of pregnancy, childbirth and the puerperium: Secondary | ICD-10-CM | POA: Insufficient documentation

## 2022-11-17 DIAGNOSIS — F17210 Nicotine dependence, cigarettes, uncomplicated: Secondary | ICD-10-CM

## 2022-11-17 DIAGNOSIS — O285 Abnormal chromosomal and genetic finding on antenatal screening of mother: Secondary | ICD-10-CM

## 2022-11-17 DIAGNOSIS — D563 Thalassemia minor: Secondary | ICD-10-CM

## 2022-11-17 DIAGNOSIS — Z3A32 32 weeks gestation of pregnancy: Secondary | ICD-10-CM

## 2022-11-18 ENCOUNTER — Ambulatory Visit: Payer: Medicaid Other

## 2022-11-18 DIAGNOSIS — Z8759 Personal history of other complications of pregnancy, childbirth and the puerperium: Secondary | ICD-10-CM

## 2022-11-21 ENCOUNTER — Ambulatory Visit: Payer: Medicaid Other | Admitting: Sports Medicine

## 2022-11-21 ENCOUNTER — Encounter (HOSPITAL_BASED_OUTPATIENT_CLINIC_OR_DEPARTMENT_OTHER): Payer: Self-pay | Admitting: Obstetrics & Gynecology

## 2022-11-21 NOTE — Progress Notes (Deleted)
    [name] [name] [address] [place] [address] Phone: [phone]   Assessment and Plan:     There are no diagnoses linked to this encounter.  ***   Pertinent previous records reviewed include ***   Follow Up: ***     Subjective:   I, Alexis [name], am serving as a Neurosurgeon for Doctor Richardean Sale  Chief Complaint: right hip pain   HPI:   11/21/22 Patient is a 27 year old female complaining of right hip pain. Patient states  Relevant Historical Information: ***  Additional pertinent review of systems negative.   Current Outpatient Medications:    acetaminophen (TYLENOL) 325 MG tablet, Take 2 tablets (650 mg total) by mouth every 4 (four) hours as needed (for pain scale < 4)., Disp: 30 tablet, Rfl: 0   acetaminophen (TYLENOL) 500 MG tablet, Take 500 mg by mouth every 6 (six) hours as needed., Disp: , Rfl:    famotidine (PEPCID) 40 MG tablet, Take 1 tablet (40 mg total) by mouth daily. Take 1 tablet 1-2 times daily, Disp: 60 tablet, Rfl: 0   ferrous sulfate 325 (65 FE) MG EC tablet, Take 1 tablet (325 mg total) by mouth every other day., Disp: 30 tablet, Rfl: 2   Prenatal Vit-Fe Fumarate-FA (PRENATAL PLUS VITAMIN/MINERAL) 27-1 MG TABS, Take 1 tablet by mouth daily., Disp: 30 tablet, Rfl: 11   Objective:     There were no vitals filed for this visit.    There is no height or weight on file to calculate BMI.    Physical Exam:    ***   Electronically signed by:  [name] [name] 7:32 AM 11/21/22

## 2022-11-21 NOTE — Telephone Encounter (Signed)
Patient called the office. Patient states she does not have the pregnancy packet that was given to her. I went over all the things on the list that patient could take for cough and cold. tbw

## 2022-11-23 NOTE — Progress Notes (Unsigned)
   [name] [name] [address] [place] [address] Phone: [phone]   Assessment and Plan:     There are no diagnoses linked to this encounter.  *** - Patient has received relief with OMT in the past.  Elects for repeat OMT today.  Tolerated well per note below. - Decision today to treat with OMT was based on Physical Exam   After verbal consent patient was treated with HVLA (high velocity low amplitude), ME (muscle energy), FPR (flex positional release), ST (soft tissue), PC/PD (Pelvic Compression/ Pelvic Decompression) techniques in cervical, rib, thoracic, lumbar, and pelvic areas. Patient tolerated the procedure well with improvement in symptoms.  Patient educated on potential side effects of soreness and recommended to rest, hydrate, and use Tylenol as needed for pain control.   Pertinent previous records reviewed include ***   Follow Up: ***     Subjective:   I, Shontelle Muska, am serving as a Neurosurgeon for Doctor Richardean Sale  Chief Complaint: right hip pain   HPI:   11/24/2022 Patient is a 27 year old female complaining of right hip pain. Patient states  Relevant Historical Information: ***  Additional pertinent review of systems negative.  Current Outpatient Medications  Medication Sig Dispense Refill   acetaminophen (TYLENOL) 325 MG tablet Take 2 tablets (650 mg total) by mouth every 4 (four) hours as needed (for pain scale < 4). 30 tablet 0   acetaminophen (TYLENOL) 500 MG tablet Take 500 mg by mouth every 6 (six) hours as needed.     famotidine (PEPCID) 40 MG tablet Take 1 tablet (40 mg total) by mouth daily. Take 1 tablet 1-2 times daily 60 tablet 0   ferrous sulfate 325 (65 FE) MG EC tablet Take 1 tablet (325 mg total) by mouth every other day. 30 tablet 2   Prenatal Vit-Fe Fumarate-FA (PRENATAL PLUS VITAMIN/MINERAL) 27-1 MG TABS Take 1 tablet by mouth daily. 30 tablet 11   No current facility-administered medications  for this visit.      Objective:     There were no vitals filed for this visit.    There is no height or weight on file to calculate BMI.    Physical Exam:     General: Well-appearing, cooperative, sitting comfortably in no acute distress.   OMT Physical Exam:  ASIS Compression Test: Positive Right Cervical: TTP paraspinal, *** Rib: Bilateral elevated first rib with TTP Thoracic: TTP paraspinal,*** Lumbar: TTP paraspinal,*** Pelvis: Right anterior innominate  Electronically signed by:  [name] [name] 7:41 AM 11/23/22

## 2022-11-24 ENCOUNTER — Ambulatory Visit: Payer: Medicaid Other | Admitting: Sports Medicine

## 2022-11-24 VITALS — BP 110/82 | HR 101 | Ht 64.0 in | Wt 170.0 lb

## 2022-11-24 DIAGNOSIS — G5601 Carpal tunnel syndrome, right upper limb: Secondary | ICD-10-CM | POA: Diagnosis not present

## 2022-11-24 DIAGNOSIS — M9905 Segmental and somatic dysfunction of pelvic region: Secondary | ICD-10-CM

## 2022-11-24 DIAGNOSIS — M25551 Pain in right hip: Secondary | ICD-10-CM

## 2022-11-24 DIAGNOSIS — Z3A33 33 weeks gestation of pregnancy: Secondary | ICD-10-CM | POA: Diagnosis not present

## 2022-11-24 DIAGNOSIS — M9903 Segmental and somatic dysfunction of lumbar region: Secondary | ICD-10-CM

## 2022-11-24 DIAGNOSIS — M9904 Segmental and somatic dysfunction of sacral region: Secondary | ICD-10-CM

## 2022-11-24 NOTE — Patient Instructions (Addendum)
Tylenol [phone] mg 2-3 times a day for pain relief  Hip HEP Recommend getting a carpal tunnel night splint to wear at night time  4 week follow up

## 2022-11-28 ENCOUNTER — Encounter (HOSPITAL_BASED_OUTPATIENT_CLINIC_OR_DEPARTMENT_OTHER): Payer: Self-pay | Admitting: *Deleted

## 2022-11-29 ENCOUNTER — Encounter (HOSPITAL_COMMUNITY): Payer: Self-pay | Admitting: Obstetrics and Gynecology

## 2022-11-29 ENCOUNTER — Encounter (HOSPITAL_BASED_OUTPATIENT_CLINIC_OR_DEPARTMENT_OTHER): Payer: Medicaid Other | Admitting: Obstetrics & Gynecology

## 2022-11-29 ENCOUNTER — Inpatient Hospital Stay (HOSPITAL_COMMUNITY)
Admission: AD | Admit: 2022-11-29 | Discharge: 2022-11-29 | Disposition: A | Discharge status: Home / Self Care | Payer: Medicaid Other | Attending: Obstetrics and Gynecology | Admitting: Obstetrics and Gynecology

## 2022-11-29 ENCOUNTER — Telehealth (HOSPITAL_BASED_OUTPATIENT_CLINIC_OR_DEPARTMENT_OTHER): Payer: Self-pay | Admitting: *Deleted

## 2022-11-29 DIAGNOSIS — M549 Dorsalgia, unspecified: Secondary | ICD-10-CM | POA: Diagnosis not present

## 2022-11-29 DIAGNOSIS — M545 Low back pain, unspecified: Secondary | ICD-10-CM | POA: Insufficient documentation

## 2022-11-29 DIAGNOSIS — Z3A34 34 weeks gestation of pregnancy: Secondary | ICD-10-CM | POA: Diagnosis not present

## 2022-11-29 DIAGNOSIS — O26893 Other specified pregnancy related conditions, third trimester: Secondary | ICD-10-CM | POA: Diagnosis not present

## 2022-11-29 DIAGNOSIS — O4703 False labor before 37 completed weeks of gestation, third trimester: Secondary | ICD-10-CM | POA: Insufficient documentation

## 2022-11-29 DIAGNOSIS — R102 Pelvic and perineal pain: Secondary | ICD-10-CM | POA: Diagnosis present

## 2022-11-29 DIAGNOSIS — G8929 Other chronic pain: Secondary | ICD-10-CM | POA: Diagnosis not present

## 2022-11-29 DIAGNOSIS — R252 Cramp and spasm: Secondary | ICD-10-CM | POA: Diagnosis not present

## 2022-11-29 DIAGNOSIS — O99891 Other specified diseases and conditions complicating pregnancy: Secondary | ICD-10-CM | POA: Diagnosis not present

## 2022-11-29 LAB — COMPREHENSIVE METABOLIC PANEL
ALT: 9 U/L (ref 0–44)
AST: 12 U/L — ABNORMAL LOW (ref 15–41)
Albumin: 2.6 g/dL — ABNORMAL LOW (ref 3.5–5.0)
Alkaline Phosphatase: 70 U/L (ref 38–126)
Anion gap: 12 (ref 5–15)
BUN: <5 mg/dL — ABNORMAL LOW (ref 6–20)
CO2: 21 mmol/L — ABNORMAL LOW (ref 22–32)
Calcium: 8.6 mg/dL — ABNORMAL LOW (ref 8.9–10.3)
Chloride: 105 mmol/L (ref 98–111)
Creatinine, Ser: 0.42 mg/dL — ABNORMAL LOW (ref 0.44–1.00)
GFR, Estimated: >60 mL/min (ref >60)
Glucose, Bld: 84 mg/dL (ref 70–99)
Potassium: 3.6 mmol/L (ref 3.5–5.1)
Sodium: 138 mmol/L (ref 135–145)
Total Bilirubin: 0.2 mg/dL — ABNORMAL LOW (ref 0.3–1.2)
Total Protein: 5.6 g/dL — ABNORMAL LOW (ref 6.5–8.1)

## 2022-11-29 LAB — URINALYSIS, ROUTINE W REFLEX MICROSCOPIC
Bilirubin Urine: NEGATIVE
Glucose, UA: NEGATIVE mg/dL
Hgb urine dipstick: NEGATIVE
Ketones, ur: NEGATIVE mg/dL
Nitrite: NEGATIVE
Protein, ur: NEGATIVE mg/dL
Specific Gravity, Urine: 1.012 (ref 1.005–1.030)
pH: 7 (ref 5.0–8.0)

## 2022-11-29 LAB — CBC
HCT: 27.3 % — ABNORMAL LOW (ref 36.0–46.0)
Hemoglobin: 9 g/dL — ABNORMAL LOW (ref 12.0–15.0)
MCH: 23.5 pg — ABNORMAL LOW (ref 26.0–34.0)
MCHC: 33 g/dL (ref 30.0–36.0)
MCV: 71.3 fL — ABNORMAL LOW (ref 80.0–100.0)
Platelets: 260 10*3/uL (ref 150–400)
RBC: 3.83 MIL/uL — ABNORMAL LOW (ref 3.87–5.11)
RDW: 15.2 % (ref 11.5–15.5)
WBC: 11.7 10*3/uL — ABNORMAL HIGH (ref 4.0–10.5)
nRBC: 0 % (ref 0.0–0.2)

## 2022-11-29 MED ORDER — CYCLOBENZAPRINE HCL 5 MG PO TABS: 5.0000 mg | ORAL_TABLET | Freq: Three times a day (TID) | ORAL | 0 refills | Status: DC | PRN

## 2022-11-29 MED ORDER — CYCLOBENZAPRINE HCL 5 MG PO TABS
10.0000 mg | ORAL_TABLET | Freq: Once | ORAL | Status: AC
  Administered 2022-11-29: 10 mg via ORAL
  Filled 2022-11-29: qty 2

## 2022-11-29 MED ORDER — NIFEDIPINE 10 MG PO CAPS
10.0000 mg | ORAL_CAPSULE | ORAL | Status: DC | PRN
  Administered 2022-11-29 (×3): 10 mg via ORAL
  Filled 2022-11-29 (×3): qty 1

## 2022-11-29 NOTE — MAU Note (Signed)
.  Alexis [name] is a 27 y.o. at [redacted]w[redacted]d here in MAU reporting:    Mucous plug came out at 9am-10pm it was pink/brown. No current bleeding,NO LOF,+FM. Ever since mucous plug came out pt states she has had pressure on her abdomen more toward her sides Right and Left side rates 8/10 pain score. Pt also states having pelvic pressure "its like the baby is trying to position it self" 8/10   Vitals:   11/29/22 2113  BP: 119/65  Pulse: 89  Resp: 16  Temp: 98.2 F (36.8 C)     FHT:154 Lab orders placed from triage:   UA

## 2022-11-29 NOTE — Telephone Encounter (Signed)
Pt called with reports of completely losing her mucous plug. She reports feeling contractions every 8-10 minutes and increased pelvic pressure. She denies LOF or vaginal bleeding. She reports good fetal movement. Advised pt that we do not have a provider in office today so [facility]. Pt verbalized understanding.

## 2022-11-29 NOTE — MAU Provider Note (Signed)
Chief Complaint:  Abdominal Pain and Pelvic Pain  Seen by provider at 2115   HPI: Alexis [name] is a 27 y.o. [id number] at 37w3dwho presents to maternity admissions reporting loss of mucous plug, .States has pressure and some pain in lower abdomen. Also has chronic back pain/spasm She reports good fetal movement, denies LOF, vaginal bleeding, urinary symptoms, n/v, diarrhea, constipation or fever/chills.    Pelvic Pain The patient's primary symptoms include pelvic pain. The patient's pertinent negatives include no genital odor or vaginal bleeding. The current episode started today. She is pregnant. Associated symptoms include abdominal pain. Pertinent negatives include no chills, constipation or diarrhea. Nothing aggravates the symptoms. She has tried nothing for the symptoms.   RN Note: Mucous plug came out at 9am-10pm it was pink/brown. No current bleeding,NO LOF,+FM. Ever since mucous plug came out pt states she has had pressure on her abdomen more toward her sides Right and Left side rates 8/10 pain score. Pt also states having pelvic pressure "its like the baby is trying to position it self" 8/10   Past Medical History: Past Medical History:  Diagnosis Date   Beta thalassemia trait    Family history of breast cancer in mother    Gonorrhea 12/2020   treated   Marijuana abuse    Seizures (HCC)    last seizure in 2021   Shingles outbreak 10/2018   Trichomonas infection 02/2021    Past obstetric history: OB History  Gravida Para Term Preterm AB Living  4 3 3     3   SAB IAB Ectopic Multiple Live Births        0 3    # Outcome Date GA Lbr Len/2nd Weight Sex Type Anes PTL Lv  4 Current           3 Term 10/29/21 [redacted]w[redacted]d 01:00 / 00:01 2480 g M Vag-Spont EPI  LIV     Birth Comments: WNL  2 Term 02/08/19 [redacted]w[redacted]d 16:10 / 01:46 2705 g F Vag-Spont EPI  LIV  1 Term 10/27/09 [redacted]w[redacted]d  2977 g M Vag-Spont   LIV    Past Surgical History: Past Surgical History:  Procedure Laterality Date   NO  PAST SURGERIES      Family History: Family History  Problem Relation Age of Onset   Cancer Mother 28       breast   Heart disease Father    Heart disease Paternal Grandmother    Heart disease Paternal Grandfather     Social History: Social History   Tobacco Use   Smoking status: Every Day    Current packs/day: 0.50    Types: Cigarettes    Passive exposure: Never   Smokeless tobacco: Never   Tobacco comments:    5=6 cigs a day  Vaping Use   Vaping status: Never Used  Substance Use Topics   Alcohol use: Not Currently   Drug use: Not Currently    Types: Marijuana    Comment: not since +UPT    Allergies: No Known Allergies  Meds:  Medications Prior to Admission  Medication Sig Dispense Refill Last Dose   Prenatal Vit-Fe Fumarate-FA (PRENATAL PLUS VITAMIN/MINERAL) 27-1 MG TABS Take 1 tablet by mouth daily. 30 tablet 11 11/29/2022   acetaminophen (TYLENOL) 325 MG tablet Take 2 tablets (650 mg total) by mouth every 4 (four) hours as needed (for pain scale < 4). 30 tablet 0    acetaminophen (TYLENOL) 500 MG tablet Take 500 mg by mouth every 6 (six) hours  as needed.      famotidine (PEPCID) 40 MG tablet Take 1 tablet (40 mg total) by mouth daily. Take 1 tablet 1-2 times daily 60 tablet 0    ferrous sulfate 325 (65 FE) MG EC tablet Take 1 tablet (325 mg total) by mouth every other day. 30 tablet 2     I have reviewed patient's Past Medical Hx, Surgical Hx, Family Hx, Social Hx, medications and allergies.   ROS:  Review of Systems  Constitutional:  Negative for chills.  Gastrointestinal:  Positive for abdominal pain. Negative for constipation and diarrhea.  Genitourinary:  Positive for pelvic pain.   Other systems negative  Physical Exam  Patient Vitals for the past 24 hrs:  BP Temp Temp src Pulse Resp Height Weight  11/29/22 2113 119/65 98.2 F (36.8 C) Oral 89 16 5\' 4"  (1.626 m) 78.6 kg   Constitutional: Well-developed, well-nourished female in no acute distress.   Cardiovascular: normal rate and rhythm Respiratory: normal effort GI: Abd soft, non-tender, gravid appropriate for gestational age.   No rebound or guarding. MS: Extremities nontender, no edema, normal ROM Neurologic: Alert and oriented x 4.  GU: Neg CVAT.  PELVIC EXAM: Dilation: 1 Station: -3 Exam by:: Wynelle Bourgeois, CNM Cervix is long, not effaced   FHT:  Baseline 135 , moderate variability, accelerations present, no decelerations Contractions: uterine irritability   Labs: Results for orders placed or performed during the hospital encounter of 11/29/22 (from the past 24 hour(s))  Urinalysis, Routine w reflex microscopic -Urine, Clean Catch     Status: Abnormal   Collection Time: 11/29/22  9:28 PM  Result Value Ref Range   Color, Urine YELLOW YELLOW   APPearance HAZY (A) CLEAR   Specific Gravity, Urine 1.012 1.005 - 1.030   pH 7.0 5.0 - 8.0   Glucose, UA NEGATIVE NEGATIVE mg/dL   Hgb urine dipstick NEGATIVE NEGATIVE   Bilirubin Urine NEGATIVE NEGATIVE   Ketones, ur NEGATIVE NEGATIVE mg/dL   Protein, ur NEGATIVE NEGATIVE mg/dL   Nitrite NEGATIVE NEGATIVE   Leukocytes,Ua LARGE (A) NEGATIVE   RBC / HPF 11-20 0 - 5 RBC/hpf   WBC, UA 21-50 0 - 5 WBC/hpf   Bacteria, UA FEW (A) NONE SEEN   Squamous Epithelial / HPF 11-20 0 - 5 /HPF   Mucus PRESENT   CBC     Status: Abnormal   Collection Time: 11/29/22  9:48 PM  Result Value Ref Range   WBC 11.7 (H) 4.0 - 10.5 K/uL   RBC 3.83 (L) 3.87 - 5.11 MIL/uL   Hemoglobin 9.0 (L) 12.0 - 15.0 g/dL   HCT 16.1 (L) 09.6 - 04.5 %   MCV 71.3 (L) 80.0 - 100.0 fL   MCH 23.5 (L) 26.0 - 34.0 pg   MCHC 33.0 30.0 - 36.0 g/dL   RDW 40.9 81.1 - 91.4 %   Platelets 260 150 - 400 K/uL   nRBC 0.0 0.0 - 0.2 %  Comprehensive metabolic panel     Status: Abnormal   Collection Time: 11/29/22  9:48 PM  Result Value Ref Range   Sodium 138 135 - 145 mmol/L   Potassium 3.6 3.5 - 5.1 mmol/L   Chloride 105 98 - 111 mmol/L   CO2 21 (L) 22 - 32 mmol/L    Glucose, Bld 84 70 - 99 mg/dL   BUN <5 (L) 6 - 20 mg/dL   Creatinine, Ser 7.82 (L) 0.44 - 1.00 mg/dL   Calcium 8.6 (L) 8.9 - 10.3 mg/dL  Total Protein 5.6 (L) 6.5 - 8.1 g/dL   Albumin 2.6 (L) 3.5 - 5.0 g/dL   AST 12 (L) 15 - 41 U/L   ALT 9 0 - 44 U/L   Alkaline Phosphatase 70 38 - 126 U/L   Total Bilirubin 0.2 (L) 0.3 - 1.2 mg/dL   GFR, Estimated >16 >10 mL/min   Anion gap 12 5 - 15   Urine sent to culture  O/Positive/-- (03/18 1603)  Imaging:    MAU Course/MDM: I have reviewed the triage vital signs and the nursing notes.   Pertinent labs & imaging results that were available during my care of the patient were reviewed by me and considered in my medical decision making (see chart for details).      I have reviewed her medical records including past results, notes and treatments.   I have ordered labs and reviewed results. Will not treat for UTI now, sent to culture NST reviewed  Treatments in MAU included Procarida x 3 which stopped uterine irritability. States now her back hurts, Flexeril given for this. .    Assessment: Single IUP at [redacted]w[redacted]d Threatened preterm labor Low back pain/spasm  Plan: Discharge home Rx Flexeril for back pain Preterm Labor precautions and fetal kick counts Follow up in Office for prenatal visits and recheck Encouraged to return if she develops worsening of symptoms, increase in pain, fever, or other concerning symptoms.   Pt stable at time of discharge.  Wynelle Bourgeois CNM, MSN Certified Nurse-Midwife 11/29/2022 9:23 PM

## 2022-11-30 LAB — CULTURE, OB URINE: Culture: >=100000 — AB

## 2022-12-01 ENCOUNTER — Encounter (HOSPITAL_BASED_OUTPATIENT_CLINIC_OR_DEPARTMENT_OTHER): Payer: Self-pay | Admitting: Obstetrics & Gynecology

## 2022-12-01 ENCOUNTER — Encounter (HOSPITAL_BASED_OUTPATIENT_CLINIC_OR_DEPARTMENT_OTHER): Payer: Medicaid Other | Admitting: Certified Nurse Midwife

## 2022-12-01 ENCOUNTER — Encounter (HOSPITAL_BASED_OUTPATIENT_CLINIC_OR_DEPARTMENT_OTHER): Payer: Self-pay

## 2022-12-01 NOTE — Progress Notes (Deleted)
Declined flu, Tdap UTD Breast or bottlefeeding? Pediatrician? Epidural?

## 2022-12-01 NOTE — Assessment & Plan Note (Deleted)
[place] (11/17/22) Hx of FGR: Vtx, anterior placenta, AFI wnl, EFW 4lb 5oz (31%), HC 5%, AC 43%, FL 33%. F/U growth in 5 weeks.

## 2022-12-05 ENCOUNTER — Encounter (HOSPITAL_BASED_OUTPATIENT_CLINIC_OR_DEPARTMENT_OTHER): Payer: Self-pay

## 2022-12-05 ENCOUNTER — Encounter (HOSPITAL_COMMUNITY): Payer: Self-pay | Admitting: Family Medicine

## 2022-12-05 ENCOUNTER — Inpatient Hospital Stay (HOSPITAL_COMMUNITY)
Admission: AD | Admit: 2022-12-05 | Discharge: 2022-12-05 | Disposition: A | Discharge status: Home / Self Care | Payer: Medicaid Other | Attending: Family Medicine | Admitting: Family Medicine

## 2022-12-05 ENCOUNTER — Telehealth (HOSPITAL_BASED_OUTPATIENT_CLINIC_OR_DEPARTMENT_OTHER): Payer: Self-pay | Admitting: Certified Nurse Midwife

## 2022-12-05 ENCOUNTER — Encounter (HOSPITAL_BASED_OUTPATIENT_CLINIC_OR_DEPARTMENT_OTHER): Payer: Medicaid Other | Admitting: Certified Nurse Midwife

## 2022-12-05 ENCOUNTER — Other Ambulatory Visit (HOSPITAL_BASED_OUTPATIENT_CLINIC_OR_DEPARTMENT_OTHER): Payer: Self-pay | Admitting: Certified Nurse Midwife

## 2022-12-05 DIAGNOSIS — N898 Other specified noninflammatory disorders of vagina: Secondary | ICD-10-CM

## 2022-12-05 DIAGNOSIS — A599 Trichomoniasis, unspecified: Secondary | ICD-10-CM

## 2022-12-05 DIAGNOSIS — Z3A35 35 weeks gestation of pregnancy: Secondary | ICD-10-CM | POA: Diagnosis not present

## 2022-12-05 DIAGNOSIS — O98513 Other viral diseases complicating pregnancy, third trimester: Secondary | ICD-10-CM | POA: Insufficient documentation

## 2022-12-05 DIAGNOSIS — O4703 False labor before 37 completed weeks of gestation, third trimester: Secondary | ICD-10-CM | POA: Diagnosis present

## 2022-12-05 DIAGNOSIS — A5901 Trichomonal vulvovaginitis: Secondary | ICD-10-CM | POA: Insufficient documentation

## 2022-12-05 DIAGNOSIS — Z3493 Encounter for supervision of normal pregnancy, unspecified, third trimester: Secondary | ICD-10-CM

## 2022-12-05 DIAGNOSIS — Z0371 Encounter for suspected problem with amniotic cavity and membrane ruled out: Secondary | ICD-10-CM | POA: Insufficient documentation

## 2022-12-05 LAB — URINALYSIS, ROUTINE W REFLEX MICROSCOPIC
Bilirubin Urine: NEGATIVE
Glucose, UA: NEGATIVE mg/dL
Hgb urine dipstick: NEGATIVE
Ketones, ur: NEGATIVE mg/dL
Nitrite: NEGATIVE
Protein, ur: 30 mg/dL — AB
Specific Gravity, Urine: 1.026 (ref 1.005–1.030)
WBC, UA: >50 WBC/hpf (ref 0–5)
pH: 6 (ref 5.0–8.0)

## 2022-12-05 LAB — GC/CHLAMYDIA PROBE AMP (~~LOC~~) NOT AT ARMC
Chlamydia: NEGATIVE
Comment: NEGATIVE
Comment: NORMAL
Neisseria Gonorrhea: NEGATIVE

## 2022-12-05 LAB — WET PREP, GENITAL
Clue Cells Wet Prep HPF POC: NONE SEEN
Sperm: NONE SEEN
WBC, Wet Prep HPF POC: >=10 — AB (ref <10)
Yeast Wet Prep HPF POC: NONE SEEN

## 2022-12-05 MED ORDER — METRONIDAZOLE 500 MG PO TABS
500.0000 mg | ORAL_TABLET | Freq: Two times a day (BID) | ORAL | 0 refills | Status: DC
Start: 2022-12-05 — End: 2022-12-16

## 2022-12-05 NOTE — MAU Provider Note (Signed)
History     CSN: [id number]  Arrival date and time: 12/05/22 4098   Event Date/Time   First Provider Initiated Contact with Patient 12/05/22 0112      Chief Complaint  Patient presents with   Contractions   Alexis [name] , a  27 y.o. [id number] at [redacted]w[redacted]d presents to MAU with complaints of SROM and contractions. Patient reports a "pop" sound and gush of fluid around 1150pm. She states fluid was clear and reports that it soaked her panties and pants. She denies currently leaking and denies wearing a pad. She also reports contractions every 4 mins lasting about 1 min that started about midnight. She denies abnormal vaginal discharge, vaginal bleeding or urinary symptoms. Last intercourse was 3 days ago . She endorses positive fetal movement.          OB History     Gravida  4   Para  3   Term  3   Preterm      AB      Living  3      SAB      IAB      Ectopic      Multiple  0   Live Births  3           Past Medical History:  Diagnosis Date   Beta thalassemia trait    Family history of breast cancer in mother    Gonorrhea 12/2020   treated   Marijuana abuse    Seizures (HCC)    last seizure in 2021   Shingles outbreak 10/2018   Trichomonas infection 02/2021    Past Surgical History:  Procedure Laterality Date   NO PAST SURGERIES      Family History  Problem Relation Age of Onset   Cancer Mother 93       breast   Heart disease Father    Heart disease Paternal Grandmother    Heart disease Paternal Grandfather     Social History   Tobacco Use   Smoking status: Every Day    Current packs/day: 2.00    Types: Cigarettes    Passive exposure: Never   Smokeless tobacco: Never   Tobacco comments:    5=6 cigs a day  Vaping Use   Vaping status: Never Used  Substance Use Topics   Alcohol use: Not Currently   Drug use: Not Currently    Types: Marijuana    Comment: not since +UPT    Allergies: No Known Allergies  Medications Prior to  Admission  Medication Sig Dispense Refill Last Dose   Prenatal Vit-Fe Fumarate-FA (PRENATAL PLUS VITAMIN/MINERAL) 27-1 MG TABS Take 1 tablet by mouth daily. 30 tablet 11 12/04/2022 at 0700   acetaminophen (TYLENOL) 325 MG tablet Take 2 tablets (650 mg total) by mouth every 4 (four) hours as needed (for pain scale < 4). 30 tablet 0 12/02/2022   acetaminophen (TYLENOL) 500 MG tablet Take 500 mg by mouth every 6 (six) hours as needed.      cyclobenzaprine (FLEXERIL) 5 MG tablet Take 1 tablet (5 mg total) by mouth every 8 (eight) hours as needed for muscle spasms. 30 tablet 0 Unknown   famotidine (PEPCID) 40 MG tablet Take 1 tablet (40 mg total) by mouth daily. Take 1 tablet 1-2 times daily 60 tablet 0 More than a month   ferrous sulfate 325 (65 FE) MG EC tablet Take 1 tablet (325 mg total) by mouth every other day. 30 tablet 2 More than a  month    Review of Systems  Constitutional:  Negative for chills, fatigue and fever.  Eyes:  Negative for pain and visual disturbance.  Respiratory:  Negative for apnea, shortness of breath and wheezing.   Cardiovascular:  Negative for chest pain and palpitations.  Gastrointestinal:  Negative for abdominal pain, constipation, diarrhea, nausea and vomiting.  Genitourinary:  Positive for pelvic pain and vaginal discharge. Negative for difficulty urinating, dysuria, vaginal bleeding and vaginal pain.  Musculoskeletal:  Negative for back pain.  Neurological:  Negative for seizures, weakness and headaches.  Psychiatric/Behavioral:  Negative for suicidal ideas.    Physical Exam   Blood pressure 114/70, pulse 81, temperature 98.2 F (36.8 C), temperature source Oral, resp. rate 18, last menstrual period 02/25/2022, SpO2 100%, not currently breastfeeding.  Physical Exam Vitals and nursing note reviewed. Exam conducted with a chaperone present.  Constitutional:      General: She is not in acute distress.    Appearance: Normal appearance.  HENT:     Head:  Normocephalic.  Cardiovascular:     Rate and Rhythm: Normal rate and regular rhythm.  Pulmonary:     Effort: Pulmonary effort is normal.  Genitourinary:    Vagina: Vaginal discharge present.     Comments: Dilation: 3 Effacement (%): 40 Station: -3 Exam by:: Lamont Snowball, CNM  + whiff test. Copious amounts of green/yellow vaginal discharge. Negative for pooling. Neg fern x1 repeated. Wet prep and GC collected.  Musculoskeletal:     Cervical back: Normal range of motion.  Skin:    General: Skin is warm and dry.     Capillary Refill: Capillary refill takes 2 to 3 seconds.  Neurological:     Mental Status: She is alert and oriented to person, place, and time.  Psychiatric:        Mood and Affect: Mood normal.    FHT: 135bpm with moderate variability, accels present no decels noted. (Appropriate for gestational age)  Toco: occasional UC   MAU Course  Procedures Orders Placed This Encounter  Procedures   Wet prep, genital   Culture, OB Urine   Urinalysis, Routine w reflex microscopic -Urine, Clean Catch   Discharge patient   Results for orders placed or performed during the hospital encounter of 12/05/22 (from the past 24 hour(s))  Wet prep, genital     Status: Abnormal   Collection Time: 12/05/22 12:55 AM  Result Value Ref Range   Yeast Wet Prep HPF POC NONE SEEN NONE SEEN   Trich, Wet Prep PRESENT (A) NONE SEEN   Clue Cells Wet Prep HPF POC NONE SEEN NONE SEEN   WBC, Wet Prep HPF POC >=10 (A) <10   Sperm NONE SEEN   Urinalysis, Routine w reflex microscopic -Urine, Clean Catch     Status: Abnormal   Collection Time: 12/05/22  1:10 AM  Result Value Ref Range   Color, Urine YELLOW YELLOW   APPearance HAZY (A) CLEAR   Specific Gravity, Urine 1.026 1.005 - 1.030   pH 6.0 5.0 - 8.0   Glucose, UA NEGATIVE NEGATIVE mg/dL   Hgb urine dipstick NEGATIVE NEGATIVE   Bilirubin Urine NEGATIVE NEGATIVE   Ketones, ur NEGATIVE NEGATIVE mg/dL   Protein, ur 30 (A) NEGATIVE mg/dL    Nitrite NEGATIVE NEGATIVE   Leukocytes,Ua LARGE (A) NEGATIVE   RBC / HPF 21-50 0 - 5 RBC/hpf   WBC, UA >50 0 - 5 WBC/hpf   Bacteria, UA FEW (A) NONE SEEN   Squamous Epithelial / HPF 6-10  0 - 5 /HPF   Mucus PRESENT    Trichomonas, UA PRESENT (A) NONE SEEN    MDM - Neg Fern x2 low suspicion for SROM.  -  Wet prep and UA positive for Trich, UA also noted to have leuks, bacteria and protein. Reflexed to culture  - GC pending upon discharge  - Cervix unchanged following 1 hour  - plan for discharge.   Assessment and Plan   1. Vaginal discharge   2. [redacted] weeks gestation of pregnancy   3. Trichomoniasis   4. Intact amniotic membranes during pregnancy in third trimester    - Reviewed that STIs like Trich can cause abnormal vaginal discharge and abdominal discomfort.  - Rx for Metronidazole sent to outpatient pharmacy for pick up. Reviewed that patient needs to inform sexual partners, they also need treatment and to abstain from intercourse during treatment to avoid re-infection. Patient verbalized understanding.  - Reviewed signs and symptoms of labor.  - Reviewed worsening signs and return precautions.  - FHT appropriate for gestational age at time of discharge,  - Patient discharged home in stable condition and may return to MAU as needed.   Claudette Head, MSN CNM  12/05/2022, 2:54 AM

## 2022-12-05 NOTE — Telephone Encounter (Signed)
Called patient and left a message to call the office back to reschedule  her missed  appointment .Per family member she didn't leave the hospital til 5am .

## 2022-12-05 NOTE — MAU Note (Signed)
..  Alexis [name] is a 27 y.o. at [redacted]w[redacted]d here in MAU reporting: reports a gush of fluid around 2352 and continues to leak now. Reports contractions that began around midnight, has not been able to keep track of how often.  Denies vaginal bleeding.  Last movement was around midnight  Reports a lot of pelvic pressure and the urge to have a BM.   Pain score: 10/10 Vitals:   12/05/22 0245  BP: 114/70  Pulse: 81  Resp: 18  Temp: 98.2 F (36.8 C)  SpO2: 100%     FHT:144

## 2022-12-06 ENCOUNTER — Ambulatory Visit (INDEPENDENT_AMBULATORY_CARE_PROVIDER_SITE_OTHER): Payer: Medicaid Other | Admitting: Advanced Practice Midwife

## 2022-12-06 VITALS — BP 117/82 | HR 95 | Wt 174.8 lb

## 2022-12-06 DIAGNOSIS — A5901 Trichomonal vulvovaginitis: Secondary | ICD-10-CM

## 2022-12-06 DIAGNOSIS — Z3A35 35 weeks gestation of pregnancy: Secondary | ICD-10-CM

## 2022-12-06 DIAGNOSIS — Z3480 Encounter for supervision of other normal pregnancy, unspecified trimester: Secondary | ICD-10-CM

## 2022-12-06 DIAGNOSIS — O219 Vomiting of pregnancy, unspecified: Secondary | ICD-10-CM

## 2022-12-06 DIAGNOSIS — O23593 Infection of other part of genital tract in pregnancy, third trimester: Secondary | ICD-10-CM

## 2022-12-06 DIAGNOSIS — O99013 Anemia complicating pregnancy, third trimester: Secondary | ICD-10-CM

## 2022-12-06 LAB — CULTURE, OB URINE

## 2022-12-06 MED ORDER — ONDANSETRON 4 MG PO TBDP: 4.0000 mg | ORAL_TABLET | Freq: Four times a day (QID) | ORAL | 0 refills | Status: DC | PRN

## 2022-12-06 MED ORDER — METRONIDAZOLE 500 MG PO TABS
ORAL_TABLET | ORAL | 0 refills | Status: DC
Start: 2022-12-06 — End: 2022-12-06

## 2022-12-06 MED ORDER — METRONIDAZOLE 500 MG PO TABS: ORAL_TABLET | ORAL | 0 refills | Status: DC

## 2022-12-06 MED ORDER — ONDANSETRON 4 MG PO TBDP
4.0000 mg | ORAL_TABLET | Freq: Four times a day (QID) | ORAL | 0 refills | Status: DC | PRN
Start: 2022-12-06 — End: 2022-12-06
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
## 2022-12-07 LAB — CBC
Hematocrit: 28.6 % — ABNORMAL LOW (ref 34.0–46.6)
Hemoglobin: 9.1 g/dL — ABNORMAL LOW (ref 11.1–15.9)
MCH: 23.8 pg — ABNORMAL LOW (ref 26.6–33.0)
MCHC: 31.8 g/dL (ref 31.5–35.7)
MCV: 75 fL — ABNORMAL LOW (ref 79–97)
Platelets: 241 10*3/uL (ref 150–450)
RBC: 3.83 x10E6/uL (ref 3.77–5.28)
RDW: 15.4 % (ref 11.7–15.4)
WBC: 11.9 10*3/uL — ABNORMAL HIGH (ref 3.4–10.8)

## 2022-12-07 LAB — IRON,TIBC AND FERRITIN PANEL
Ferritin: 202 ng/mL — ABNORMAL HIGH (ref 15–150)
Iron Saturation: 37 % (ref 15–55)
Iron: 126 ug/dL (ref 27–159)
Total Iron Binding Capacity: 344 ug/dL (ref 250–450)
UIBC: 218 ug/dL (ref 131–425)

## 2022-12-08 NOTE — Progress Notes (Deleted)
Alexis [name] D.[name] [address] [place] [address] Phone: [phone]   Assessment and Plan:     There are no diagnoses linked to this encounter.  *** - Patient has received relief with OMT in the past.  Elects for repeat OMT today.  Tolerated well per note below. - Decision today to treat with OMT was based on Physical Exam   After verbal consent patient was treated with HVLA (high velocity low amplitude), ME (muscle energy), FPR (flex positional release), ST (soft tissue), PC/PD (Pelvic Compression/ Pelvic Decompression) techniques in cervical, rib, thoracic, lumbar, and pelvic areas. Patient tolerated the procedure well with improvement in symptoms.  Patient educated on potential side effects of soreness and recommended to rest, hydrate, and use Tylenol as needed for pain control.   Pertinent previous records reviewed include ***   Follow Up: ***     Subjective:   I, Alexis [name], am serving as a Neurosurgeon for Doctor Richardean Sale  Chief Complaint: right hip pain    HPI:    11/24/2022 Patient is a 27 year old female complaining of right hip pain. Patient states that she has been having right sided hip pain. Hard getting out of bed, walking. Pain for about a month and half. Tylenol does not touch the pain. No numbness and tingling in the hip . Pain radiates down the leg and to the low back and spine. Numbness and tingling in the hands   12/22/2022 Patient states   Relevant Historical Information: Current pregnancy, history of seizures, current smoker  Additional pertinent review of systems negative.  Current Outpatient Medications  Medication Sig Dispense Refill   acetaminophen (TYLENOL) 325 MG tablet Take 2 tablets (650 mg total) by mouth every 4 (four) hours as needed (for pain scale < 4). 30 tablet 0   acetaminophen (TYLENOL) 500 MG tablet Take 500 mg by mouth every 6 (six) hours as needed.     cyclobenzaprine (FLEXERIL) 5 MG  tablet Take 1 tablet (5 mg total) by mouth every 8 (eight) hours as needed for muscle spasms. (Patient not taking: Reported on 12/06/2022) 30 tablet 0   famotidine (PEPCID) 40 MG tablet Take 1 tablet (40 mg total) by mouth daily. Take 1 tablet 1-2 times daily (Patient not taking: Reported on 12/06/2022) 60 tablet 0   ferrous sulfate 325 (65 FE) MG EC tablet Take 1 tablet (325 mg total) by mouth every other day. (Patient not taking: Reported on 12/06/2022) 30 tablet 2   metroNIDAZOLE (FLAGYL) 500 MG tablet Take 1 tablet (500 mg total) by mouth 2 (two) times daily. 14 tablet 0   metroNIDAZOLE (FLAGYL) 500 MG tablet Take two tablets by mouth twice a day, for one day.  Or you can take all four tablets at once if you can tolerate it. 4 tablet 0   ondansetron (ZOFRAN-ODT) 4 MG disintegrating tablet Take 1-2 tablets (4-8 mg total) by mouth every 6 (six) hours as needed for nausea. 20 tablet 0   Prenatal Vit-Fe Fumarate-FA (PRENATAL PLUS VITAMIN/MINERAL) 27-1 MG TABS Take 1 tablet by mouth daily. 30 tablet 11   No current facility-administered medications for this visit.      Objective:     There were no vitals filed for this visit.    There is no height or weight on file to calculate BMI.    Physical Exam:     General: Well-appearing, cooperative, sitting comfortably in no acute distress.   OMT Physical Exam:  ASIS Compression Test: Positive Right Cervical: TTP paraspinal, *** Rib: Bilateral elevated first rib with TTP Thoracic: TTP paraspinal,*** Lumbar: TTP paraspinal,*** Pelvis: Right anterior innominate  Electronically signed by:  Alexis [name] D.[name] 8:58 AM 12/08/22

## 2022-12-09 ENCOUNTER — Telehealth: Payer: Self-pay

## 2022-12-09 ENCOUNTER — Other Ambulatory Visit (HOSPITAL_BASED_OUTPATIENT_CLINIC_OR_DEPARTMENT_OTHER): Payer: Self-pay | Admitting: Advanced Practice Midwife

## 2022-12-09 NOTE — Telephone Encounter (Signed)
Alexis [name], this patient will be scheduled as soon as possible.  Auth Submission: NO AUTH NEEDED Site of care: Site of care: [facility] Payer: [employer] [place] Medicaid Medication & CPT/J Code(s) submitted: Venofer (Iron Sucrose) J1756 Route of submission (phone, fax, portal):  Phone # Fax # Auth type: Buy/Bill PB Units/visits requested: 500mg  x 2 doses Reference number:  Approval from: 12/09/22 to 02/21/23

## 2022-12-09 NOTE — Progress Notes (Signed)
Pt Hgb remains low at 9.1 after previous Venofer infusions.  Iron stores wnl.  Will order an additional 2 doses of Venofer at 500 mg per dose to be given prior to delivery.

## 2022-12-12 ENCOUNTER — Telehealth (HOSPITAL_BASED_OUTPATIENT_CLINIC_OR_DEPARTMENT_OTHER): Payer: Self-pay | Admitting: *Deleted

## 2022-12-12 ENCOUNTER — Encounter (HOSPITAL_BASED_OUTPATIENT_CLINIC_OR_DEPARTMENT_OTHER): Payer: Self-pay

## 2022-12-12 ENCOUNTER — Encounter (HOSPITAL_BASED_OUTPATIENT_CLINIC_OR_DEPARTMENT_OTHER): Payer: Medicaid Other | Admitting: Certified Nurse Midwife

## 2022-12-12 NOTE — Telephone Encounter (Signed)
Called pt and was unable to leave a message

## 2022-12-13 ENCOUNTER — Encounter (HOSPITAL_COMMUNITY): Payer: Self-pay | Admitting: Obstetrics and Gynecology

## 2022-12-13 ENCOUNTER — Inpatient Hospital Stay (HOSPITAL_COMMUNITY): Payer: Medicaid Other | Admitting: Anesthesiology

## 2022-12-13 ENCOUNTER — Inpatient Hospital Stay (HOSPITAL_COMMUNITY)
Admission: AD | Admit: 2022-12-13 | Discharge: 2022-12-16 | DRG: 796 | Disposition: A | Discharge status: Home / Self Care | Payer: Medicaid Other | Attending: Obstetrics and Gynecology | Admitting: Obstetrics and Gynecology

## 2022-12-13 ENCOUNTER — Other Ambulatory Visit: Payer: Self-pay

## 2022-12-13 DIAGNOSIS — O9902 Anemia complicating childbirth: Secondary | ICD-10-CM | POA: Diagnosis present

## 2022-12-13 DIAGNOSIS — O99354 Diseases of the nervous system complicating childbirth: Secondary | ICD-10-CM | POA: Diagnosis present

## 2022-12-13 DIAGNOSIS — Z5982 Transportation insecurity: Secondary | ICD-10-CM | POA: Diagnosis not present

## 2022-12-13 DIAGNOSIS — Z148 Genetic carrier of other disease: Secondary | ICD-10-CM | POA: Diagnosis not present

## 2022-12-13 DIAGNOSIS — F17210 Nicotine dependence, cigarettes, uncomplicated: Secondary | ICD-10-CM | POA: Diagnosis present

## 2022-12-13 DIAGNOSIS — O42913 Preterm premature rupture of membranes, unspecified as to length of time between rupture and onset of labor, third trimester: Secondary | ICD-10-CM | POA: Diagnosis present

## 2022-12-13 DIAGNOSIS — Z302 Encounter for sterilization: Secondary | ICD-10-CM

## 2022-12-13 DIAGNOSIS — Z8249 Family history of ischemic heart disease and other diseases of the circulatory system: Secondary | ICD-10-CM | POA: Diagnosis not present

## 2022-12-13 DIAGNOSIS — Z803 Family history of malignant neoplasm of breast: Secondary | ICD-10-CM

## 2022-12-13 DIAGNOSIS — G40909 Epilepsy, unspecified, not intractable, without status epilepticus: Secondary | ICD-10-CM | POA: Diagnosis present

## 2022-12-13 DIAGNOSIS — R87612 Low grade squamous intraepithelial lesion on cytologic smear of cervix (LGSIL): Secondary | ICD-10-CM | POA: Diagnosis present

## 2022-12-13 DIAGNOSIS — O99334 Smoking (tobacco) complicating childbirth: Secondary | ICD-10-CM | POA: Diagnosis present

## 2022-12-13 DIAGNOSIS — Z3A36 36 weeks gestation of pregnancy: Secondary | ICD-10-CM | POA: Diagnosis not present

## 2022-12-13 DIAGNOSIS — O42013 Preterm premature rupture of membranes, onset of labor within 24 hours of rupture, third trimester: Secondary | ICD-10-CM | POA: Diagnosis not present

## 2022-12-13 DIAGNOSIS — Z9079 Acquired absence of other genital organ(s): Secondary | ICD-10-CM

## 2022-12-13 DIAGNOSIS — Z5986 Financial insecurity: Secondary | ICD-10-CM

## 2022-12-13 DIAGNOSIS — O9832 Other infections with a predominantly sexual mode of transmission complicating childbirth: Secondary | ICD-10-CM | POA: Diagnosis not present

## 2022-12-13 LAB — CBC
HCT: 29.8 % — ABNORMAL LOW (ref 36.0–46.0)
Hemoglobin: 9.6 g/dL — ABNORMAL LOW (ref 12.0–15.0)
MCH: 23.4 pg — ABNORMAL LOW (ref 26.0–34.0)
MCHC: 32.2 g/dL (ref 30.0–36.0)
MCV: 72.7 fL — ABNORMAL LOW (ref 80.0–100.0)
Platelets: 244 10*3/uL (ref 150–400)
RBC: 4.1 MIL/uL (ref 3.87–5.11)
RDW: 15.9 % — ABNORMAL HIGH (ref 11.5–15.5)
WBC: 11.6 10*3/uL — ABNORMAL HIGH (ref 4.0–10.5)
nRBC: 0 % (ref 0.0–0.2)

## 2022-12-13 LAB — TYPE AND SCREEN
ABO/RH(D): O POS
Antibody Screen: NEGATIVE

## 2022-12-13 MED ORDER — DIPHENHYDRAMINE HCL 50 MG/ML IJ SOLN: 12.5000 mg | INTRAMUSCULAR | Status: DC | PRN

## 2022-12-13 MED ORDER — OXYCODONE-ACETAMINOPHEN 5-325 MG PO TABS
1.0000 | ORAL_TABLET | ORAL | Status: DC | PRN
Start: 2022-12-13 — End: 2022-12-14

## 2022-12-13 MED ORDER — SODIUM CHLORIDE 0.9 % IV SOLN
5.0000 10*6.[IU] | Freq: Once | INTRAVENOUS | Status: AC
  Administered 2022-12-13: 5 10*6.[IU] via INTRAVENOUS
  Filled 2022-12-13: qty 5

## 2022-12-13 MED ORDER — LACTATED RINGERS IV SOLN
500.0000 mL | Freq: Once | INTRAVENOUS | Status: AC
  Administered 2022-12-13: 500 mL via INTRAVENOUS

## 2022-12-13 MED ORDER — LACTATED RINGERS IV SOLN: 500.0000 mL | INTRAVENOUS | Status: DC

## 2022-12-13 MED ORDER — FENTANYL CITRATE (PF) 100 MCG/2ML IJ SOLN
50.0000 ug | INTRAMUSCULAR | Status: DC | PRN
  Administered 2022-12-13: 100 ug via INTRAVENOUS
  Filled 2022-12-13: qty 2

## 2022-12-13 MED ORDER — SODIUM CHLORIDE 0.9% FLUSH: 3.0000 mL | Freq: Two times a day (BID) | INTRAVENOUS | Status: DC

## 2022-12-13 MED ORDER — OXYCODONE-ACETAMINOPHEN 5-325 MG PO TABS
2.0000 | ORAL_TABLET | ORAL | Status: DC | PRN
Start: 2022-12-13 — End: 2022-12-14

## 2022-12-13 MED ORDER — SODIUM CHLORIDE 0.9 % IV SOLN: 250.0000 mL | INTRAVENOUS | Status: DC | PRN

## 2022-12-13 MED ORDER — TERBUTALINE SULFATE 1 MG/ML IJ SOLN: 0.2500 mg | Freq: Once | INTRAMUSCULAR | Status: DC | PRN

## 2022-12-13 MED ORDER — PHENYLEPHRINE 80 MCG/ML (10ML) SYRINGE FOR IV PUSH (FOR BLOOD PRESSURE SUPPORT): 80.0000 ug | PREFILLED_SYRINGE | INTRAVENOUS | Status: DC | PRN

## 2022-12-13 MED ORDER — SODIUM CHLORIDE 0.9% FLUSH: 3.0000 mL | INTRAVENOUS | Status: DC | PRN

## 2022-12-13 MED ORDER — OXYTOCIN BOLUS FROM INFUSION
333.0000 mL | Freq: Once | INTRAVENOUS | Status: AC
  Administered 2022-12-14: 333 mL via INTRAVENOUS

## 2022-12-13 MED ORDER — EPHEDRINE 5 MG/ML INJ: 10.0000 mg | INTRAVENOUS | Status: DC | PRN

## 2022-12-13 MED ORDER — ACETAMINOPHEN 325 MG PO TABS
650.0000 mg | ORAL_TABLET | ORAL | Status: DC | PRN
Start: 2022-12-13 — End: 2022-12-14
  Filled 2022-12-13: qty 2

## 2022-12-13 MED ORDER — SOD CITRATE-CITRIC ACID 500-334 MG/5ML PO SOLN: 30.0000 mL | ORAL | Status: DC | PRN

## 2022-12-13 MED ORDER — LIDOCAINE HCL (PF) 1 % IJ SOLN: 30.0000 mL | INTRAMUSCULAR | Status: DC | PRN

## 2022-12-13 MED ORDER — PENICILLIN G POT IN DEXTROSE 60000 UNIT/ML IV SOLN: 3.0000 10*6.[IU] | INTRAVENOUS | Status: DC

## 2022-12-13 MED ORDER — LACTATED RINGERS IV SOLN: INTRAVENOUS | Status: DC

## 2022-12-13 MED ORDER — FENTANYL-BUPIVACAINE-NACL 0.5-0.125-0.9 MG/250ML-% EP SOLN
12.0000 mL/h | EPIDURAL | Status: DC | PRN
  Administered 2022-12-13: 12 mL/h via EPIDURAL
  Filled 2022-12-13: qty 250

## 2022-12-13 MED ORDER — HYDROXYZINE HCL 50 MG PO TABS
50.0000 mg | ORAL_TABLET | Freq: Four times a day (QID) | ORAL | Status: DC | PRN
Start: 2022-12-13 — End: 2022-12-14

## 2022-12-13 MED ORDER — OXYTOCIN-SODIUM CHLORIDE 30-0.9 UT/500ML-% IV SOLN
2.5000 [IU]/h | INTRAVENOUS | Status: DC
  Administered 2022-12-14: 2.5 [IU]/h via INTRAVENOUS

## 2022-12-13 MED ORDER — OXYTOCIN-SODIUM CHLORIDE 30-0.9 UT/500ML-% IV SOLN
1.0000 m[IU]/min | INTRAVENOUS | Status: DC
  Administered 2022-12-13: 2 m[IU]/min via INTRAVENOUS
  Filled 2022-12-13: qty 500

## 2022-12-13 NOTE — MAU Provider Note (Signed)
History     CSN: [id number]  Arrival date and time: 12/13/22 1654   Event Date/Time   First Provider Initiated Contact with Patient 12/13/22 1709      Chief Complaint  Patient presents with   Rupture of Membranes   HPI [name] is a 27 y.o. year old G85P3003 female at [redacted]w[redacted]d weeks gestation who presents to MAU via EMS reporting PPROM at 61 today. She reports she was laying in bed when she thought she was peeing on herself. She got up and went to the BR, peed, put on a pad, and the pad "filled up right away." She states "the fluid just keeps coming out." She denies any contractions or VB. She was treated for (+) trich on 10/14/204, has a h/o (+) GC/CT in this preg, but latest TOC was negative on 11/03/2022. She receives [facility] with [facility]; next appt is 12/14/2022.   *Patient reports upon arrival to MAU room #28, as she was getting in the bed from undressing she slipped on fluid, went down to her knees and hit her abdomen on the edge of the MAU bed.  OB History     Gravida  4   Para  3   Term  3   Preterm      AB      Living  3      SAB      IAB      Ectopic      Multiple  0   Live Births  3           Past Medical History:  Diagnosis Date   Beta thalassemia trait    Family history of breast cancer in mother    Gonorrhea 12/2020   treated   Marijuana abuse    Seizures (HCC)    last seizure in 2021   Shingles outbreak 10/2018   Trichomonas infection 02/2021    Past Surgical History:  Procedure Laterality Date   NO PAST SURGERIES      Family History  Problem Relation Age of Onset   Cancer Mother 46       breast   Heart disease Father    Heart disease Paternal Grandmother    Heart disease Paternal Grandfather     Social History   Tobacco Use   Smoking status: Every Day    Current packs/day: 2.00    Types: Cigarettes    Passive exposure: Never   Smokeless tobacco: Never   Tobacco comments:    5=6 cigs a day  Vaping Use    Vaping status: Never Used  Substance Use Topics   Alcohol use: Not Currently   Drug use: Not Currently    Types: Marijuana    Comment: not since +UPT    Allergies: No Known Allergies  Medications Prior to Admission  Medication Sig Dispense Refill Last Dose   Prenatal Vit-Fe Fumarate-FA (PRENATAL PLUS VITAMIN/MINERAL) 27-1 MG TABS Take 1 tablet by mouth daily. 30 tablet 11 12/13/2022   acetaminophen (TYLENOL) 325 MG tablet Take 2 tablets (650 mg total) by mouth every 4 (four) hours as needed (for pain scale < 4). 30 tablet 0    acetaminophen (TYLENOL) 500 MG tablet Take 500 mg by mouth every 6 (six) hours as needed.      cyclobenzaprine (FLEXERIL) 5 MG tablet Take 1 tablet (5 mg total) by mouth every 8 (eight) hours as needed for muscle spasms. (Patient not taking: Reported on 12/06/2022) 30 tablet 0  famotidine (PEPCID) 40 MG tablet Take 1 tablet (40 mg total) by mouth daily. Take 1 tablet 1-2 times daily (Patient not taking: Reported on 12/06/2022) 60 tablet 0    ferrous sulfate 325 (65 FE) MG EC tablet Take 1 tablet (325 mg total) by mouth every other day. (Patient not taking: Reported on 12/06/2022) 30 tablet 2    metroNIDAZOLE (FLAGYL) 500 MG tablet Take 1 tablet (500 mg total) by mouth 2 (two) times daily. 14 tablet 0    metroNIDAZOLE (FLAGYL) 500 MG tablet Take two tablets by mouth twice a day, for one day.  Or you can take all four tablets at once if you can tolerate it. 4 tablet 0    ondansetron (ZOFRAN-ODT) 4 MG disintegrating tablet Take 1-2 tablets (4-8 mg total) by mouth every 6 (six) hours as needed for nausea. 20 tablet 0     Review of Systems  Constitutional: Negative.   HENT: Negative.    Eyes: Negative.   Respiratory: Negative.    Cardiovascular: Negative.   Gastrointestinal: Negative.   Endocrine: Negative.   Genitourinary:  Positive for pelvic pain (from where abd hit side of bed in MAU) and vaginal discharge.  Musculoskeletal: Negative.   Skin: Negative.    Allergic/Immunologic: Negative.   Neurological: Negative.   Hematological: Negative.   Psychiatric/Behavioral: Negative.     Physical Exam   Blood pressure 125/81, pulse 96, temperature 98.5 F (36.9 C), temperature source Oral, resp. rate 17, height 5\' 4"  (1.626 m), weight 78.5 kg, last menstrual period 02/25/2022, SpO2 100%, not currently breastfeeding.  Physical Exam Constitutional:      Appearance: Normal appearance. She is normal weight.  Cardiovascular:     Rate and Rhythm: Normal rate.  Pulmonary:     Effort: Pulmonary effort is normal.  Abdominal:     Palpations: Abdomen is soft.     Tenderness: There is abdominal tenderness (mild tenderness with palpation in lower RT pelvic area, no bruising or broken skin noted).  Genitourinary:    General: Normal vulva.     Comments: Perineum wet and no bleeding observed Musculoskeletal:        General: Normal range of motion.  Skin:    General: Skin is warm and dry.  Neurological:     Mental Status: She is alert and oriented to person, place, and time.  Psychiatric:        Mood and Affect: Mood normal.        Behavior: Behavior normal.        Thought Content: Thought content normal.        Judgment: Judgment normal.    REACTIVE NST - FHR: 135 bpm / moderate variability / accels present / decels absent / TOCO: irregular UCs   MAU Course  Procedures Patient informed that the ultrasound is considered a limited OB ultrasound and is not intended to be a complete ultrasound exam.  Patient also informed that the ultrasound is not being completed with the intent of assessing for fetal or placental anomalies or any pelvic abnormalities.  Explained that the purpose of today's ultrasound is to assess for presentation.  Baby was found to be in a cephalic presentation. Patient acknowledges the purpose of the exam and the limitations of the study.  MDM Informal BS U/S Fern Slide GBS  Assessment and Plan  27 y.o. [id number] [redacted]w[redacted]d   PPROM - Admit to L&D  - Routine admission orders - Lamont Snowball, CNM notified of admission and accident that occurred and no  apparent injuries noted on exam in MAU - Huntley Dec, CNM will assume care of patient upon admission.  Raelyn Mora, CNM 12/13/2022, 5:09 PM

## 2022-12-13 NOTE — Anesthesia Preprocedure Evaluation (Signed)
Anesthesia Evaluation  Patient identified by MRN, date of birth, ID band Patient awake    Reviewed: Allergy & Precautions, Patient's Chart, lab work & pertinent test results  Airway Mallampati: II       Dental no notable dental hx.    Pulmonary Current Smoker   Pulmonary exam normal        Cardiovascular negative cardio ROS Normal cardiovascular exam Rhythm:Regular     Neuro/Psych Seizures -, Well Controlled,  PSYCHIATRIC DISORDERS  Depression       GI/Hepatic negative GI ROS, Neg liver ROS,,,  Endo/Other  negative endocrine ROS    Renal/GU negative Renal ROS  negative genitourinary   Musculoskeletal negative musculoskeletal ROS (+)    Abdominal   Peds  Hematology  (+) Blood dyscrasia, anemia   Anesthesia Other Findings   Reproductive/Obstetrics (+) Pregnancy 36 3/7 weeks                             Anesthesia Physical Anesthesia Plan  ASA: 2  Anesthesia Plan: Epidural   Post-op Pain Management:    Induction: Intravenous  PONV Risk Score and Plan:   Airway Management Planned: Natural Airway  Additional Equipment:   Intra-op Plan:   Post-operative Plan:   Informed Consent: I have reviewed the patients History and Physical, chart, labs and discussed the procedure including the risks, benefits and alternatives for the proposed anesthesia with the patient or authorized representative who has indicated his/her understanding and acceptance.       Plan Discussed with: Anesthesiologist  Anesthesia Plan Comments:        Anesthesia Quick Evaluation

## 2022-12-13 NOTE — Progress Notes (Signed)
Labor Progress Note Alexis [name] is a 27 y.o. [id number] at [redacted]w[redacted]d presented for PPROM.  S: Pt denies any concerns at this time.  O:  BP 119/85   Pulse 92   Temp 98 F (36.7 C) (Oral)   Resp 18   Ht 5\' 4"  (1.626 m)   Wt 78.5 kg   LMP 02/25/2022 (Approximate)   SpO2 100%   BMI 29.70 kg/m  EFM: 135/min/no a/occ variables  CVE: Dilation: 4 Effacement (%): 50 Station: -3 Presentation: Vertex Exam by:: Dr. Judd Lien   A&P: 27 y.o. [id number] [redacted]w[redacted]d here for PPROM.  #Labor: Contracting regularly  cont Pitocin #Pain: Per pt request #FWB: Cat II #GBS unknown -- on PCN ppx given GA  #H/o trichomoniasis: pos on 10/14, s/p abx  #H/o GC/CT: s/p TOC  #Abdominal trauma: slipped earlier today, watching EFM closely  #H/o seizure: last ~4 yrs ago  #Anemia: HgB 9.6 today  Sundra Aland, MD 9:34 PM

## 2022-12-13 NOTE — Plan of Care (Signed)
  Problem: Education: Goal: Knowledge of General Education information will improve Description: Including pain rating scale, medication(s)/side effects and non-pharmacologic comfort measures Outcome: Progressing   Problem: Health Behavior/Discharge Planning: Goal: Ability to manage health-related needs will improve Outcome: Progressing   Problem: Clinical Measurements: Goal: Ability to maintain clinical measurements within normal limits will improve Outcome: Progressing Goal: Will remain free from infection Outcome: Progressing Goal: Diagnostic test results will improve Outcome: Progressing Goal: Respiratory complications will improve Outcome: Progressing Goal: Cardiovascular complication will be avoided Outcome: Progressing   Problem: Activity: Goal: Risk for activity intolerance will decrease Outcome: Progressing   Problem: Nutrition: Goal: Adequate nutrition will be maintained Outcome: Progressing   Problem: Coping: Goal: Level of anxiety will decrease Outcome: Progressing   Problem: Elimination: Goal: Will not experience complications related to bowel motility Outcome: Progressing Goal: Will not experience complications related to urinary retention Outcome: Progressing   Problem: Pain Management: Goal: General experience of comfort will improve Outcome: Progressing   Problem: Safety: Goal: Ability to remain free from injury will improve Outcome: Progressing   Problem: Skin Integrity: Goal: Risk for impaired skin integrity will decrease Outcome: Progressing   Problem: Education: Goal: Knowledge of Childbirth will improve Outcome: Progressing Goal: Ability to make informed decisions regarding treatment and plan of care will improve Outcome: Progressing Goal: Ability to state and carry out methods to decrease the pain will improve Outcome: Progressing Goal: Individualized Educational Video(s) Outcome: Progressing   Problem: Coping: Goal: Ability to  verbalize concerns and feelings about labor and delivery will improve Outcome: Progressing   Problem: Life Cycle: Goal: Ability to make normal progression through stages of labor will improve Outcome: Progressing Goal: Ability to effectively push during vaginal delivery will improve Outcome: Progressing   Problem: Role Relationship: Goal: Will demonstrate positive interactions with the child Outcome: Progressing   Problem: Safety: Goal: Risk of complications during labor and delivery will decrease Outcome: Progressing   Problem: Pain Management: Goal: Relief or control of pain from uterine contractions will improve Outcome: Progressing
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
## 2022-12-13 NOTE — Telephone Encounter (Signed)
Called pt in response to [employer]. Pt reports feeling leaking fluid when she was in bed. She went to the bathroom and then to the kitchen. After being in the kitchen she continued to have fluid leaking. Advised pt to go [facility] for evaluation. Pt reports that she has called EMT and is awaiting their arrival for transportation.

## 2022-12-13 NOTE — MAU Note (Signed)
.  Alexis [name] is a 27 y.o. at [redacted]w[redacted]d here in MAU reporting: ROM at 1600, denies contractions. Reports positive fetal movement. Arrived via EMS at 1650, ambulatory to unit  to room 128. RN left pt to step into hallway to get report from EMS while pt undressed. Pt called out and RN stepped into the room. Pt was on her knees on the floor and states she slipped in the fluid when she was trying to get into the bed and went to her knees. Thinks she hit her abd on the bed as she went down. No obvious injury. Pt into the bed at this time and FHT's obtained. Provider notified of fall and at bedside at 1703 Onset of complaint: 1600 Pain score: 0/10 There were no vitals filed for this visit.   FHT:136 Lab orders placed from triage:

## 2022-12-14 ENCOUNTER — Encounter (HOSPITAL_COMMUNITY)
Admission: AD | Disposition: A | Discharge status: Home / Self Care | Payer: Self-pay | Source: Home / Self Care | Attending: Obstetrics and Gynecology

## 2022-12-14 ENCOUNTER — Inpatient Hospital Stay (HOSPITAL_COMMUNITY): Payer: Medicaid Other | Admitting: Anesthesiology

## 2022-12-14 ENCOUNTER — Encounter (HOSPITAL_COMMUNITY): Payer: Self-pay | Admitting: Obstetrics and Gynecology

## 2022-12-14 ENCOUNTER — Encounter (HOSPITAL_BASED_OUTPATIENT_CLINIC_OR_DEPARTMENT_OTHER): Payer: Medicaid Other | Admitting: Certified Nurse Midwife

## 2022-12-14 DIAGNOSIS — Z302 Encounter for sterilization: Secondary | ICD-10-CM

## 2022-12-14 DIAGNOSIS — O9832 Other infections with a predominantly sexual mode of transmission complicating childbirth: Secondary | ICD-10-CM

## 2022-12-14 DIAGNOSIS — Z9079 Acquired absence of other genital organ(s): Secondary | ICD-10-CM

## 2022-12-14 DIAGNOSIS — O42013 Preterm premature rupture of membranes, onset of labor within 24 hours of rupture, third trimester: Secondary | ICD-10-CM

## 2022-12-14 DIAGNOSIS — Z3A36 36 weeks gestation of pregnancy: Secondary | ICD-10-CM

## 2022-12-14 HISTORY — PX: TUBAL LIGATION: SHX77

## 2022-12-14 LAB — RPR: RPR Ser Ql: NONREACTIVE

## 2022-12-14 SURGERY — LIGATION, FALLOPIAN TUBE, POSTPARTUM
Anesthesia: Spinal | Site: Abdomen

## 2022-12-14 MED ORDER — OXYCODONE HCL 5 MG/5ML PO SOLN: 5.0000 mg | Freq: Once | ORAL | Status: DC | PRN

## 2022-12-14 MED ORDER — LACTATED RINGERS IV SOLN: INTRAVENOUS | Status: DC

## 2022-12-14 MED ORDER — SODIUM CHLORIDE 0.9 % IR SOLN
Status: DC | PRN
  Administered 2022-12-14: 1000 mL

## 2022-12-14 MED ORDER — BUPIVACAINE IN DEXTROSE 0.75-8.25 % IT SOLN
INTRATHECAL | Status: DC | PRN
  Administered 2022-12-14: 1.4 mL via INTRATHECAL

## 2022-12-14 MED ORDER — NALOXONE HCL 0.4 MG/ML IJ SOLN: 0.4000 mg | INTRAMUSCULAR | Status: DC | PRN

## 2022-12-14 MED ORDER — ONDANSETRON HCL 4 MG/2ML IJ SOLN: 4.0000 mg | Freq: Three times a day (TID) | INTRAMUSCULAR | Status: DC | PRN

## 2022-12-14 MED ORDER — SODIUM CHLORIDE 0.9% FLUSH: 3.0000 mL | INTRAVENOUS | Status: DC | PRN

## 2022-12-14 MED ORDER — SODIUM CHLORIDE 0.9 % IV SOLN: 12.5000 mg | INTRAVENOUS | Status: DC | PRN

## 2022-12-14 MED ORDER — ACETAMINOPHEN 500 MG PO TABS: 1000.0000 mg | ORAL_TABLET | Freq: Once | ORAL | Status: AC

## 2022-12-14 MED ORDER — FENTANYL CITRATE (PF) 100 MCG/2ML IJ SOLN: 25.0000 ug | INTRAMUSCULAR | Status: DC | PRN

## 2022-12-14 MED ORDER — IBUPROFEN 800 MG PO TABS
800.0000 mg | ORAL_TABLET | Freq: Three times a day (TID) | ORAL | Status: DC
  Administered 2022-12-14 – 2022-12-16 (×8): 800 mg via ORAL
  Filled 2022-12-14 (×8): qty 1

## 2022-12-14 MED ORDER — FENTANYL CITRATE (PF) 100 MCG/2ML IJ SOLN
INTRAMUSCULAR | Status: DC | PRN
  Administered 2022-12-14: 85 ug via INTRAVENOUS

## 2022-12-14 MED ORDER — AMISULPRIDE (ANTIEMETIC) 5 MG/2ML IV SOLN: 10.0000 mg | Freq: Once | INTRAVENOUS | Status: DC | PRN

## 2022-12-14 MED ORDER — SCOPOLAMINE 1 MG/3DAYS TD PT72
1.0000 | MEDICATED_PATCH | Freq: Once | TRANSDERMAL | Status: DC
  Administered 2022-12-14: 1.5 mg via TRANSDERMAL
  Filled 2022-12-14: qty 1

## 2022-12-14 MED ORDER — PRENATAL MULTIVITAMIN CH
1.0000 | ORAL_TABLET | Freq: Every day | ORAL | Status: DC
  Administered 2022-12-14 – 2022-12-16 (×3): 1 via ORAL
  Filled 2022-12-14 (×3): qty 1

## 2022-12-14 MED ORDER — FENTANYL CITRATE (PF) 100 MCG/2ML IJ SOLN
INTRAMUSCULAR | Status: AC
  Filled 2022-12-14: qty 2

## 2022-12-14 MED ORDER — FENTANYL CITRATE (PF) 100 MCG/2ML IJ SOLN
INTRAMUSCULAR | Status: DC | PRN
  Administered 2022-12-14: 15 ug via INTRATHECAL

## 2022-12-14 MED ORDER — KETOROLAC TROMETHAMINE 30 MG/ML IJ SOLN: 30.0000 mg | Freq: Four times a day (QID) | INTRAMUSCULAR | Status: DC | PRN

## 2022-12-14 MED ORDER — TRANEXAMIC ACID-NACL 1000-0.7 MG/100ML-% IV SOLN
INTRAVENOUS | Status: AC
  Administered 2022-12-14: 1000 mg
  Filled 2022-12-14: qty 100

## 2022-12-14 MED ORDER — SODIUM CHLORIDE 0.9% FLUSH
3.0000 mL | Freq: Two times a day (BID) | INTRAVENOUS | Status: DC
  Administered 2022-12-15: 3 mL via INTRAVENOUS

## 2022-12-14 MED ORDER — WITCH HAZEL-GLYCERIN EX PADS: 1.0000 | MEDICATED_PAD | CUTANEOUS | Status: DC | PRN

## 2022-12-14 MED ORDER — LIDOCAINE HCL (PF) 1 % IJ SOLN
INTRAMUSCULAR | Status: DC | PRN
  Administered 2022-12-13 (×2): 5 mL via EPIDURAL

## 2022-12-14 MED ORDER — ACETAMINOPHEN 325 MG PO TABS
650.0000 mg | ORAL_TABLET | ORAL | Status: DC | PRN
  Administered 2022-12-14 – 2022-12-15 (×5): 650 mg via ORAL
  Filled 2022-12-14 (×5): qty 2

## 2022-12-14 MED ORDER — COCONUT OIL OIL: 1.0000 | TOPICAL_OIL | Status: DC | PRN

## 2022-12-14 MED ORDER — ONDANSETRON HCL 4 MG/2ML IJ SOLN: 4.0000 mg | INTRAMUSCULAR | Status: DC | PRN

## 2022-12-14 MED ORDER — NALOXONE HCL 4 MG/10ML IJ SOLN: 1.0000 ug/kg/h | INTRAVENOUS | Status: DC | PRN

## 2022-12-14 MED ORDER — DIPHENHYDRAMINE HCL 50 MG/ML IJ SOLN: 12.5000 mg | INTRAMUSCULAR | Status: DC | PRN

## 2022-12-14 MED ORDER — SODIUM CHLORIDE 0.9% FLUSH: 10.0000 mL | Freq: Two times a day (BID) | INTRAVENOUS | Status: DC

## 2022-12-14 MED ORDER — SOD CITRATE-CITRIC ACID 500-334 MG/5ML PO SOLN
30.0000 mL | Freq: Once | ORAL | Status: AC
  Administered 2022-12-14: 30 mL via ORAL
  Filled 2022-12-14: qty 30

## 2022-12-14 MED ORDER — ORAL CARE MOUTH RINSE
15.0000 mL | Freq: Once | OROMUCOSAL | Status: AC
  Administered 2022-12-14: 15 mL via OROMUCOSAL

## 2022-12-14 MED ORDER — ACETAMINOPHEN 160 MG/5ML PO SOLN
960.0000 mg | Freq: Once | ORAL | Status: AC
  Administered 2022-12-14: 960 mg via ORAL
  Filled 2022-12-14: qty 40.6

## 2022-12-14 MED ORDER — KETOROLAC TROMETHAMINE 30 MG/ML IJ SOLN
30.0000 mg | Freq: Four times a day (QID) | INTRAMUSCULAR | Status: DC | PRN
Start: 2022-12-14 — End: 2022-12-15

## 2022-12-14 MED ORDER — MIDAZOLAM HCL 2 MG/2ML IJ SOLN
INTRAMUSCULAR | Status: AC
  Filled 2022-12-14: qty 2

## 2022-12-14 MED ORDER — DIPHENHYDRAMINE HCL 25 MG PO CAPS: 25.0000 mg | ORAL_CAPSULE | Freq: Four times a day (QID) | ORAL | Status: DC | PRN

## 2022-12-14 MED ORDER — FAMOTIDINE 20 MG PO TABS
20.0000 mg | ORAL_TABLET | Freq: Once | ORAL | Status: AC
  Administered 2022-12-14: 20 mg via ORAL
  Filled 2022-12-14: qty 1

## 2022-12-14 MED ORDER — DIPHENHYDRAMINE HCL 25 MG PO CAPS: 25.0000 mg | ORAL_CAPSULE | ORAL | Status: DC | PRN

## 2022-12-14 MED ORDER — SIMETHICONE 80 MG PO CHEW: 80.0000 mg | CHEWABLE_TABLET | ORAL | Status: DC | PRN

## 2022-12-14 MED ORDER — ONDANSETRON HCL 4 MG/2ML IJ SOLN: 4.0000 mg | Freq: Four times a day (QID) | INTRAMUSCULAR | Status: DC | PRN

## 2022-12-14 MED ORDER — CHLORHEXIDINE GLUCONATE 0.12 % MT SOLN
15.0000 mL | Freq: Once | OROMUCOSAL | Status: AC
  Filled 2022-12-14: qty 15

## 2022-12-14 MED ORDER — DIBUCAINE (PERIANAL) 1 % EX OINT
1.0000 | TOPICAL_OINTMENT | CUTANEOUS | Status: DC | PRN
  Administered 2022-12-15: 1 via RECTAL
  Filled 2022-12-14: qty 28

## 2022-12-14 MED ORDER — OXYCODONE HCL 5 MG PO TABS: 5.0000 mg | ORAL_TABLET | Freq: Once | ORAL | Status: DC | PRN

## 2022-12-14 MED ORDER — SENNOSIDES-DOCUSATE SODIUM 8.6-50 MG PO TABS
2.0000 | ORAL_TABLET | ORAL | Status: DC
  Administered 2022-12-15 – 2022-12-16 (×2): 2 via ORAL
  Filled 2022-12-14 (×2): qty 2

## 2022-12-14 MED ORDER — BENZOCAINE-MENTHOL 20-0.5 % EX AERO
1.0000 | INHALATION_SPRAY | CUTANEOUS | Status: DC | PRN
  Administered 2022-12-15: 1 via TOPICAL
  Filled 2022-12-14: qty 56

## 2022-12-14 MED ORDER — ONDANSETRON HCL 4 MG PO TABS: 4.0000 mg | ORAL_TABLET | ORAL | Status: DC | PRN

## 2022-12-14 MED ORDER — MIDAZOLAM HCL 2 MG/2ML IJ SOLN
INTRAMUSCULAR | Status: DC | PRN
  Administered 2022-12-14: 2 mg via INTRAVENOUS

## 2022-12-14 MED ORDER — STERILE WATER FOR IRRIGATION IR SOLN
Status: DC | PRN
  Administered 2022-12-14: 1000 mL

## 2022-12-14 MED ORDER — SCOPOLAMINE 1 MG/3DAYS TD PT72: 1.0000 | MEDICATED_PATCH | Freq: Once | TRANSDERMAL | Status: DC

## 2022-12-14 MED ORDER — MEPERIDINE HCL 25 MG/ML IJ SOLN
6.2500 mg | INTRAMUSCULAR | Status: DC | PRN
Start: 2022-12-14 — End: 2022-12-14

## 2022-12-14 SURGICAL SUPPLY — 20 items
BLADE SURG 11 STRL SS (BLADE) ×1 IMPLANT
CLIP FILSHIE TUBAL LIGA STRL (Clip) ×1 IMPLANT
DRSG OPSITE POSTOP 3X4 (GAUZE/BANDAGES/DRESSINGS) ×1 IMPLANT
DURAPREP 26ML APPLICATOR (WOUND CARE) ×1 IMPLANT
GLOVE BIOGEL PI IND STRL 7.0 (GLOVE) ×1 IMPLANT
GLOVE BIOGEL PI IND STRL 7.5 (GLOVE) ×1 IMPLANT
GLOVE ECLIPSE 7.5 STRL STRAW (GLOVE) ×1 IMPLANT
GOWN STRL REUS W/TWL LRG LVL3 (GOWN DISPOSABLE) ×2 IMPLANT
HIBICLENS CHG 4% 4OZ BTL (MISCELLANEOUS) ×1 IMPLANT
LIGASURE IMPACT 36 18CM CVD LR (INSTRUMENTS) IMPLANT
NEEDLE HYPO 22GX1.5 SAFETY (NEEDLE) ×1 IMPLANT
NS IRRIG 1000ML POUR BTL (IV SOLUTION) ×1 IMPLANT
PACK ABDOMINAL MINOR (CUSTOM PROCEDURE TRAY) ×1 IMPLANT
PROTECTOR NERVE ULNAR (MISCELLANEOUS) ×1 IMPLANT
SPONGE LAP 4X18 RFD (DISPOSABLE) IMPLANT
SUT VICRYL 0 UR6 27IN ABS (SUTURE) ×1 IMPLANT
SUT VICRYL 4-0 PS2 18IN ABS (SUTURE) ×1 IMPLANT
SYR CONTROL 10ML LL (SYRINGE) ×1 IMPLANT
TOWEL OR 17X24 6PK STRL BLUE (TOWEL DISPOSABLE) ×2 IMPLANT
TRAY FOLEY W/BAG SLVR 14FR (SET/KITS/TRAYS/PACK) ×1 IMPLANT

## 2022-12-14 NOTE — Anesthesia Procedure Notes (Addendum)
Epidural Patient location during procedure: OB Start time: 12/13/2022 11:49 PM End time: 12/13/2022 11:57 PM  Staffing Anesthesiologist: Mal Amabile, MD Performed: anesthesiologist   Preanesthetic Checklist Completed: patient identified, IV checked, site marked, risks and benefits discussed, surgical consent, monitors and equipment checked, pre-op evaluation and timeout performed  Epidural Patient position: sitting Prep: DuraPrep and site prepped and draped Patient monitoring: continuous pulse ox and blood pressure Approach: midline Location: L3-L4 Injection technique: LOR air  Needle:  Needle type: Tuohy  Needle gauge: 17 G Needle length: 9 cm and 9 Needle insertion depth: 6 cm Catheter type: closed end flexible Catheter size: 19 Gauge Catheter at skin depth: 11 cm Test dose: negative and Other  Assessment Events: blood not aspirated, no cerebrospinal fluid, injection not painful, no injection resistance, no paresthesia and negative IV test  Additional Notes Patient identified. Risks and benefits discussed including failed block, incomplete  Pain control, post dural puncture headache, nerve damage, paralysis, blood pressure Changes, nausea, vomiting, reactions to medications-both toxic and allergic and post Partum back pain. All questions were answered. Patient expressed understanding and wished to proceed. Sterile technique was used throughout procedure. Epidural site was Dressed with sterile barrier dressing. No paresthesias, signs of intravascular injection Or signs of intrathecal spread were encountered.  Patient was more comfortable after the epidural was dosed. Please see RN's note for documentation of vital signs and FHR which are stable. Reason for block:procedure for pain

## 2022-12-14 NOTE — Anesthesia Procedure Notes (Signed)
Spinal  Patient location during procedure: OR Start time: 12/14/2022 9:36 AM End time: 12/14/2022 9:39 AM Reason for block: surgical anesthesia Staffing Performed: anesthesiologist  Anesthesiologist: Beryle Lathe, MD Performed by: Beryle Lathe, MD Authorized by: Beryle Lathe, MD   Preanesthetic Checklist Completed: patient identified, IV checked, risks and benefits discussed, surgical consent, monitors and equipment checked, pre-op evaluation and timeout performed Spinal Block Patient position: sitting Prep: DuraPrep Patient monitoring: heart rate, cardiac monitor, continuous pulse ox and blood pressure Approach: midline Location: L2-3 Injection technique: single-shot Needle Needle type: Pencan  Needle gauge: 24 G Additional Notes Consent was obtained prior to the procedure with all questions answered and concerns addressed. Risks including, but not limited to, bleeding, infection, nerve damage, paralysis, failed block, inadequate analgesia, allergic reaction, high spinal, itching, and headache were discussed and the patient wished to proceed. Functioning IV was confirmed and monitors were applied. Sterile prep and drape, including hand hygiene, mask, and sterile gloves were used. The patient was positioned and the spine was prepped. The skin was anesthetized with lidocaine. Free flow of clear CSF was obtained prior to injecting local anesthetic into the CSF. The spinal needle aspirated freely following injection. The needle was carefully withdrawn. The patient tolerated the procedure well.   Leslye Peer, MD

## 2022-12-14 NOTE — Anesthesia Postprocedure Evaluation (Signed)
Anesthesia Post Note  Patient: Alexis [name]  Procedure(s) Performed: AN AD HOC LABOR EPIDURAL     Patient location during evaluation: Mother Baby Anesthesia Type: Epidural Level of consciousness: awake and alert Pain management: pain level controlled Vital Signs Assessment: post-procedure vital signs reviewed and stable Respiratory status: spontaneous breathing, nonlabored ventilation and respiratory function stable Cardiovascular status: stable Postop Assessment: no headache, no backache and epidural receding Anesthetic complications: no   No notable events documented.  Last Vitals:  Vitals:   12/14/22 0240 12/14/22 0357  BP: 122/77 121/79  Pulse: 65 70  Resp: 16 16  Temp: 36.8 C 36.8 C  SpO2: 100% 100%    Last Pain:  Vitals:   12/14/22 0357  TempSrc: Oral  PainSc:    Pain Goal:                   [employer]

## 2022-12-14 NOTE — Op Note (Addendum)
Alexis [name] 12/14/2022  PREOPERATIVE DIAGNOSIS:  Undesired fertility  POSTOPERATIVE DIAGNOSIS:  Undesired fertility  PROCEDURE:  Postpartum Bilateral Tubal Sterilization using Ligasure   SURGEON:  Dr Merian Capron  ANESTHESIA:  Epidural  COMPLICATIONS:  None immediate.  ESTIMATED BLOOD LOSS:  Less than 20cc.  FLUIDS: 600 mL LR.  URINE OUTPUT:  20 mL of clear urine.  INDICATIONS: 27 y.o. yo [id number]  with undesired fertility,status post vaginal delivery, desires permanent sterilization. Risks and benefits of procedure discussed with patient including permanence of method, bleeding, infection, injury to surrounding organs and need for additional procedures. Risk failure of 0.5-1% with increased risk of ectopic gestation if pregnancy occurs was also discussed with patient.   FINDINGS:  Normal uterus, tubes, and ovaries.  TECHNIQUE:  The patient was taken to the operating room where her epidural anesthesia was dosed up to surgical level and found to be adequate.  She was then placed in the dorsal supine position and prepped and draped in sterile fashion.  After an adequate timeout was performed, attention was turned to the patient's abdomen where a small transverse skin incision was made under the umbilical fold. The incision was taken down to the layer of fascia using the scalpel, and fascia was incised, and extended bilaterally using Mayo scissors. The peritoneum was entered in a sharp fashion.   Attention was then turned to the patient's adnexa. The left fallopian tube was identified and followed out to the fimbriated end.  Ligasure device was used to cauterize and cut the mesosalpinx to proximal end of the fallopian tube, removing 6cm of tube. A similar process was carried out on the right side allowing for bilateral tubal sterilization.    Good hemostasis was noted overall.The instruments were then removed from the patient's abdomen and the fascial incision was repaired with 0  Vicryl, and the skin was closed with a 3-0 Vicryl subcuticular stitch. The patient tolerated the procedure well.  Sponge, lap, and needle counts were correct times two.  The patient was then taken to the recovery room awake, extubated and in stable condition.   Celedonio Savage, MD 12/14/2022 10:34 AM

## 2022-12-14 NOTE — Transfer of Care (Signed)
Immediate Anesthesia Transfer of Care Note  Patient: Alexis [name]  Procedure(s) Performed: POST PARTUM TUBAL LIGATION (Abdomen)  Patient Location: PACU  Anesthesia Type:Spinal  Level of Consciousness: awake, alert , oriented, and patient cooperative  Airway & Oxygen Therapy: Patient Spontanous Breathing  Post-op Assessment: Report given to RN and Post -op Vital signs reviewed and stable  Post vital signs: Reviewed and stable  Last Vitals:  Vitals Value Taken Time  BP 104/59 12/14/22 1638  Temp 36.9 C 12/14/22 1205  Pulse 60 12/14/22 1638  Resp 17 12/14/22 1638  SpO2 100 % 12/14/22 1638    Last Pain:  Vitals:   12/14/22 2030  TempSrc:   PainSc: 8          Complications: No notable events documented.

## 2022-12-14 NOTE — Lactation Note (Signed)
This note was copied from a baby's chart. Lactation Consultation Note  Patient Name: Alexis [name] [id number] Date: 12/14/2022 Age:29 hours Reason for consult: Initial assessment;Late-preterm 34-36.6wks.  RN was supplementing infant with formula while LC was in the room, Per MOB infant is latching well and BF for 15 minutes in L&D. MOB will continue to follow LPTI feeding policy and limit BF to 15 minutes or less and then supplement infant with any EBM first and then formula offering (12-14 mls) per feeding on Day1. MOB will limit total feedings to 30 minutes or less and BF infant every 3 hours. MOB will call RN/LC for latch assistance if needed. LC discussed infant's input and output and the importance of maternal rest, diet and hydration. LC set MOB up with DEBP, and MOB was expressing colostrum in breast flanges. MOB understands that her EBM is safe for 4 hours at room temperature whereas formula once open must be used within 1 hour. LC sent [facility] referral for DEBP. MOB  made aware of O/P services, breastfeeding support groups, community resources, and our phone # for post-discharge questions.    Today's current  feeding plan: 1- BF infant every 3 hours, limit total feedings to 30 minutes or less,( breastfeeding and bottle feeding). Following LPTI feeding guidelines.  2- Continue to supplement infant on Day 2 with (12-14 mls) or more of EBM/formula per feeding. 3- Continue to use DEBP every 3 hours for 15 minutes and give infant back any EBM first before offering formula.  Maternal Data Has patient been taught Hand Expression?: Yes Does the patient have breastfeeding experience prior to this delivery?: Yes How long did the patient breastfeed?: Per MOB, she BF 2nd child one month and 3rd for 2 months  Feeding Mother's Current Feeding Choice: Breast Milk and Formula  LATCH Score Latch: Grasps breast easily, tongue down, lips flanged, rhythmical sucking.  Audible Swallowing: Spontaneous  and intermittent  Type of Nipple: Everted at rest and after stimulation  Comfort (Breast/Nipple): Soft / non-tender  Hold (Positioning): Assistance needed to correctly position infant at breast and maintain latch.  LATCH Score: 9   Lactation Tools Discussed/Used Tools: Pump Breast pump type: Double-Electric Breast Pump Pump Education: Setup, frequency, and cleaning;Milk Storage Reason for Pumping: Infant is LPTI Pumping frequency: MOB will continue to use DEBP evry 3 hours for 15 minutes.  Interventions Interventions: Breast feeding basics reviewed;Skin to skin;Hand pump;Expressed milk;Hand express;Education;LC Services brochure;LPT handout/interventions  Discharge Pump:  (LC sent referral for [facility] DEBP) WIC Program: Yes  Consult Status Consult Status: Follow-up Date: 12/14/22 Follow-up type: In-patient    Frederico Hamman 12/14/2022, 3:42 AM

## 2022-12-14 NOTE — Anesthesia Preprocedure Evaluation (Addendum)
Anesthesia Evaluation  Patient identified by MRN, date of birth, ID band Patient awake    Reviewed: Allergy & Precautions, NPO status , Patient's Chart, lab work & pertinent test results  Airway Mallampati: II       Dental  (+) Teeth Intact   Pulmonary Current Smoker and Patient abstained from smoking.   Pulmonary exam normal        Cardiovascular negative cardio ROS Normal cardiovascular exam     Neuro/Psych Seizures -, Well Controlled,  PSYCHIATRIC DISORDERS  Depression       GI/Hepatic negative GI ROS, Neg liver ROS,,,  Endo/Other  negative endocrine ROS    Renal/GU negative Renal ROS     Musculoskeletal negative musculoskeletal ROS (+)    Abdominal   Peds  Hematology  (+) Blood dyscrasia, anemia   Anesthesia Other Findings   Reproductive/Obstetrics  Delivered roughly 8 hours ago                              Anesthesia Physical Anesthesia Plan  ASA: 2  Anesthesia Plan: Spinal   Post-op Pain Management: Tylenol PO (pre-op)* and Regional block*   Induction: Intravenous  PONV Risk Score and Plan: 1 and Treatment may vary due to age or medical condition  Airway Management Planned: Natural Airway  Additional Equipment: None  Intra-op Plan:   Post-operative Plan:   Informed Consent: I have reviewed the patients History and Physical, chart, labs and discussed the procedure including the risks, benefits and alternatives for the proposed anesthesia with the patient or authorized representative who has indicated his/her understanding and acceptance.       Plan Discussed with: Anesthesiologist and CRNA  Anesthesia Plan Comments: (Lengthy discussion had with patient. She had her epidural placed just prior to delivering, so epidural didn't have a chance to really set up and provide good analgesia for delivery. Patient thinks she felt some numbness after delivery, but can't  quantify it. Explained risks of bolusing an epidural catheter with unsure placement with possible need for general anesthesia vs. delaying procedure and bringing her back for spinal, vs. removing the catheter and performing spinal as primary plan. After much discussion, patient decided that removing the catheter without bolusing and placing spinal was her preferred choice.)       Anesthesia Quick Evaluation
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
## 2022-12-14 NOTE — Plan of Care (Signed)
  Problem: Education: Goal: Knowledge of General Education information will improve Description: Including pain rating scale, medication(s)/side effects and non-pharmacologic comfort measures 12/14/2022 0203 by Tamala Fothergill, RN Outcome: Progressing 12/13/2022 2006 by Tamala Fothergill, RN Outcome: Progressing   Problem: Clinical Measurements: Goal: Ability to maintain clinical measurements within normal limits will improve 12/14/2022 0203 by Tamala Fothergill, RN Outcome: Progressing 12/13/2022 2006 by Tamala Fothergill, RN Outcome: Progressing Goal: Will remain free from infection 12/14/2022 0203 by Tamala Fothergill, RN Outcome: Progressing 12/13/2022 2006 by Tamala Fothergill, RN Outcome: Progressing Goal: Diagnostic test results will improve 12/14/2022 0203 by Tamala Fothergill, RN Outcome: Progressing 12/13/2022 2006 by Tamala Fothergill, RN Outcome: Progressing Goal: Respiratory complications will improve 12/14/2022 0203 by Tamala Fothergill, RN Outcome: Progressing 12/13/2022 2006 by Tamala Fothergill, RN Outcome: Progressing Goal: Cardiovascular complication will be avoided 12/14/2022 0203 by Tamala Fothergill, RN Outcome: Progressing 12/13/2022 2006 by Tamala Fothergill, RN Outcome: Progressing   Problem: Activity: Goal: Risk for activity intolerance will decrease 12/14/2022 0203 by Tamala Fothergill, RN Outcome: Progressing 12/13/2022 2006 by Tamala Fothergill, RN Outcome: Progressing   Problem: Nutrition: Goal: Adequate nutrition will be maintained 12/14/2022 0203 by Tamala Fothergill, RN Outcome: Progressing 12/13/2022 2006 by Tamala Fothergill, RN Outcome: Progressing   Problem: Coping: Goal: Level of anxiety will decrease 12/14/2022 0203 by Tamala Fothergill, RN Outcome: Progressing 12/13/2022 2006 by Tamala Fothergill, RN Outcome: Progressing   Problem: Elimination: Goal: Will not experience complications related to bowel motility 12/14/2022 0203 by Tamala Fothergill, RN Outcome: Progressing 12/13/2022 2006 by Tamala Fothergill, RN Outcome: Progressing Goal: Will not experience complications related to urinary retention 12/14/2022 0203 by Tamala Fothergill, RN Outcome: Progressing 12/13/2022 2006 by Tamala Fothergill, RN Outcome: Progressing   Problem: Pain Management: Goal: General experience of comfort will improve 12/14/2022 0203 by Tamala Fothergill, RN Outcome: Progressing 12/13/2022 2006 by Tamala Fothergill, RN Outcome: Progressing   Problem: Safety: Goal: Ability to remain free from injury will improve 12/14/2022 0203 by Tamala Fothergill, RN Outcome: Progressing 12/13/2022 2006 by Tamala Fothergill, RN Outcome: Progressing   Problem: Skin Integrity: Goal: Risk for impaired skin integrity will decrease 12/14/2022 0203 by Tamala Fothergill, RN Outcome: Progressing 12/13/2022 2006 by Tamala Fothergill, RN Outcome: Progressing

## 2022-12-14 NOTE — Progress Notes (Signed)
Patient desires permanent sterilization.  Other reversible forms of contraception were discussed with patient; she declines all other modalities. We specifically discussed bilateral salpingectomy and she agrees to proceed with this method if technically feasible. Risks of procedure discussed with patient including but not limited to: risk of regret, permanence of method, bleeding, infection, injury to surrounding organs and need for additional procedures.  Failure risk of about 1% with increased risk of ectopic gestation if pregnancy occurs was also discussed with patient.  Also discussed possibility of post-tubal pain syndrome. The patient concurred with the proposed plan, giving informed written consent for the procedures.  Patient has been NPO since last night she will remain NPO for procedure. Anesthesia and OR aware.

## 2022-12-15 LAB — CULTURE, BETA STREP (GROUP B ONLY)

## 2022-12-15 MED ORDER — CYCLOBENZAPRINE HCL 5 MG PO TABS
10.0000 mg | ORAL_TABLET | Freq: Three times a day (TID) | ORAL | Status: DC | PRN
  Administered 2022-12-15 – 2022-12-16 (×3): 10 mg via ORAL
  Filled 2022-12-15 (×3): qty 2

## 2022-12-15 MED ORDER — OXYCODONE HCL 5 MG PO TABS: 5.0000 mg | ORAL_TABLET | Freq: Four times a day (QID) | ORAL | 0 refills | Status: DC | PRN

## 2022-12-15 MED ORDER — OXYCODONE HCL 5 MG PO TABS
5.0000 mg | ORAL_TABLET | Freq: Four times a day (QID) | ORAL | Status: DC | PRN
  Administered 2022-12-15 (×2): 10 mg via ORAL
  Filled 2022-12-15 (×2): qty 2

## 2022-12-15 MED ORDER — GABAPENTIN 100 MG PO CAPS
200.0000 mg | ORAL_CAPSULE | Freq: Three times a day (TID) | ORAL | Status: DC | PRN
  Administered 2022-12-15 – 2022-12-16 (×3): 200 mg via ORAL
  Filled 2022-12-15 (×3): qty 2
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
## 2022-12-15 NOTE — Anesthesia Postprocedure Evaluation (Signed)
Anesthesia Post Note  Patient: Alexis [name]  Procedure(s) Performed: POST PARTUM TUBAL LIGATION (Abdomen)     Anesthesia Type: Spinal Anesthetic complications: no   No notable events documented.  Last Vitals:  Vitals:   12/14/22 1920 12/15/22 0435  BP: 99/62 103/65  Pulse: 64 73  Resp: 20 18  Temp: 36.9 C 36.8 C  SpO2: 99% 100%    Last Pain:  Vitals:   12/15/22 0816  TempSrc:   PainSc: 0-No pain                 Beryle Lathe

## 2022-12-15 NOTE — Progress Notes (Signed)
POSTPARTUM PROGRESS NOTE  Post Partum Day 1 SVD, PP BTL   Subjective:  Alexis [name] is a 27 y.o. [id number] s/p SVD / BTL at [redacted]w[redacted]d.  She reports she is doing well. No acute events overnight. She denies any problems with ambulating, voiding or po intake. Denies nausea or vomiting.  Pain is moderately controlled.  Lochia is adequate.  Objective: Blood pressure 113/79, pulse 75, temperature 97.9 F (36.6 C), temperature source Oral, resp. rate 16, height 5\' 4"  (1.626 m), weight 78.5 kg, last menstrual period 02/25/2022, SpO2 100%, unknown if currently breastfeeding.  Physical Exam:  General: alert, cooperative and no distress Chest: no respiratory distress Heart:regular rate, distal pulses intact Uterine Fundus: firm, appropriately tender DVT Evaluation: No calf swelling or tenderness Extremities: trace edema Skin: warm, dry  Recent Labs    12/13/22 1747  HGB 9.6*  HCT 29.8*    Assessment/Plan: Alexis [name] is a 27 y.o. [id number] s/p SVD/PP BTL at [redacted]w[redacted]d   PPD#1 - Doing well  Routine postpartum care Adding multimodals for pain control - Gabapentin and Flexeril PRN Contraception: BTL Feeding: Bottle  Dispo: Plan for discharge 10/25 per patient request given pain control.   LOS: 2 days   Hessie Dibble, MD OB Fellow  12/15/2022, 4:20 PM

## 2022-12-15 NOTE — Lactation Note (Signed)
This note was copied from a baby's chart. Lactation Consultation Note  Patient Name: Alexis [name] [id number] Date: 12/15/2022 Age:29 hours   P4- LC entered room to consult MOB, but she was asleep. LC will check on MOB tomorrow morning, unless called by nurse/pt tonight before 2300.  Dema Severin 12/15/2022, 7:07 PM

## 2022-12-15 NOTE — Progress Notes (Signed)
Received call that patient not wanting to be discharge 2/2 pain.   Dr. Judd Lien

## 2022-12-15 NOTE — Lactation Note (Signed)
This note was copied from a baby's chart. Lactation Consultation Note  Patient Name: Alexis [name] [id number] Date: 12/15/2022 Age:29 hours, P3 , experienced BF  Reason for consult: Follow-up assessment;Late-preterm 34-36.6wks As LC entered the room mom woke up. Baby woke up and orientee changed wet and stool diaper. Mom attempted to fed the bottle and the baby wasn't interested. Mom aware she has 60 mins on the bottle.  Per mom has not pumped and usually waits until she goes home to pump. LC encouraged to call for LC if she desires to pump.   Maternal Data Has patient been taught Hand Expression?: Yes Does the patient have breastfeeding experience prior to this delivery?: Yes  Feeding Mother's Current Feeding Choice: Breast Milk and Formula Nipple Type: Nfant Extra Slow Flow (gold)  LATCH Score - Latch 9     Lactation Tools Discussed/Used Tools: Pump Breast pump type: Double-Electric Breast Pump Pump Education: Setup, frequency, and cleaning;Milk Storage (DEBP has been set up and per mom has not pumped and usually waits to start until she goes home)  Interventions Interventions: Breast feeding basics reviewed;Education  Discharge Pump: DEBP;Personal  Consult Status Consult Status: Follow-up Date: 12/16/22 Follow-up type: In-patient    Alexis [name] 12/15/2022, 11:27 AM

## 2022-12-16 ENCOUNTER — Ambulatory Visit: Payer: Medicaid Other

## 2022-12-19 ENCOUNTER — Ambulatory Visit: Payer: Medicaid Other

## 2022-12-19 ENCOUNTER — Encounter (HOSPITAL_BASED_OUTPATIENT_CLINIC_OR_DEPARTMENT_OTHER): Payer: Medicaid Other | Admitting: Certified Nurse Midwife

## 2022-12-19 LAB — SURGICAL PATHOLOGY

## 2022-12-22 ENCOUNTER — Ambulatory Visit: Payer: Medicaid Other | Admitting: Sports Medicine

## 2022-12-26 ENCOUNTER — Encounter (HOSPITAL_BASED_OUTPATIENT_CLINIC_OR_DEPARTMENT_OTHER): Payer: Medicaid Other | Admitting: Certified Nurse Midwife

## 2023-01-02 ENCOUNTER — Encounter (HOSPITAL_BASED_OUTPATIENT_CLINIC_OR_DEPARTMENT_OTHER): Payer: Medicaid Other | Admitting: Certified Nurse Midwife

## 2023-01-02 ENCOUNTER — Ambulatory Visit: Payer: Medicaid Other

## 2023-01-03 ENCOUNTER — Telehealth (HOSPITAL_COMMUNITY): Payer: Self-pay

## 2023-01-03 DIAGNOSIS — Z1331 Encounter for screening for depression: Secondary | ICD-10-CM

## 2023-01-03 NOTE — Telephone Encounter (Addendum)
01/03/2023 1609  Name: Alexis [name] MRN: [id number] DOB: Nov 10, 1995  Reason for Call:  Transition of Care Hospital Discharge Call  Contact Status: Patient Contact Status: Complete  Language assistant needed: Interpreter Mode: Interpreter Not Needed        Follow-Up Questions: Do You Have Any Concerns About Your Health As You Heal From Delivery?: No Do You Have Any Concerns About Your Infants Health?: No  Edinburgh Postnatal Depression Scale:  In the Past 7 Days: I have been able to laugh and see the funny side of things.: Not quite so much now I have looked forward with enjoyment to things.: As much as I ever did I have blamed myself unnecessarily when things went wrong.: Yes, most of the time I have been anxious or worried for no good reason.: Yes, sometimes I have felt scared or panicky for no good reason.: No, not much Things have been getting on top of me.: No, most of the time I have coped quite well I have been so unhappy that I have had difficulty sleeping.: Not very often I have felt sad or miserable.: Not very often I have been so unhappy that I have been crying.: Only occasionally The thought of harming myself has occurred to me.: Never [name] Postnatal Depression Scale Total: (!) 11  RN told patient about Maternal Mental Health Resources ([facility], National Maternal Mental Health Hotline, Postpartum Support International, National Suicide and Crisis Lifeline, postpartum.net). Also told patient about [facility] support group offerings. Will e-mail these resources to patient as well. Patient consents to having a virtual counseling appointment. Referral placed for integrated behavioral health.  PHQ2-9 Depression Scale:     Discharge Follow-up: Edinburgh score requires follow up?: Yes Provider notified of Edinburgh score?: Yes Have you already been referred for a counseling appointment?: No Patient was advised of the following resources:: Support  Group, Breastfeeding Support Group Patient referred to:: OB  Post-discharge interventions: Reviewed Newborn Safe Sleep Practices Maternal Mental Health Resources provided   Signature  Signe Colt

## 2023-01-28 ENCOUNTER — Telehealth: Payer: Medicaid Other | Admitting: Family Medicine

## 2023-01-28 DIAGNOSIS — K047 Periapical abscess without sinus: Secondary | ICD-10-CM

## 2023-01-28 MED ORDER — PENICILLIN V POTASSIUM 500 MG PO TABS: 500.0000 mg | ORAL_TABLET | Freq: Three times a day (TID) | ORAL | 0 refills | Status: AC

## 2023-01-28 NOTE — Patient Instructions (Signed)
Dental Abscess  A dental abscess is an infection around a tooth that may involve pain, swelling, and a collection of pus, as well as other symptoms. Treatment is important to help with symptoms and to prevent the infection from spreading. The general types of dental abscesses are: Pulpal abscess. This abscess may form from the inner part of the tooth (pulp). Periodontal abscess. This abscess may form from the gum. What are the causes? This condition is caused by a bacterial infection in or around the tooth. It may result from: Severe tooth decay (cavities). Trauma to the tooth, such as a broken or chipped tooth. What increases the risk? This condition is more likely to develop in males. It is also more likely to develop in people who: Have cavities. Have severe gum disease. Eat sugary snacks between meals. Use tobacco products. Have diabetes. Have a weakened disease-fighting system (immune system). Do not brush and care for their teeth regularly. What are the signs or symptoms? Mild symptoms of this condition include: Tenderness. Bad breath. Fever. A bitter taste in the mouth. Pain in and around the infected tooth. Moderate symptoms of this condition include: Swollen neck glands. Chills. Pus drainage. Swelling and redness around the infected tooth, in the mouth, or in the face. Severe pain in and around the infected tooth. Severe symptoms of this condition include: Difficulty swallowing. Difficulty opening the mouth. Nausea. Vomiting. How is this diagnosed? This condition is diagnosed based on: Your symptoms and your medical and dental history. An examination of the infected tooth. During the exam, your dental care provider may tap on the infected tooth. You may also need to have X-rays taken of the affected area. How is this treated? This condition is treated by getting rid of the infection. This may be done with: Antibiotic medicines. These may be used in certain  situations. Antibacterial mouth rinse. Incision and drainage. This procedure is done by making an incision in the abscess to drain out the pus. Removing pus is the first priority in treating an abscess. A root canal. This may be performed to save the tooth. Your dental care provider accesses the visible part of your tooth (crown) with a drill and removes any infected pulp. Then the space is filled and sealed off. Tooth extraction. The tooth is pulled out if it cannot be saved by other treatment. You may also receive treatment for pain, such as: Acetaminophen or NSAIDs. Gels that contain a numbing medicine. An injection to block the pain near your nerve. Follow these instructions at home: Medicines Take over-the-counter and prescription medicines only as told by your dental care provider. If you were prescribed an antibiotic, take it as told by your dental care provider. Do not stop taking the antibiotic even if you start to feel better. If you were prescribed a gel that contains a numbing medicine, use it exactly as told in the directions. Do not use these gels for children who are younger than 2 years of age. Use an antibacterial mouth rinse as told by your dental care provider. General instructions  Gargle with a mixture of salt and water 3-4 times a day or as needed. To make salt water, completely dissolve -1 tsp (3-6 g) of salt in 1 cup (237 mL) of warm water. Eat a soft diet while your abscess is healing. Drink enough fluid to keep your urine pale yellow. Do not apply heat to the outside of your mouth. Do not use any products that contain nicotine or tobacco. These   products include cigarettes, chewing tobacco, and vaping devices, such as e-cigarettes. If you need help quitting, ask your dental care provider. Keep all follow-up visits. This is important. How is this prevented?  Excellent dental home care, which includes brushing your teeth every morning and night with fluoride  toothpaste. Floss one time each day. Get regularly scheduled dental cleanings. Consider having a dental sealant applied on teeth that have deep grooves to prevent cavities. Drink fluoridated water regularly. This includes most tap water. Check the label on bottled water to see if it contains fluoride. Reduce or eliminate sugary drinks. Eat healthy meals and snacks. Wear a mouth guard or face shield to protect your teeth while playing sports. Contact a health care provider if: Your pain is worse and is not helped by medicine. You have swelling. You see pus around the tooth. You have a fever or chills. Get help right away if: Your symptoms suddenly get worse. You have a very bad headache. You have problems breathing or swallowing. You have trouble opening your mouth. You have swelling in your neck or around your eye. These symptoms may represent a serious problem that is an emergency. Do not wait to see if the symptoms will go away. Get medical help right away. Call your local emergency services (911 in the [place] Do not drive yourself to the hospital. Summary A dental abscess is a collection of pus in or around a tooth that results from an infection. A dental abscess may result from severe tooth decay, trauma to the tooth, or severe gum disease around a tooth. Symptoms include severe pain, swelling, redness, and drainage of pus in and around the infected tooth. The first priority in treating a dental abscess is to drain out the pus. Treatment may also involve removing damage inside the tooth (root canal) or extracting the tooth. This information is not intended to replace advice given to you by your health care provider. Make sure you discuss any questions you have with your health care provider. Document Revised: 04/16/2020 Document Reviewed: 04/16/2020 Elsevier Patient Education  2024 [employer] Inc.  
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
## 2023-01-30 ENCOUNTER — Encounter (HOSPITAL_BASED_OUTPATIENT_CLINIC_OR_DEPARTMENT_OTHER): Payer: Self-pay

## 2023-01-30 ENCOUNTER — Ambulatory Visit (HOSPITAL_BASED_OUTPATIENT_CLINIC_OR_DEPARTMENT_OTHER): Payer: Medicaid Other | Admitting: Certified Nurse Midwife

## 2023-02-03 ENCOUNTER — Encounter (HOSPITAL_BASED_OUTPATIENT_CLINIC_OR_DEPARTMENT_OTHER): Payer: Self-pay

## 2023-02-03 ENCOUNTER — Telehealth (HOSPITAL_BASED_OUTPATIENT_CLINIC_OR_DEPARTMENT_OTHER): Payer: Self-pay | Admitting: Certified Nurse Midwife

## 2023-02-03 ENCOUNTER — Ambulatory Visit (HOSPITAL_BASED_OUTPATIENT_CLINIC_OR_DEPARTMENT_OTHER): Payer: Medicaid Other | Admitting: Certified Nurse Midwife

## 2023-02-03 NOTE — Telephone Encounter (Signed)
Called patient and left a message to call the office back to reschedule the missed appointment .

## 2023-03-07 ENCOUNTER — Telehealth: Payer: Medicaid Other | Admitting: Nurse Practitioner

## 2023-03-07 DIAGNOSIS — R1031 Right lower quadrant pain: Secondary | ICD-10-CM | POA: Diagnosis not present

## 2023-03-07 NOTE — Patient Instructions (Signed)
Based on what you shared with me, I feel your condition warrants further evaluation as soon as possible at an Emergency department.  ? ?  ?If you are having a true medical emergency please call 911.   ?  ? ?Emergency Department-[facility]  ?Get Driving Directions  ?[phone]  ?[address]  ?[place], [place] [address]  ?Open 24/7/365  ?  ?  ?[facility] [address] [facility]  ?Get Driving Directions  ?[address]  ?[place], [place] [address]  ?Open 24/7/365  ?  ?Emergency Department- [facility]  ?Get Driving Directions  ?[phone]  ?[address]  ?[place], [place] [address]  ?Open 24/7/365  ?  ?  ?[facility]  ?Get Driving Directions  ?[phone]  ?[address]  ?[place], [place] [address]  ?Open 24/7/365  ?  ?[place]  ?Emergency Department- [facility]  ?Get Driving Directions  ?[phone]  ?[address]  ?[place], [place] [address]  ?Open 24/7/365  ?  ?[place]  ?Emergency Department- [facility] [place]  ?Get Driving Directions  ?[address]  ?[place], [place] [address]  ?Open 24/7/365  ?  ?[place]  ?Emergency Department- [facility]  ?Get Driving Directions  ?[phone]  ?[address]  ?[place],  [address]  ?Open 24/7/365  ?  ?
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
## 2023-03-10 ENCOUNTER — Ambulatory Visit (HOSPITAL_BASED_OUTPATIENT_CLINIC_OR_DEPARTMENT_OTHER): Payer: Medicaid Other | Admitting: Certified Nurse Midwife

## 2023-03-10 ENCOUNTER — Telehealth (HOSPITAL_BASED_OUTPATIENT_CLINIC_OR_DEPARTMENT_OTHER): Payer: Self-pay | Admitting: *Deleted

## 2023-03-10 NOTE — Telephone Encounter (Signed)
Called patient and left a message to call the office back to reschedule the  missed appointments that was for today.

## 2023-03-16 ENCOUNTER — Encounter (HOSPITAL_BASED_OUTPATIENT_CLINIC_OR_DEPARTMENT_OTHER): Payer: Self-pay | Admitting: Certified Nurse Midwife

## 2023-03-16 ENCOUNTER — Ambulatory Visit (HOSPITAL_BASED_OUTPATIENT_CLINIC_OR_DEPARTMENT_OTHER): Payer: Medicaid Other | Admitting: Certified Nurse Midwife

## 2023-03-16 ENCOUNTER — Other Ambulatory Visit (HOSPITAL_COMMUNITY)
Admission: RE | Admit: 2023-03-16 | Discharge: 2023-03-16 | Disposition: A | Discharge status: Home / Self Care | Payer: Medicaid Other | Source: Ambulatory Visit | Attending: Certified Nurse Midwife | Admitting: Certified Nurse Midwife

## 2023-03-16 VITALS — BP 137/97 | HR 68 | Ht 64.0 in | Wt 160.8 lb

## 2023-03-16 DIAGNOSIS — N898 Other specified noninflammatory disorders of vagina: Secondary | ICD-10-CM | POA: Insufficient documentation

## 2023-03-16 DIAGNOSIS — Z113 Encounter for screening for infections with a predominantly sexual mode of transmission: Secondary | ICD-10-CM | POA: Diagnosis present
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
## 2023-03-17 ENCOUNTER — Encounter (HOSPITAL_BASED_OUTPATIENT_CLINIC_OR_DEPARTMENT_OTHER): Payer: Self-pay | Admitting: Certified Nurse Midwife

## 2023-03-17 ENCOUNTER — Encounter (HOSPITAL_BASED_OUTPATIENT_CLINIC_OR_DEPARTMENT_OTHER): Payer: Self-pay | Admitting: Obstetrics & Gynecology

## 2023-03-17 ENCOUNTER — Other Ambulatory Visit (HOSPITAL_BASED_OUTPATIENT_CLINIC_OR_DEPARTMENT_OTHER): Payer: Self-pay | Admitting: Certified Nurse Midwife

## 2023-03-17 DIAGNOSIS — B9689 Other specified bacterial agents as the cause of diseases classified elsewhere: Secondary | ICD-10-CM

## 2023-03-17 LAB — CERVICOVAGINAL ANCILLARY ONLY
Bacterial Vaginitis (gardnerella): POSITIVE — AB
Candida Glabrata: NEGATIVE
Candida Vaginitis: NEGATIVE
Chlamydia: NEGATIVE
Comment: NEGATIVE
Comment: NEGATIVE
Comment: NEGATIVE
Comment: NEGATIVE
Comment: NEGATIVE
Comment: NORMAL
Neisseria Gonorrhea: POSITIVE — AB
Trichomonas: NEGATIVE

## 2023-03-17 LAB — RPR: RPR Ser Ql: NONREACTIVE

## 2023-03-17 LAB — HSV 1 AND 2 AB, IGG
HSV 1 Glycoprotein G Ab, IgG: NONREACTIVE
HSV 2 IgG, Type Spec: REACTIVE — AB

## 2023-03-17 LAB — HEPATITIS C ANTIBODY: Hep C Virus Ab: NONREACTIVE

## 2023-03-17 LAB — HIV ANTIBODY (ROUTINE TESTING W REFLEX): HIV Screen 4th Generation wRfx: NONREACTIVE

## 2023-03-17 LAB — HEPATITIS B SURFACE ANTIGEN: Hepatitis B Surface Ag: NEGATIVE

## 2023-03-17 MED ORDER — METRONIDAZOLE 500 MG PO TABS: 500.0000 mg | ORAL_TABLET | Freq: Two times a day (BID) | ORAL | 3 refills | Status: AC

## 2023-03-27 ENCOUNTER — Ambulatory Visit (HOSPITAL_BASED_OUTPATIENT_CLINIC_OR_DEPARTMENT_OTHER): Payer: Self-pay | Admitting: Certified Nurse Midwife

## 2023-03-27 ENCOUNTER — Ambulatory Visit (HOSPITAL_BASED_OUTPATIENT_CLINIC_OR_DEPARTMENT_OTHER): Payer: Medicaid Other | Admitting: Certified Nurse Midwife

## 2023-03-30 ENCOUNTER — Encounter (HOSPITAL_BASED_OUTPATIENT_CLINIC_OR_DEPARTMENT_OTHER): Payer: Self-pay | Admitting: *Deleted

## 2023-04-08 ENCOUNTER — Emergency Department (HOSPITAL_BASED_OUTPATIENT_CLINIC_OR_DEPARTMENT_OTHER)
Admission: EM | Admit: 2023-04-08 | Discharge: 2023-04-09 | Disposition: A | Discharge status: Home / Self Care | Payer: Medicaid Other | Attending: Emergency Medicine | Admitting: Emergency Medicine

## 2023-04-08 ENCOUNTER — Encounter (HOSPITAL_BASED_OUTPATIENT_CLINIC_OR_DEPARTMENT_OTHER): Payer: Self-pay

## 2023-04-08 ENCOUNTER — Other Ambulatory Visit: Payer: Self-pay

## 2023-04-08 DIAGNOSIS — Z8616 Personal history of COVID-19: Secondary | ICD-10-CM | POA: Diagnosis not present

## 2023-04-08 DIAGNOSIS — R509 Fever, unspecified: Secondary | ICD-10-CM | POA: Diagnosis present

## 2023-04-08 DIAGNOSIS — J101 Influenza due to other identified influenza virus with other respiratory manifestations: Secondary | ICD-10-CM | POA: Diagnosis not present

## 2023-04-08 NOTE — ED Triage Notes (Addendum)
Pt BIB [facility] from home d/t HA, cough, body aches, flu-like s/s for 1 day.  She took Tylenol at home @ 2000 1000 mg PO

## 2023-04-09 ENCOUNTER — Emergency Department (HOSPITAL_BASED_OUTPATIENT_CLINIC_OR_DEPARTMENT_OTHER): Payer: Medicaid Other

## 2023-04-09 LAB — RESP PANEL BY RT-PCR (RSV, FLU A&B, COVID)  RVPGX2
Influenza A by PCR: POSITIVE — AB
Influenza B by PCR: NEGATIVE
Resp Syncytial Virus by PCR: NEGATIVE
SARS Coronavirus 2 by RT PCR: NEGATIVE

## 2023-04-09 MED ORDER — PROCHLORPERAZINE 25 MG RE SUPP: 25.0000 mg | Freq: Two times a day (BID) | RECTAL | 0 refills | Status: AC | PRN

## 2023-04-09 MED ORDER — OSELTAMIVIR PHOSPHATE 75 MG PO CAPS: 75.0000 mg | ORAL_CAPSULE | Freq: Two times a day (BID) | ORAL | 0 refills | Status: DC

## 2023-04-09 MED ORDER — PROCHLORPERAZINE EDISYLATE 10 MG/2ML IJ SOLN
10.0000 mg | Freq: Once | INTRAMUSCULAR | Status: AC
  Administered 2023-04-09: 10 mg via INTRAMUSCULAR
  Filled 2023-04-09: qty 2

## 2023-04-09 MED ORDER — KETOROLAC TROMETHAMINE 60 MG/2ML IM SOLN
30.0000 mg | Freq: Once | INTRAMUSCULAR | Status: AC
  Administered 2023-04-09: 30 mg via INTRAMUSCULAR
  Filled 2023-04-09: qty 2

## 2023-04-09 NOTE — Discharge Instructions (Signed)
 You may take over-the-counter medicine for symptomatic relief, such as Tylenol, Motrin, TheraFlu, Alka seltzer , black elderberry, etc. Please limit acetaminophen (Tylenol) to 4000 mg and Ibuprofen (Motrin, Advil, etc.) to 2400 mg for a 24hr period. Please note that other over-the-counter medicine may contain acetaminophen or ibuprofen as a component of their ingredients.
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
## 2023-07-19 ENCOUNTER — Encounter (HOSPITAL_BASED_OUTPATIENT_CLINIC_OR_DEPARTMENT_OTHER): Payer: Self-pay | Admitting: *Deleted

## 2023-07-19 ENCOUNTER — Telehealth: Admitting: Physician Assistant

## 2023-07-19 ENCOUNTER — Telehealth: Admitting: Nurse Practitioner

## 2023-07-19 ENCOUNTER — Emergency Department (HOSPITAL_BASED_OUTPATIENT_CLINIC_OR_DEPARTMENT_OTHER)
Admission: EM | Admit: 2023-07-19 | Discharge: 2023-07-20 | Disposition: A | Discharge status: Home / Self Care | Attending: Emergency Medicine | Admitting: Emergency Medicine

## 2023-07-19 ENCOUNTER — Other Ambulatory Visit: Payer: Self-pay

## 2023-07-19 DIAGNOSIS — T304 Corrosion of unspecified body region, unspecified degree: Secondary | ICD-10-CM

## 2023-07-19 DIAGNOSIS — X19XXXA Contact with other heat and hot substances, initial encounter: Secondary | ICD-10-CM | POA: Diagnosis not present

## 2023-07-19 DIAGNOSIS — S6991XA Unspecified injury of right wrist, hand and finger(s), initial encounter: Secondary | ICD-10-CM | POA: Diagnosis present

## 2023-07-19 DIAGNOSIS — T23371A Burn of third degree of right wrist, initial encounter: Secondary | ICD-10-CM | POA: Diagnosis not present

## 2023-07-19 DIAGNOSIS — T300 Burn of unspecified body region, unspecified degree: Secondary | ICD-10-CM

## 2023-07-19 MED ORDER — OXYCODONE HCL 5 MG PO TABS
5.0000 mg | ORAL_TABLET | Freq: Once | ORAL | Status: AC
  Administered 2023-07-20: 5 mg via ORAL
  Filled 2023-07-19: qty 1

## 2023-07-19 MED ORDER — OXYCODONE HCL 5 MG PO TABS: 5.0000 mg | ORAL_TABLET | Freq: Four times a day (QID) | ORAL | 0 refills | Status: AC | PRN

## 2023-07-19 MED ORDER — BACITRACIN ZINC 500 UNIT/GM EX OINT
TOPICAL_OINTMENT | Freq: Two times a day (BID) | CUTANEOUS | Status: DC
  Administered 2023-07-20 (×2): 31.5 via TOPICAL
  Filled 2023-07-19: qty 28.35
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
## 2023-07-19 NOTE — Progress Notes (Signed)
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
## 2023-07-28 ENCOUNTER — Telehealth

## 2023-07-28 NOTE — Progress Notes (Signed)
 Pt did not show for visit [facility]

## 2023-11-25 ENCOUNTER — Telehealth: Admitting: Nurse Practitioner

## 2023-11-25 ENCOUNTER — Encounter

## 2024-03-26 ENCOUNTER — Telehealth: Admitting: Nurse Practitioner

## 2024-03-26 DIAGNOSIS — J029 Acute pharyngitis, unspecified: Secondary | ICD-10-CM

## 2024-03-26 MED ORDER — AMOXICILLIN 500 MG PO CAPS: 500.0000 mg | ORAL_CAPSULE | Freq: Two times a day (BID) | ORAL | 0 refills | Status: AC

## 2024-03-27 ENCOUNTER — Other Ambulatory Visit: Payer: Self-pay

## 2024-03-27 ENCOUNTER — Encounter (HOSPITAL_BASED_OUTPATIENT_CLINIC_OR_DEPARTMENT_OTHER): Payer: Self-pay

## 2024-03-27 ENCOUNTER — Emergency Department (HOSPITAL_BASED_OUTPATIENT_CLINIC_OR_DEPARTMENT_OTHER)
Admission: EM | Admit: 2024-03-27 | Discharge: 2024-03-28 | Disposition: A | Attending: Emergency Medicine | Admitting: Emergency Medicine

## 2024-03-27 DIAGNOSIS — Z720 Tobacco use: Secondary | ICD-10-CM | POA: Insufficient documentation

## 2024-03-27 DIAGNOSIS — J020 Streptococcal pharyngitis: Secondary | ICD-10-CM | POA: Insufficient documentation

## 2024-03-27 DIAGNOSIS — H6121 Impacted cerumen, right ear: Secondary | ICD-10-CM | POA: Insufficient documentation

## 2024-03-27 LAB — CBC WITH DIFFERENTIAL/PLATELET
Abs Immature Granulocytes: 0.03 10*3/uL (ref 0.00–0.07)
Basophils Absolute: 0 10*3/uL (ref 0.0–0.1)
Basophils Relative: 1 %
Eosinophils Absolute: 0.2 10*3/uL (ref 0.0–0.5)
Eosinophils Relative: 3 %
HCT: 31.1 % — ABNORMAL LOW (ref 36.0–46.0)
Hemoglobin: 10.1 g/dL — ABNORMAL LOW (ref 12.0–15.0)
Immature Granulocytes: 0 %
Lymphocytes Relative: 21 %
Lymphs Abs: 1.7 10*3/uL (ref 0.7–4.0)
MCH: 22.6 pg — ABNORMAL LOW (ref 26.0–34.0)
MCHC: 32.5 g/dL (ref 30.0–36.0)
MCV: 69.7 fL — ABNORMAL LOW (ref 80.0–100.0)
Monocytes Absolute: 0.4 10*3/uL (ref 0.1–1.0)
Monocytes Relative: 5 %
Neutro Abs: 5.6 10*3/uL (ref 1.7–7.7)
Neutrophils Relative %: 70 %
Platelets: 337 10*3/uL (ref 150–400)
RBC: 4.46 MIL/uL (ref 3.87–5.11)
RDW: 15.8 % — ABNORMAL HIGH (ref 11.5–15.5)
WBC: 8 10*3/uL (ref 4.0–10.5)
nRBC: 0 % (ref 0.0–0.2)

## 2024-03-27 LAB — GROUP A STREP BY PCR: Group A Strep by PCR: DETECTED — AB

## 2024-03-27 LAB — BASIC METABOLIC PANEL WITH GFR
Anion gap: 10 (ref 5–15)
BUN: 8 mg/dL (ref 6–20)
CO2: 26 mmol/L (ref 22–32)
Calcium: 8.5 mg/dL — ABNORMAL LOW (ref 8.9–10.3)
Chloride: 104 mmol/L (ref 98–111)
Creatinine, Ser: 0.58 mg/dL (ref 0.44–1.00)
GFR, Estimated: >60 mL/min
Glucose, Bld: 84 mg/dL (ref 70–99)
Potassium: 3.9 mmol/L (ref 3.5–5.1)
Sodium: 140 mmol/L (ref 135–145)

## 2024-03-27 LAB — LACTIC ACID, PLASMA: Lactic Acid, Venous: 0.8 mmol/L (ref 0.5–1.9)

## 2024-03-27 MED ORDER — KETOROLAC TROMETHAMINE 15 MG/ML IJ SOLN
15.0000 mg | Freq: Once | INTRAMUSCULAR | Status: AC
  Administered 2024-03-27: 15 mg via INTRAVENOUS
  Filled 2024-03-27: qty 1

## 2024-03-27 MED ORDER — DEXAMETHASONE SOD PHOSPHATE PF 10 MG/ML IJ SOLN
10.0000 mg | Freq: Once | INTRAMUSCULAR | Status: AC
  Administered 2024-03-27: 10 mg via INTRAVENOUS

## 2024-03-27 MED ORDER — AMOXICILLIN 400 MG/5ML PO SUSR
500.0000 mg | Freq: Two times a day (BID) | ORAL | Status: DC
  Administered 2024-03-27: 500 mg via ORAL
  Filled 2024-03-27: qty 10

## 2024-03-27 MED ORDER — DEXAMETHASONE SOD PHOSPHATE PF 10 MG/ML IJ SOLN
10.0000 mg | Freq: Once | INTRAMUSCULAR | Status: DC
  Filled 2024-03-27: qty 1

## 2024-03-27 MED ORDER — BENZOCAINE 20 % MT AERO
INHALATION_SPRAY | Freq: Once | OROMUCOSAL | Status: AC
  Administered 2024-03-27: 1 via OROMUCOSAL
  Filled 2024-03-27: qty 57

## 2024-03-27 MED ORDER — SODIUM CHLORIDE 0.9 % IV BOLUS
1000.0000 mL | Freq: Once | INTRAVENOUS | Status: AC
  Administered 2024-03-27: 1000 mL via INTRAVENOUS

## 2024-03-27 MED ORDER — PHENOL 1.4 % MT LIQD: 1.0000 | OROMUCOSAL | Status: DC | PRN

## 2024-03-27 NOTE — ED Provider Notes (Signed)
 " [facility] EMERGENCY DEPARTMENT AT [facility] Provider Note   CSN: [id number] Arrival date & time: 03/27/24  2214     Patient presents with: Generalized Body Aches and Sore Throat   Alexis [name] is a 29 y.o. female with past medical history of tobacco abuse, seizure disorder, bilateral salpingectomy presents to emergency department for evaluation of sore throat, body aches.  Was evaluated at virtual visit yesterday and provided amoxicillin  for suspected strep pharyngitis but has had difficulty with swallowing pills so has not had any.  Also endorses difficulty with swallowing water .  {Add pertinent medical, surgical, social history, OB history to [id number]}  Sore Throat      Prior to Admission medications  Medication Sig Start Date End Date Taking? Authorizing Provider  amoxicillin  (AMOXIL ) 500 MG capsule Take 1 capsule (500 mg total) by mouth 2 (two) times daily for 10 days. 03/26/24 04/05/24  Leath-Warren, Etta PARAS, NP  metroNIDAZOLE  (FLAGYL ) 500 MG tablet Take 1 tablet (500 mg total) by mouth 2 (two) times daily. 03/17/23   Lo, Arland POUR, CNM  oxyCODONE  (OXY IR/ROXICODONE ) 5 MG immediate release tablet Take 1-2 tablets (5-10 mg total) by mouth every 6 (six) hours as needed for severe pain (pain score 7-10). 07/19/23   Donnajean Lynwood DEL, PA-C  prochlorperazine  (COMPAZINE ) 25 MG suppository Place 1 suppository (25 mg total) rectally every 12 (twelve) hours as needed for nausea or vomiting. 04/09/23   Trine Raynell Moder, MD    Allergies: Patient has no known allergies.    Review of Systems  HENT:  Positive for trouble swallowing.     Updated Vital Signs BP 129/88   Pulse 80   Temp 97.7 F (36.5 C) (Oral)   Resp 18   Ht 5' 4 (1.626 m)   Wt 68 kg   SpO2 100%   BMI 25.75 kg/m   Physical Exam Vitals and nursing note reviewed.  Constitutional:      General: She is not in acute distress.    Appearance: Normal appearance. She is not ill-appearing.  HENT:      Head: Normocephalic and atraumatic.     Right Ear: External ear normal. There is impacted cerumen.     Left Ear: Tympanic membrane, ear canal and external ear normal.     Mouth/Throat:     Mouth: Mucous membranes are moist.     Pharynx: Posterior oropharyngeal erythema present. No oropharyngeal exudate.     Tonsils: 4+ on the right. 4+ on the left.     Comments: Submandibular tenderness wo swelling nor obvious masses. Uvula midline. No abscess, fluctuance, erythema noted in mouth Eyes:     General: No scleral icterus.       Right eye: No discharge.        Left eye: No discharge.     Conjunctiva/sclera: Conjunctivae normal.  Cardiovascular:     Rate and Rhythm: Normal rate.     Pulses: Normal pulses.  Pulmonary:     Effort: Pulmonary effort is normal. No respiratory distress.     Breath sounds: Normal breath sounds. No stridor. No wheezing or rhonchi.  Chest:     Chest wall: No tenderness.  Abdominal:     General: There is no distension.     Palpations: Abdomen is soft. There is no mass.     Tenderness: There is no abdominal tenderness. There is no guarding.  Musculoskeletal:     Cervical back: Normal range of motion and neck supple. No rigidity or tenderness.  Lymphadenopathy:     Cervical: No cervical adenopathy.  Skin:    General: Skin is warm.     Capillary Refill: Capillary refill takes less than 2 seconds.     Coloration: Skin is not jaundiced or pale.  Neurological:     Mental Status: She is alert. Mental status is at baseline.     (all labs ordered are listed, but only abnormal results are displayed) Labs Reviewed  GROUP A STREP BY PCR - Abnormal; Notable for the following components:      Result Value   Group A Strep by PCR DETECTED (*)    All other components within normal limits  CBC WITH DIFFERENTIAL/PLATELET - Abnormal; Notable for the following components:   Hemoglobin 10.1 (*)    HCT 31.1 (*)    MCV 69.7 (*)    MCH 22.6 (*)    RDW 15.8 (*)    All other  components within normal limits  BASIC METABOLIC PANEL WITH GFR  LACTIC ACID, PLASMA  LACTIC ACID, PLASMA    EKG: None  Radiology: No results found.   Medications Ordered in the ED  amoxicillin  (AMOXIL ) 400 MG/5ML suspension 500 mg (500 mg Oral Given 03/27/24 2339)  Benzocaine  (HURRCAINE) 20 % mouth spray (1 Application Mouth/Throat Given 03/27/24 2303)  sodium chloride  0.9 % bolus 1,000 mL (1,000 mLs Intravenous New Bag/Given 03/27/24 2303)  ketorolac  (TORADOL ) 15 MG/ML injection 15 mg (15 mg Intravenous Given 03/27/24 2304)  dexamethasone  (DECADRON ) injection 10 mg (10 mg Intravenous Given 03/27/24 2304)      {Click here for ABCD2, HEART and other calculators REFRESH Note before signing:1}                              Medical Decision Making Amount and/or Complexity of Data Reviewed Labs: ordered. Radiology: ordered.  Risk OTC drugs. Prescription drug management.   Patient presents to the ED for concern of ST, this involves an extensive number of treatment options, and is a complaint that carries with it a high risk of complications and morbidity.  The differential diagnosis includes strep, tonsillar abscess, ludwig angina   Co morbidities that complicate the patient evaluation  none   Additional history obtained:  Additional history obtained from Nursing and Outside Medical Records   External records from outside source obtained and reviewed including triage RN note, telemedicine note from yesterday   Lab Tests:  I Ordered, and personally interpreted labs.  The pertinent results include:   Strep positive   Imaging Studies ordered:  I ordered imaging studies including CT soft tissue  Pending at sign out    Medicines ordered and prescription drug management:  I ordered medication including decadron , hurricane spray, LR, toradol   for ST, strep  Reevaluation of the patient after these medicines showed that the patient improved I have reviewed the patients home  medicines and have made adjustments as needed    Problem List / ED Course:  Strep pharyngitis Vital signs hemodynamic stable no fever or tachycardia No signs of sepsis.  No leukocytosis. ED exam notable for tonsils 4+ bilaterally.  No drooling.  Patient reports significant difficulty with swallowing and has not taken her amoxicillin  as prescribed to her yesterday at telemedicine visit for suspected strep pharyngitis secondary to this Provided Decadron , IVF, Toradol , hurricane spray, amoxicillin  suspension in ED Did obtain labs, CT as patient has difficulty swallowing, worsening pain.  This is pending at signout    Dispostion:  Dispo pending completion of ED workup, CT, p.o. challenge.  Signed out to [place] MD   Final diagnoses:  Strep pharyngitis    ED Discharge Orders     None      "

## 2024-03-27 NOTE — ED Triage Notes (Signed)
 Patient reports body aches/sore throat/pain swallowing/weakness starting yesterday. Denies fever, N/V/D. Patient ambulatory to room with steady gait. Tonsils swollen in mouth. NAD noted.

## 2024-03-28 ENCOUNTER — Emergency Department (HOSPITAL_BASED_OUTPATIENT_CLINIC_OR_DEPARTMENT_OTHER)

## 2024-03-28 MED ORDER — LIDOCAINE VISCOUS HCL 2 % MT SOLN: 10.0000 mL | Freq: Four times a day (QID) | OROMUCOSAL | 0 refills | Status: AC | PRN

## 2024-03-28 MED ORDER — IOHEXOL 300 MG/ML  SOLN
75.0000 mL | Freq: Once | INTRAMUSCULAR | Status: AC | PRN
  Administered 2024-03-28: 75 mL via INTRAVENOUS

## 2024-03-28 NOTE — ED Provider Notes (Signed)
 I assumed care of this patient from previous provider.  Please see their note for further details of history, exam, and MDM.   Briefly patient is a 29 y.o. female who presented sore throat.  Group A positive.  Getting labs and CT to ensure no airway compromise or evidence of deep tissue infection.  Clinical Course as of 03/28/24 0118  Thu Mar 28, 2024  0117 CT scan without any complications of the tonsillopharyngitis.  On reassessment, patient reported feeling better.   [PC]    Clinical Course User Index [PC] Simren Popson, Raynell Moder, MD   The patient appears reasonably screened and/or stabilized for discharge and I doubt any other medical condition or other [facility] requiring further screening, evaluation, or treatment in the ED at this time. I have discussed the findings, Dx and Tx plan with the patient/family who expressed understanding and agree(s) with the plan. Discharge instructions discussed at length. The patient/family was given strict return precautions who verbalized understanding of the instructions. No further questions at time of discharge.  Disposition: Discharge  Condition: Good  ED Discharge Orders          Ordered    lidocaine  (XYLOCAINE ) 2 % solution  Every 6 hours PRN        03/28/24 0118              Follow Up: Primary care provider  Call  to schedule an appointment for close follow up          Louella Medaglia, Raynell Moder, MD 03/28/24 [phone]

## 2024-03-28 NOTE — ED Notes (Signed)
 Patient transported to CT
